# Patient Record
Sex: Female | Born: 1960 | Race: Black or African American | Hispanic: No | Smoking: Never smoker
Health system: Southern US, Community
[De-identification: ages and names within clinical notes are randomized; demographics above are authoritative.]

## PROBLEM LIST (undated history)

## (undated) DIAGNOSIS — F329 Major depressive disorder, single episode, unspecified: Secondary | ICD-10-CM

## (undated) DIAGNOSIS — M549 Dorsalgia, unspecified: Secondary | ICD-10-CM

## (undated) DIAGNOSIS — R531 Weakness: Secondary | ICD-10-CM

## (undated) DIAGNOSIS — K5900 Constipation, unspecified: Secondary | ICD-10-CM

## (undated) DIAGNOSIS — E785 Hyperlipidemia, unspecified: Secondary | ICD-10-CM

## (undated) DIAGNOSIS — R112 Nausea with vomiting, unspecified: Secondary | ICD-10-CM

## (undated) DIAGNOSIS — K219 Gastro-esophageal reflux disease without esophagitis: Secondary | ICD-10-CM

## (undated) DIAGNOSIS — N390 Urinary tract infection, site not specified: Secondary | ICD-10-CM

## (undated) DIAGNOSIS — Z9889 Other specified postprocedural states: Secondary | ICD-10-CM

## (undated) DIAGNOSIS — M62838 Other muscle spasm: Secondary | ICD-10-CM

## (undated) DIAGNOSIS — G43909 Migraine, unspecified, not intractable, without status migrainosus: Secondary | ICD-10-CM

## (undated) DIAGNOSIS — F32A Depression, unspecified: Secondary | ICD-10-CM

## (undated) DIAGNOSIS — Z8669 Personal history of other diseases of the nervous system and sense organs: Secondary | ICD-10-CM

## (undated) DIAGNOSIS — Z8659 Personal history of other mental and behavioral disorders: Secondary | ICD-10-CM

## (undated) DIAGNOSIS — J302 Other seasonal allergic rhinitis: Secondary | ICD-10-CM

## (undated) DIAGNOSIS — G8929 Other chronic pain: Secondary | ICD-10-CM

## (undated) DIAGNOSIS — G47419 Narcolepsy without cataplexy: Secondary | ICD-10-CM

## (undated) DIAGNOSIS — D649 Anemia, unspecified: Secondary | ICD-10-CM

## (undated) HISTORY — PX: EYE SURGERY: SHX253

## (undated) HISTORY — PX: COLONOSCOPY: SHX174

## (undated) HISTORY — PX: MYOMECTOMY: SHX85

## (undated) HISTORY — PX: OTHER SURGICAL HISTORY: SHX169

## (undated) HISTORY — PX: ABDOMINAL HYSTERECTOMY: SHX81

---

## 1998-05-20 ENCOUNTER — Encounter: Admission: RE | Admit: 1998-05-20 | Discharge: 1998-05-20 | Payer: Self-pay | Admitting: Family Medicine

## 1998-06-13 ENCOUNTER — Encounter: Admission: RE | Admit: 1998-06-13 | Discharge: 1998-06-13 | Payer: Self-pay | Admitting: Family Medicine

## 1998-11-21 ENCOUNTER — Encounter: Admission: RE | Admit: 1998-11-21 | Discharge: 1998-11-21 | Payer: Self-pay | Admitting: Family Medicine

## 1998-12-18 ENCOUNTER — Encounter: Admission: RE | Admit: 1998-12-18 | Discharge: 1998-12-18 | Payer: Self-pay | Admitting: Family Medicine

## 1999-11-10 ENCOUNTER — Encounter: Payer: Self-pay | Admitting: Family Medicine

## 1999-11-10 ENCOUNTER — Encounter: Admission: RE | Admit: 1999-11-10 | Discharge: 1999-11-10 | Payer: Self-pay | Admitting: Family Medicine

## 1999-12-25 ENCOUNTER — Encounter: Payer: Self-pay | Admitting: Family Medicine

## 1999-12-25 ENCOUNTER — Encounter: Admission: RE | Admit: 1999-12-25 | Discharge: 1999-12-25 | Payer: Self-pay | Admitting: Family Medicine

## 2000-05-27 ENCOUNTER — Other Ambulatory Visit: Admission: RE | Admit: 2000-05-27 | Discharge: 2000-05-27 | Payer: Self-pay | Admitting: Family Medicine

## 2000-06-11 ENCOUNTER — Encounter: Payer: Self-pay | Admitting: Family Medicine

## 2000-06-11 ENCOUNTER — Ambulatory Visit (HOSPITAL_COMMUNITY): Admission: RE | Admit: 2000-06-11 | Discharge: 2000-06-11 | Payer: Self-pay | Admitting: Family Medicine

## 2000-11-08 ENCOUNTER — Emergency Department (HOSPITAL_COMMUNITY): Admission: EM | Admit: 2000-11-08 | Discharge: 2000-11-09 | Payer: Self-pay | Admitting: Emergency Medicine

## 2000-11-08 ENCOUNTER — Encounter: Payer: Self-pay | Admitting: Emergency Medicine

## 2001-01-18 ENCOUNTER — Encounter: Payer: Self-pay | Admitting: Internal Medicine

## 2001-01-18 ENCOUNTER — Encounter: Admission: RE | Admit: 2001-01-18 | Discharge: 2001-01-18 | Payer: Self-pay | Admitting: Internal Medicine

## 2001-08-30 ENCOUNTER — Encounter: Payer: Self-pay | Admitting: Family Medicine

## 2001-08-30 ENCOUNTER — Ambulatory Visit (HOSPITAL_COMMUNITY): Admission: RE | Admit: 2001-08-30 | Discharge: 2001-08-30 | Payer: Self-pay | Admitting: Family Medicine

## 2001-12-22 ENCOUNTER — Emergency Department (HOSPITAL_COMMUNITY): Admission: EM | Admit: 2001-12-22 | Discharge: 2001-12-23 | Payer: Self-pay | Admitting: Emergency Medicine

## 2002-01-03 ENCOUNTER — Inpatient Hospital Stay (HOSPITAL_COMMUNITY): Admission: RE | Admit: 2002-01-03 | Discharge: 2002-01-05 | Payer: Self-pay | Admitting: *Deleted

## 2002-01-03 ENCOUNTER — Encounter (INDEPENDENT_AMBULATORY_CARE_PROVIDER_SITE_OTHER): Payer: Self-pay | Admitting: Specialist

## 2003-01-02 ENCOUNTER — Encounter: Admission: RE | Admit: 2003-01-02 | Discharge: 2003-02-08 | Payer: Self-pay | Admitting: Sports Medicine

## 2003-03-14 ENCOUNTER — Other Ambulatory Visit: Admission: RE | Admit: 2003-03-14 | Discharge: 2003-03-14 | Payer: Self-pay | Admitting: *Deleted

## 2003-08-09 ENCOUNTER — Emergency Department (HOSPITAL_COMMUNITY): Admission: AD | Admit: 2003-08-09 | Discharge: 2003-08-09 | Payer: Self-pay | Admitting: Family Medicine

## 2003-08-28 ENCOUNTER — Emergency Department (HOSPITAL_COMMUNITY): Admission: EM | Admit: 2003-08-28 | Discharge: 2003-08-28 | Payer: Self-pay | Admitting: Family Medicine

## 2004-02-14 ENCOUNTER — Emergency Department (HOSPITAL_COMMUNITY): Admission: EM | Admit: 2004-02-14 | Discharge: 2004-02-14 | Payer: Self-pay | Admitting: Emergency Medicine

## 2004-05-06 ENCOUNTER — Emergency Department (HOSPITAL_COMMUNITY): Admission: EM | Admit: 2004-05-06 | Discharge: 2004-05-06 | Payer: Self-pay | Admitting: Family Medicine

## 2004-08-29 ENCOUNTER — Emergency Department (HOSPITAL_COMMUNITY): Admission: EM | Admit: 2004-08-29 | Discharge: 2004-08-29 | Payer: Self-pay | Admitting: Family Medicine

## 2004-12-08 ENCOUNTER — Ambulatory Visit: Payer: Self-pay | Admitting: Internal Medicine

## 2004-12-11 ENCOUNTER — Encounter (INDEPENDENT_AMBULATORY_CARE_PROVIDER_SITE_OTHER): Payer: Self-pay | Admitting: *Deleted

## 2004-12-11 LAB — CONVERTED CEMR LAB: Pap Smear: NORMAL

## 2005-01-08 ENCOUNTER — Ambulatory Visit: Payer: Self-pay | Admitting: Internal Medicine

## 2005-01-09 ENCOUNTER — Ambulatory Visit: Payer: Self-pay | Admitting: Internal Medicine

## 2005-01-15 ENCOUNTER — Ambulatory Visit: Payer: Self-pay | Admitting: Internal Medicine

## 2005-02-09 ENCOUNTER — Emergency Department (HOSPITAL_COMMUNITY): Admission: EM | Admit: 2005-02-09 | Discharge: 2005-02-09 | Payer: Self-pay | Admitting: Family Medicine

## 2005-02-17 ENCOUNTER — Emergency Department (HOSPITAL_COMMUNITY): Admission: EM | Admit: 2005-02-17 | Discharge: 2005-02-17 | Payer: Self-pay | Admitting: Family Medicine

## 2005-03-31 ENCOUNTER — Ambulatory Visit: Payer: Self-pay | Admitting: Internal Medicine

## 2005-04-01 ENCOUNTER — Ambulatory Visit (HOSPITAL_COMMUNITY): Admission: RE | Admit: 2005-04-01 | Discharge: 2005-04-01 | Payer: Self-pay | Admitting: Internal Medicine

## 2005-04-14 ENCOUNTER — Ambulatory Visit: Payer: Self-pay | Admitting: *Deleted

## 2005-05-13 ENCOUNTER — Ambulatory Visit: Payer: Self-pay | Admitting: Internal Medicine

## 2005-05-14 ENCOUNTER — Ambulatory Visit (HOSPITAL_COMMUNITY): Admission: RE | Admit: 2005-05-14 | Discharge: 2005-05-14 | Payer: Self-pay | Admitting: Internal Medicine

## 2005-06-02 ENCOUNTER — Ambulatory Visit (HOSPITAL_COMMUNITY): Admission: RE | Admit: 2005-06-02 | Discharge: 2005-06-02 | Payer: Self-pay | Admitting: Family Medicine

## 2005-06-03 ENCOUNTER — Encounter (INDEPENDENT_AMBULATORY_CARE_PROVIDER_SITE_OTHER): Payer: Self-pay | Admitting: *Deleted

## 2005-10-01 ENCOUNTER — Ambulatory Visit: Payer: Self-pay | Admitting: Internal Medicine

## 2005-10-20 ENCOUNTER — Ambulatory Visit: Payer: Self-pay | Admitting: Internal Medicine

## 2005-10-28 ENCOUNTER — Ambulatory Visit: Payer: Self-pay | Admitting: Internal Medicine

## 2005-11-09 ENCOUNTER — Ambulatory Visit (HOSPITAL_BASED_OUTPATIENT_CLINIC_OR_DEPARTMENT_OTHER): Admission: RE | Admit: 2005-11-09 | Discharge: 2005-11-09 | Payer: Self-pay | Admitting: Internal Medicine

## 2005-11-22 ENCOUNTER — Ambulatory Visit: Payer: Self-pay | Admitting: Internal Medicine

## 2005-12-03 ENCOUNTER — Ambulatory Visit: Payer: Self-pay | Admitting: Internal Medicine

## 2005-12-04 ENCOUNTER — Ambulatory Visit: Payer: Self-pay | Admitting: Hospitalist

## 2005-12-04 ENCOUNTER — Ambulatory Visit (HOSPITAL_COMMUNITY): Admission: RE | Admit: 2005-12-04 | Discharge: 2005-12-04 | Payer: Self-pay | Admitting: Hospitalist

## 2005-12-18 ENCOUNTER — Ambulatory Visit: Payer: Self-pay | Admitting: Internal Medicine

## 2005-12-22 ENCOUNTER — Ambulatory Visit (HOSPITAL_COMMUNITY): Admission: RE | Admit: 2005-12-22 | Discharge: 2005-12-22 | Payer: Self-pay | Admitting: Hospitalist

## 2006-01-13 ENCOUNTER — Ambulatory Visit: Payer: Self-pay | Admitting: Internal Medicine

## 2006-02-08 ENCOUNTER — Encounter (INDEPENDENT_AMBULATORY_CARE_PROVIDER_SITE_OTHER): Payer: Self-pay | Admitting: *Deleted

## 2006-02-24 ENCOUNTER — Encounter: Payer: Self-pay | Admitting: Obstetrics and Gynecology

## 2006-02-24 ENCOUNTER — Ambulatory Visit: Payer: Self-pay | Admitting: Obstetrics and Gynecology

## 2006-02-24 ENCOUNTER — Encounter (INDEPENDENT_AMBULATORY_CARE_PROVIDER_SITE_OTHER): Payer: Self-pay | Admitting: *Deleted

## 2006-02-26 DIAGNOSIS — S0230XB Fracture of orbital floor, unspecified side, initial encounter for open fracture: Secondary | ICD-10-CM

## 2006-02-26 DIAGNOSIS — M26609 Unspecified temporomandibular joint disorder, unspecified side: Secondary | ICD-10-CM

## 2006-02-26 DIAGNOSIS — G43909 Migraine, unspecified, not intractable, without status migrainosus: Secondary | ICD-10-CM

## 2006-02-26 DIAGNOSIS — Z8659 Personal history of other mental and behavioral disorders: Secondary | ICD-10-CM | POA: Insufficient documentation

## 2006-02-26 DIAGNOSIS — D259 Leiomyoma of uterus, unspecified: Secondary | ICD-10-CM | POA: Insufficient documentation

## 2006-02-26 DIAGNOSIS — G47419 Narcolepsy without cataplexy: Secondary | ICD-10-CM | POA: Insufficient documentation

## 2006-02-26 DIAGNOSIS — K5900 Constipation, unspecified: Secondary | ICD-10-CM | POA: Insufficient documentation

## 2006-02-26 HISTORY — DX: Leiomyoma of uterus, unspecified: D25.9

## 2006-02-26 HISTORY — DX: Narcolepsy without cataplexy: G47.419

## 2006-02-26 HISTORY — DX: Migraine, unspecified, not intractable, without status migrainosus: G43.909

## 2006-02-26 HISTORY — DX: Fracture of orbital floor, unspecified side, initial encounter for open fracture: S02.30XB

## 2006-02-26 HISTORY — DX: Unspecified temporomandibular joint disorder, unspecified side: M26.609

## 2006-03-11 ENCOUNTER — Ambulatory Visit: Payer: Self-pay | Admitting: Internal Medicine

## 2006-04-12 ENCOUNTER — Ambulatory Visit (HOSPITAL_COMMUNITY): Admission: RE | Admit: 2006-04-12 | Discharge: 2006-04-12 | Payer: Self-pay | Admitting: Obstetrics and Gynecology

## 2006-04-12 ENCOUNTER — Ambulatory Visit: Payer: Self-pay | Admitting: Obstetrics and Gynecology

## 2006-06-14 ENCOUNTER — Ambulatory Visit: Payer: Self-pay | Admitting: Internal Medicine

## 2006-06-14 DIAGNOSIS — N946 Dysmenorrhea, unspecified: Secondary | ICD-10-CM | POA: Insufficient documentation

## 2006-06-14 DIAGNOSIS — J309 Allergic rhinitis, unspecified: Secondary | ICD-10-CM

## 2006-06-14 HISTORY — DX: Allergic rhinitis, unspecified: J30.9

## 2006-06-14 HISTORY — DX: Dysmenorrhea, unspecified: N94.6

## 2006-06-29 ENCOUNTER — Emergency Department (HOSPITAL_COMMUNITY): Admission: EM | Admit: 2006-06-29 | Discharge: 2006-06-29 | Payer: Self-pay | Admitting: Family Medicine

## 2007-01-18 ENCOUNTER — Emergency Department (HOSPITAL_COMMUNITY): Admission: EM | Admit: 2007-01-18 | Discharge: 2007-01-18 | Payer: Self-pay | Admitting: Emergency Medicine

## 2007-01-21 ENCOUNTER — Emergency Department (HOSPITAL_COMMUNITY): Admission: EM | Admit: 2007-01-21 | Discharge: 2007-01-21 | Payer: Self-pay | Admitting: Emergency Medicine

## 2007-03-02 ENCOUNTER — Ambulatory Visit: Payer: Self-pay | Admitting: Hospitalist

## 2007-03-02 DIAGNOSIS — R51 Headache: Secondary | ICD-10-CM | POA: Insufficient documentation

## 2007-03-02 DIAGNOSIS — R519 Headache, unspecified: Secondary | ICD-10-CM

## 2007-03-02 HISTORY — DX: Headache, unspecified: R51.9

## 2007-03-17 ENCOUNTER — Emergency Department (HOSPITAL_COMMUNITY): Admission: EM | Admit: 2007-03-17 | Discharge: 2007-03-17 | Payer: Self-pay | Admitting: Emergency Medicine

## 2007-04-14 ENCOUNTER — Ambulatory Visit: Payer: Self-pay | Admitting: Internal Medicine

## 2007-04-14 ENCOUNTER — Encounter (INDEPENDENT_AMBULATORY_CARE_PROVIDER_SITE_OTHER): Payer: Self-pay | Admitting: Internal Medicine

## 2007-04-14 DIAGNOSIS — IMO0001 Reserved for inherently not codable concepts without codable children: Secondary | ICD-10-CM | POA: Insufficient documentation

## 2007-04-14 DIAGNOSIS — E042 Nontoxic multinodular goiter: Secondary | ICD-10-CM | POA: Insufficient documentation

## 2007-04-14 HISTORY — DX: Nontoxic multinodular goiter: E04.2

## 2007-04-14 LAB — CONVERTED CEMR LAB
Anti Nuclear Antibody(ANA): NEGATIVE
Rheumatoid fact SerPl-aCnc: <20 intl units/mL (ref 0–20)
TSH: 1.177 microintl units/mL (ref 0.350–5.50)
Total CK: 149 units/L (ref 7–177)

## 2007-05-04 ENCOUNTER — Encounter (INDEPENDENT_AMBULATORY_CARE_PROVIDER_SITE_OTHER): Payer: Self-pay | Admitting: Internal Medicine

## 2007-05-04 ENCOUNTER — Ambulatory Visit: Payer: Self-pay | Admitting: Hospitalist

## 2007-05-04 DIAGNOSIS — N39498 Other specified urinary incontinence: Secondary | ICD-10-CM | POA: Insufficient documentation

## 2007-05-04 HISTORY — DX: Other specified urinary incontinence: N39.498

## 2007-05-04 LAB — CONVERTED CEMR LAB
Bilirubin Urine: NEGATIVE
Hemoglobin, Urine: NEGATIVE
Ketones, ur: NEGATIVE mg/dL
Leukocytes, UA: NEGATIVE
Nitrite: NEGATIVE
Protein, ur: NEGATIVE mg/dL
Specific Gravity, Urine: 1.021 (ref 1.005–1.03)
Urine Glucose: NEGATIVE mg/dL
Urobilinogen, UA: 0.2 (ref 0.0–1.0)
pH: 7 (ref 5.0–8.0)

## 2007-05-10 ENCOUNTER — Encounter (HOSPITAL_COMMUNITY): Admission: RE | Admit: 2007-05-10 | Discharge: 2007-08-08 | Payer: Self-pay | Admitting: Internal Medicine

## 2007-05-25 ENCOUNTER — Ambulatory Visit: Payer: Self-pay | Admitting: Obstetrics and Gynecology

## 2007-05-25 ENCOUNTER — Ambulatory Visit: Payer: Self-pay | Admitting: Internal Medicine

## 2007-05-25 DIAGNOSIS — R498 Other voice and resonance disorders: Secondary | ICD-10-CM

## 2007-05-25 HISTORY — DX: Other voice and resonance disorders: R49.8

## 2007-05-27 ENCOUNTER — Telehealth: Payer: Self-pay | Admitting: *Deleted

## 2007-05-30 ENCOUNTER — Ambulatory Visit (HOSPITAL_COMMUNITY): Admission: RE | Admit: 2007-05-30 | Discharge: 2007-05-30 | Payer: Self-pay | Admitting: Internal Medicine

## 2007-06-02 ENCOUNTER — Ambulatory Visit: Payer: Self-pay | Admitting: Internal Medicine

## 2007-06-07 ENCOUNTER — Ambulatory Visit (HOSPITAL_COMMUNITY): Admission: RE | Admit: 2007-06-07 | Discharge: 2007-06-07 | Payer: Self-pay | Admitting: Internal Medicine

## 2007-06-07 ENCOUNTER — Encounter (INDEPENDENT_AMBULATORY_CARE_PROVIDER_SITE_OTHER): Payer: Self-pay | Admitting: Interventional Radiology

## 2007-06-24 ENCOUNTER — Telehealth (INDEPENDENT_AMBULATORY_CARE_PROVIDER_SITE_OTHER): Payer: Self-pay | Admitting: *Deleted

## 2007-07-20 ENCOUNTER — Ambulatory Visit: Payer: Self-pay | Admitting: Hospitalist

## 2007-07-20 DIAGNOSIS — J069 Acute upper respiratory infection, unspecified: Secondary | ICD-10-CM

## 2007-07-20 HISTORY — DX: Acute upper respiratory infection, unspecified: J06.9

## 2007-10-12 ENCOUNTER — Telehealth (INDEPENDENT_AMBULATORY_CARE_PROVIDER_SITE_OTHER): Payer: Self-pay | Admitting: *Deleted

## 2007-10-21 ENCOUNTER — Ambulatory Visit (HOSPITAL_COMMUNITY): Admission: RE | Admit: 2007-10-21 | Discharge: 2007-10-21 | Payer: Self-pay | Admitting: Family Medicine

## 2007-11-07 ENCOUNTER — Other Ambulatory Visit: Admission: RE | Admit: 2007-11-07 | Discharge: 2007-11-07 | Payer: Self-pay | Admitting: Obstetrics and Gynecology

## 2008-10-11 ENCOUNTER — Encounter (INDEPENDENT_AMBULATORY_CARE_PROVIDER_SITE_OTHER): Payer: Self-pay | Admitting: Obstetrics and Gynecology

## 2008-10-11 ENCOUNTER — Inpatient Hospital Stay (HOSPITAL_COMMUNITY): Admission: RE | Admit: 2008-10-11 | Discharge: 2008-10-14 | Payer: Self-pay | Admitting: Obstetrics and Gynecology

## 2008-10-18 ENCOUNTER — Telehealth: Payer: Self-pay | Admitting: *Deleted

## 2008-10-24 ENCOUNTER — Inpatient Hospital Stay (HOSPITAL_COMMUNITY): Admission: AD | Admit: 2008-10-24 | Discharge: 2008-10-26 | Payer: Self-pay | Admitting: Obstetrics and Gynecology

## 2008-10-24 ENCOUNTER — Encounter: Admission: RE | Admit: 2008-10-24 | Discharge: 2008-10-24 | Payer: Self-pay | Admitting: Obstetrics and Gynecology

## 2008-10-25 ENCOUNTER — Ambulatory Visit (HOSPITAL_COMMUNITY): Admission: RE | Admit: 2008-10-25 | Discharge: 2008-10-25 | Payer: Self-pay | Admitting: Obstetrics and Gynecology

## 2008-10-25 ENCOUNTER — Other Ambulatory Visit: Payer: Self-pay | Admitting: Obstetrics and Gynecology

## 2008-10-30 ENCOUNTER — Telehealth (INDEPENDENT_AMBULATORY_CARE_PROVIDER_SITE_OTHER): Payer: Self-pay | Admitting: *Deleted

## 2008-11-19 ENCOUNTER — Ambulatory Visit (HOSPITAL_COMMUNITY): Admission: RE | Admit: 2008-11-19 | Discharge: 2008-11-19 | Payer: Self-pay | Admitting: Family Medicine

## 2009-11-28 ENCOUNTER — Ambulatory Visit (HOSPITAL_COMMUNITY): Admission: RE | Admit: 2009-11-28 | Discharge: 2009-11-28 | Payer: Self-pay | Admitting: Family Medicine

## 2009-12-20 ENCOUNTER — Emergency Department (HOSPITAL_COMMUNITY): Admission: EM | Admit: 2009-12-20 | Discharge: 2009-12-20 | Payer: Self-pay | Admitting: Emergency Medicine

## 2010-06-03 NOTE — Progress Notes (Signed)
Summary: refill/ hla  Phone Note Refill Request Message from:  Fax from Pharmacy on October 18, 2008 3:02 PM  Refills Requested: Medication #1:  AMBIEN 5 MG  TABS take one tablet each night before you go to sleep..   Last Refilled: 8/26 Initial call taken by: Marin Roberts RN,  October 18, 2008 3:03 PM  Follow-up for Phone Call        Rx denied because patient has not been seen here in over a year.  She will need an appointment. Follow-up by: Margarito Liner MD,  October 18, 2008 3:45 PM  Additional Follow-up for Phone Call Additional follow up Details #1::        Rx denial called/faxed to pharmacy Additional Follow-up by: Marin Roberts RN,  October 22, 2008 8:48 AM

## 2010-06-03 NOTE — Progress Notes (Signed)
Summary: RECORDS  Phone Note Call from Patient   Caller: Patient Summary of Call: CALLING IN RE: TO RECORDS BEING COPIED.  SENT [facility] OVER FAX LAST WEEK.  NEEDS TO KNOW IF RECORDS HAVE BEEN COPIED AS SHE NEEDS THEM FOR ANOTHER MD APPT.  ADVISED MESSAGE WOULD BE SENT TO [facility]  PT CAN BE CONTACTED AT [phone] Initial call taken by: Eather Colas PATE CMA,,  October 12, 2007 2:00 PM  Follow-up for Phone Call        I never received an ROI as I have not been the only one pulling faxes. Pt needs to have the MD office send ROI Follow-up by: Knox Royalty,  October 18, 2007 9:35 AM         Appended Document: RECORDS copied by [facility] on 6/23

## 2010-06-03 NOTE — Progress Notes (Signed)
Summary: med refill/gp  Phone Note Refill Request Message from:  Fax from Pharmacy on October 30, 2008 10:53 AM  Refills Requested: Medication #1:  AMBIEN 5 MG  TABS take one tablet each night before you go to sleep..   Last Refilled: 12/28/2007  Method Requested: Telephone to Pharmacy Initial call taken by: Chinita Pester RN,  October 30, 2008 10:53 AM  Follow-up for Phone Call        Refill approved-nurse to complete Follow-up by: Rufina Falco      Prescriptions: AMBIEN 5 MG  TABS (ZOLPIDEM TARTRATE) take one tablet each night before you go to sleep.  #30 x 1   Entered and Authorized by:   Rufina Falco MD   Signed by:   Rufina Falco MD on 10/30/2008   Method used:   Telephoned to ...       [facility]  [address] [facility]       [address]       [place], [place]  [address]       Ph: [phone]       Fax: [phone]   RxID:   [id number]   Appended Document: med refill/gp Above Rx faxed to [facility] pharmacy.
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
## 2010-06-03 NOTE — Assessment & Plan Note (Signed)
Summary: FU/ SB.   Vital Signs:  Patient Profile:   50 Years Old Female Height:     67 inches (170.18 cm) Weight:      155.1 pounds Temp:     98.4 degrees F oral Pulse rate:   72 / minute BP sitting:   105 / 66  (right arm)  Pt. in pain?   yes    Location:   back    Intensity:   4    Type:       aching  Vitals Entered ByFilomena Jungling (May 04, 2007 10:55 AM)              Is Patient Diabetic? No  Does patient need assistance? Functional Status Self care Ambulation Normal     PCP:  Rufina Falco MD  Chief Complaint:  FOLLOW-UP VISIT.  History of Present Illness: Here today for f/u on her goiter. Euthyroid by thyroid function tests. Here to discuss further management. Has had minor difficulties with swallowing. For the past 2 months has been experiencing urinary incontinence with laughing and singing. Also has dysuria.  Current Allergies: No known allergies     Risk Factors: Tobacco use:  quit    Year quit:  2007 Alcohol use:  yes    Type:  BEER, LIQUOR/ AT TIMES Exercise:  no Seatbelt use:  100 %  PAP Smear History:    Date of Last PAP Smear:  12/11/2004   Review of Systems  The patient denies fever, weight loss, chest pain, dyspnea on exhertion, abdominal pain, melena, hematochezia, and severe indigestion/heartburn.     Physical Exam  General:     Well-developed,well-nourished,in no acute distress; alert,appropriate and cooperative throughout examination Eyes:     No corneal or conjunctival inflammation noted. EOMI. Perrla.  Vision grossly normal. Neck:     No bruits. Large goiter. Lungs:     Normal respiratory effort, chest expands symmetrically. Lungs are clear to auscultation, no crackles or wheezes. Heart:     Normal rate and regular rhythm. S1 and S2 normal without gallop, murmur, click, rub or other extra sounds. Extremities:     No clubbing, cyanosis, edema.    Impression & Recommendations:  Problem # 1:  STRESS INCONTINENCE  (ICD-788.39) Will check a U/A first as this may be a sign of a UTI.  Orders: T-Urinalysis ([id number]) T-Culture, Urine ([id number])   Problem # 2:  NONTOXIC MULTINODULAR GOITER (ICD-241.1) FNA is not the next best step as she has at least 5 nodules noted on CT scan. Euthyroid. Will proceed to thyroid scan to r/o any cold nodules. Will also follow closely in regards to symptomes because if she develops any compressing symptoms will need to refer urgently to surgery. Airway patent on CT scan in 11/08.  Future Orders: Nuclear Medicine (Nuclear Medicine) ... 05/10/2007   Complete Medication List: 1)  Imitrex 50 Mg Tabs (Sumatriptan succinate) .... Take one tablet as needed with a maxmum dose of 2 pills every day 2)  Flexeril 5 Mg Tabs (Cyclobenzaprine hcl) .... Take 1 tab by mouth at bedtime   Patient Instructions: 1)  Please schedule a follow-up appointment in 1 month.    ]
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
## 2010-06-03 NOTE — Progress Notes (Signed)
Summary: refill/gg    Amber Riley  Phone Note Call from Patient   Caller: Patient Summary of Call: Pt called and would like Rx  for sleeping pill. please advise Initial call taken by: Merrie Roof RN,  June 24, 2007 3:00 PM  Follow-up for Phone Call        Please call in a prescription for Ambien 5 mg by mouth q hs.  #30 1 refill Follow-up by: Rufina Falco    New/Updated Medications: AMBIEN 5 MG  TABS (ZOLPIDEM TARTRATE) take one tablet each night before you go to sleep.   Prescriptions: AMBIEN 5 MG  TABS (ZOLPIDEM TARTRATE) take one tablet each night before you go to sleep.  #30 x 1   Entered and Authorized by:   Rufina Falco MD   Signed by:   Rufina Falco MD on 06/28/2007   Method used:   Telephoned to ...         RxID:   [id number]     Appended Document: refill/gg    [name] med called into [facility]
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
## 2010-06-03 NOTE — Progress Notes (Signed)
----   Converted from flag ---- ---- 05/27/2007 9:18 AM, Chinita Pester RN wrote: Talked to pt. this morning and explain Amber Riley in radiology stated there's a review that needs to be done prior to scheduling her  an appt.  ---- 05/26/2007 5:25 PM, Merrie Roof RN wrote: Please call Tennile at [phone]. She has questions about when her Bx will be done. Thanks, Inocencio Homes ------------------------------

## 2010-07-29 ENCOUNTER — Emergency Department (HOSPITAL_COMMUNITY)
Admission: EM | Admit: 2010-07-29 | Discharge: 2010-07-30 | Disposition: A | Discharge status: Home / Self Care | Payer: BC Managed Care – PPO | Attending: Emergency Medicine | Admitting: Emergency Medicine

## 2010-07-29 ENCOUNTER — Emergency Department (HOSPITAL_COMMUNITY): Payer: BC Managed Care – PPO

## 2010-07-29 DIAGNOSIS — R079 Chest pain, unspecified: Secondary | ICD-10-CM | POA: Insufficient documentation

## 2010-07-29 LAB — DIFFERENTIAL
Basophils Absolute: 0 10*3/uL (ref 0.0–0.1)
Basophils Relative: 1 % (ref 0–1)
Eosinophils Absolute: 0.1 10*3/uL (ref 0.0–0.7)
Eosinophils Relative: 1 % (ref 0–5)
Lymphocytes Relative: 68 % — ABNORMAL HIGH (ref 12–46)
Lymphs Abs: 4.7 10*3/uL — ABNORMAL HIGH (ref 0.7–4.0)
Monocytes Absolute: 0.4 10*3/uL (ref 0.1–1.0)
Monocytes Relative: 6 % (ref 3–12)
Neutro Abs: 1.7 10*3/uL (ref 1.7–7.7)
Neutrophils Relative %: 25 % — ABNORMAL LOW (ref 43–77)

## 2010-07-29 LAB — CBC
HCT: 43.5 % (ref 36.0–46.0)
Hemoglobin: 14.2 g/dL (ref 12.0–15.0)
MCH: 28.3 pg (ref 26.0–34.0)
MCHC: 32.6 g/dL (ref 30.0–36.0)
MCV: 86.7 fL (ref 78.0–100.0)
Platelets: 205 10*3/uL (ref 150–400)
RBC: 5.02 MIL/uL (ref 3.87–5.11)
RDW: 12.7 % (ref 11.5–15.5)
WBC: 6.9 10*3/uL (ref 4.0–10.5)

## 2010-07-29 LAB — POCT I-STAT, CHEM 8
BUN: 16 mg/dL (ref 6–23)
Calcium, Ion: 1.17 mmol/L (ref 1.12–1.32)
Chloride: 105 mEq/L (ref 96–112)
Creatinine, Ser: 1.1 mg/dL (ref 0.4–1.2)
Glucose, Bld: 88 mg/dL (ref 70–99)
HCT: 46 % (ref 36.0–46.0)
Hemoglobin: 15.6 g/dL — ABNORMAL HIGH (ref 12.0–15.0)
Potassium: 5 mEq/L (ref 3.5–5.1)
Sodium: 137 mEq/L (ref 135–145)
TCO2: 24 mmol/L (ref 0–100)

## 2010-07-29 LAB — POCT CARDIAC MARKERS
CKMB, poc: 2 ng/mL (ref 1.0–8.0)
Myoglobin, poc: 85.1 ng/mL (ref 12–200)
Troponin i, poc: <0.05 ng/mL (ref 0.00–0.09)

## 2010-08-11 LAB — CBC
HCT: 34.6 % — ABNORMAL LOW (ref 36.0–46.0)
HCT: 30.1 % — ABNORMAL LOW (ref 36.0–46.0)
HCT: 37.5 % (ref 36.0–46.0)
Hemoglobin: 11.4 g/dL — ABNORMAL LOW (ref 12.0–15.0)
Hemoglobin: 10.3 g/dL — ABNORMAL LOW (ref 12.0–15.0)
Hemoglobin: 12.7 g/dL (ref 12.0–15.0)
MCHC: 32.8 g/dL (ref 30.0–36.0)
MCHC: 33.7 g/dL (ref 30.0–36.0)
MCHC: 34.2 g/dL (ref 30.0–36.0)
MCV: 86.6 fL (ref 78.0–100.0)
MCV: 86.4 fL (ref 78.0–100.0)
MCV: 86.9 fL (ref 78.0–100.0)
Platelets: 390 10*3/uL (ref 150–400)
Platelets: 159 10*3/uL (ref 150–400)
Platelets: 204 10*3/uL (ref 150–400)
RBC: 3.99 MIL/uL (ref 3.87–5.11)
RBC: 3.49 MIL/uL — ABNORMAL LOW (ref 3.87–5.11)
RBC: 4.32 MIL/uL (ref 3.87–5.11)
RDW: 14.9 % (ref 11.5–15.5)
RDW: 15.2 % (ref 11.5–15.5)
RDW: 15.4 % (ref 11.5–15.5)
WBC: 10.1 10*3/uL (ref 4.0–10.5)
WBC: 11.5 10*3/uL — ABNORMAL HIGH (ref 4.0–10.5)
WBC: 5 10*3/uL (ref 4.0–10.5)

## 2010-08-11 LAB — DIFFERENTIAL
Basophils Absolute: 0.1 10*3/uL (ref 0.0–0.1)
Basophils Relative: 1 % (ref 0–1)
Eosinophils Absolute: 0.1 10*3/uL (ref 0.0–0.7)
Eosinophils Relative: 1 % (ref 0–5)
Lymphocytes Relative: 27 % (ref 12–46)
Lymphs Abs: 2.7 10*3/uL (ref 0.7–4.0)
Monocytes Absolute: 0.3 10*3/uL (ref 0.1–1.0)
Monocytes Relative: 3 % (ref 3–12)
Neutro Abs: 6.9 10*3/uL (ref 1.7–7.7)
Neutrophils Relative %: 69 % (ref 43–77)

## 2010-08-11 LAB — BASIC METABOLIC PANEL
BUN: 8 mg/dL (ref 6–23)
CO2: 26 mEq/L (ref 19–32)
Calcium: 9 mg/dL (ref 8.4–10.5)
Chloride: 108 mEq/L (ref 96–112)
Creatinine, Ser: 0.9 mg/dL (ref 0.4–1.2)
GFR calc Af Amer: >60 mL/min (ref >60)
GFR calc non Af Amer: >60 mL/min (ref >60)
Glucose, Bld: 71 mg/dL (ref 70–99)
Potassium: 3.4 mEq/L — ABNORMAL LOW (ref 3.5–5.1)
Sodium: 139 mEq/L (ref 135–145)

## 2010-08-11 LAB — CULTURE, BLOOD (ROUTINE X 2)
Culture: NO GROWTH
Culture: NO GROWTH

## 2010-08-11 LAB — ANAEROBIC CULTURE

## 2010-08-11 LAB — TYPE AND SCREEN
ABO/RH(D): O POS
Antibody Screen: NEGATIVE

## 2010-08-11 LAB — URINALYSIS, ROUTINE W REFLEX MICROSCOPIC
Bilirubin Urine: NEGATIVE
Glucose, UA: NEGATIVE mg/dL
Hgb urine dipstick: NEGATIVE
Ketones, ur: NEGATIVE mg/dL
Nitrite: NEGATIVE
Protein, ur: NEGATIVE mg/dL
Specific Gravity, Urine: 1.015 (ref 1.005–1.030)
Urobilinogen, UA: 0.2 mg/dL (ref 0.0–1.0)
pH: 6.5 (ref 5.0–8.0)

## 2010-08-11 LAB — APTT: aPTT: 38 seconds — ABNORMAL HIGH (ref 24–37)

## 2010-08-11 LAB — PROTIME-INR
INR: 1.1 (ref 0.00–1.49)
Prothrombin Time: 14.4 seconds (ref 11.6–15.2)

## 2010-08-11 LAB — PREGNANCY, URINE: Preg Test, Ur: NEGATIVE

## 2010-08-11 LAB — CULTURE, ROUTINE-ABSCESS: Culture: NO GROWTH

## 2010-08-11 LAB — ABO/RH: ABO/RH(D): O POS

## 2010-09-16 NOTE — Discharge Summary (Signed)
Amber Riley, HIRD NO.:  [id number]   MEDICAL RECORD NO.:  [id number]          PATIENT TYPE:  WOC   LOCATION:  WOC                          FACILITY:  [facility]   PHYSICIAN:  Maylon Cos, C.N.M. DATE OF BIRTH:  [birth year]   DATE OF ADMISSION:  05/25/2007  DATE OF DISCHARGE:                               DISCHARGE SUMMARY   The patient is an African American female, age 50, who presents with  complaints of discharge and leaking urine.  The patient has a long-  standing history of chronic bacterial vaginosis and feels that this is  what she has again today. As a new complaint, the patient has a  complaint of some stress incontinence.  She says it does not happen on a  weekly or monthly basis.  The last time it occurred was around the  holidays, when if she coughed too hard or laughed too much, she will  leak urine, even after recent urination.  The patient's exam today is  problem focused.  Vital signs are stable.  Her temperature is 97.9, her  pulse is 92, blood pressure is 107/67.  Her weight today is 156.8 and  height is 5 feet 6 inches.  She is not allergic to any medications.  Her  last menstrual period was May 17, 2007.  She reports regular  periods.  Her last Pap smear was in October of 2007 and it was normal.  The patient is a well-nourished, African American female who appears to  be her stated age.  SPECULUM EXAM:  She had a large amount of yellow, creamy discharge in  the vault, which was obtained for a wet prep.  The cervix is smooth  without lesions.  EXTERNAL GENITALIA:  Without lesions.  She is a Tanner 5.  There is no  evidence of cystocele or rectocele.  The patient has good tone, regular  rugae.   ASSESSMENT AND PLAN:  Wet prep revealed the patient does have another  case of bacterial vaginosis.  She was given a prescription for  metronidazole gel to use 1 applicator full nightly times 7 nights,  monthly at her periods times 3 months.  The  patient's urine analysis  indicated that there could be evidence of a urinary tract infection and  the urine was sent for culture.  The patient is asymptomatic for pain or  burning with urination, so we are not treating her with antibiotics at  this time.  However, culture was obtained and sent.  Health maintenance-  wise, the patient is to return in 3 months to re-evaluate her progress  with the new regimen of medication dosing and also to perform her annual  exam and Pap smear.  She is without insurance coverage at this time, so  she was referred to a family planning waiver at the [employer] and also made aware of the scholarship program for mammograms  and the free cervical cancer screening that will be happening on  February 16 at the [facility].  For stress incontinence, the  patient does seem to have a very mild case of stress  incontinence.  Urine culture was sent to rule out infection and the patient was given  instructions on Kegel exercises to help strengthen pelvic floor muscles.      Maylon Cos, C.N.M.     SS/MEDQ  D:  05/25/2007  T:  05/25/2007  Job:  [id number]

## 2010-09-16 NOTE — Discharge Summary (Signed)
Amber Riley, Amber Riley NO.:  [id number]   MEDICAL RECORD NO.:  [id number]          PATIENT TYPE:  INP   LOCATION:  9306                          [facility]ILITY:  [facility]   PHYSICIAN:  Gerald Leitz, MD          DATE OF BIRTH:  1960-08-02   DATE OF ADMISSION:  10/24/2008  DATE OF DISCHARGE:  10/26/2008                               DISCHARGE SUMMARY   ADMISSION DIAGNOSES:  1. Postoperative fever.  2. Pelvic hematoma versus abscess.   DISCHARGE DIAGNOSES:  1. Pelvic hematoma versus abscess.  2. Status post transgluteal drain placement.   BRIEF HOSPITAL COURSE:  This is a 50 year old who underwent total  abdominal hysterectomy, left salpingo-oophorectomy, lysis of adhesions,  repair of colonic postoperative defect on October 14, 2008.  She presented  to my office with a temperature of 100.4, had a CT scan performed that  showed suspicion for postoperative abscess, a transgluteal drain was  placed on October 25, 2008, by Interventional Radiology.  She was given  prophylactic dose of Flagyl upon admission to the hospital.  Aerobic and  anaerobic cultures were obtained during drain placement.  She did well,  status post drain placement, and was discharged home on hospital day #3,  which is October 26, 2008, with improvement of her pain with defecation.   She was discharged home on the following medications, Cipro, Flagyl,  Motrin, and Vicodin for pain.  She is to follow up on October 30, 2008, at  9:45 a.m. with Dr. Richardson Dopp.  Home health was arranged to give instructions  on management of her transgluteal drain.   CONDITION ON DISCHARGE:  Stable and improved.      Gerald Leitz, MD  Electronically Signed     TC/MEDQ  D:  11/07/2008  T:  11/08/2008  Job:  [id number]

## 2010-09-16 NOTE — Discharge Summary (Signed)
NAMEADDALINE, Amber Riley NO.:  [id number]   MEDICAL RECORD NO.:  [id number]          PATIENT TYPE:  INP   LOCATION:  9302                          [facility]   PHYSICIAN:  Gerald Leitz, MD          DATE OF BIRTH:  11-29-1960   DATE OF ADMISSION:  10/11/2008  DATE OF DISCHARGE:  10/14/2008                               DISCHARGE SUMMARY   ADMISSION DIAGNOSES:  1. Menorrhagia.  2. Uterine fibroids.   DISCHARGE DIAGNOSES:  1. Menorrhagia.  2. Uterine fibroids.  3. Pelvic adhesive disease, status post total abdominal hysterectomy,      left salpingo-oophorectomy, extensive lysis of adhesions, repair of      colonic serosal defect.   BRIEF HOSPITAL COURSE:  The patient was admitted on October 11, 2008, after  undergoing a total abdominal hysterectomy, left salpingo-oophorectomy,  extensive lysis of adhesions, repair of colonic serosal defect.  She did  well postoperatively.  Hemoglobin on postop day #1 was 10.3.  She is  discharged in stable condition with return of her bowel function,  tolerating regular diet, voiding without difficulty.  She is discharged  home the following medications Motrin and Percocet.   She will follow up in October 15, 2008, for staple removal and in 2 weeks  for postoperative visit.   CONDITION ON DISCHARGE:  Stable and improved.      Gerald Leitz, MD  Electronically Signed     TC/MEDQ  D:  10/14/2008  T:  10/14/2008  Job:  [id number]

## 2010-09-16 NOTE — Discharge Summary (Signed)
Amber Riley, Amber Riley NO.:  [id number]   MEDICAL RECORD NO.:  [id number]          PATIENT TYPE:  INP   LOCATION:  9302                          FACILITY:  [facility]   PHYSICIAN:  Gerald Leitz, MD          DATE OF BIRTH:  May 03, 1961   DATE OF ADMISSION:  10/11/2008  DATE OF DISCHARGE:  10/14/2008                               DISCHARGE SUMMARY   ADMISSION DIAGNOSES:  1. Menorrhagia.  2. Uterine fibroids.   DISCHARGE DIAGNOSES:  1. Menorrhagia.  2. Uterine fibroids.  3. Pelvic adhesive disease.  4. Status post total abdominal hysterectomy, left salpingo-      oophorectomy, extensive lysis of adhesions, and repair of colonic      serosal defect.   BRIEF HOSPITAL COURSE:  The patient was admitted on October 11, 2008, after  undergoing a total abdominal hysterectomy, left salpingo-oophorectomy  and lysis of adhesions, and repair of colonic serosal defect.  She did  well postoperatively.  Hemoglobin on postop day #1 was 10.3.  She had  slow return of her bowel function, but was discharged home on postop day  #3 with positive flatus.  She was discharged home on the following  medications, Motrin and Percocet.  She was advised no driving for 2  weeks, no lifting for 6 weeks, no sexual activity for 6 weeks.  She will  follow up for staple removal on October 15, 2008, at a 2-week postoperative  visit.   CONDITION AT DISCHARGE:  Stable and improved.      Gerald Leitz, MD  Electronically Signed     TC/MEDQ  D:  11/07/2008  T:  11/08/2008  Job:  [id number]

## 2010-09-16 NOTE — Op Note (Signed)
Amber Riley, Amber Riley NO.:  [id number]   MEDICAL RECORD NO.:  [id number]          PATIENT TYPE:  INP   LOCATION:  9302                          [facility]ACILITY[facility]   PHYSICIAN:  Gerald Leitz, MD          DATE OF BIRTH:  [birth year]   DATE OF PROCEDURE:  10/11/2008  DATE OF DISCHARGE:                               OPERATIVE REPORT   PREOPERATIVE DIAGNOSES:  1. Menorrhagia.  2. Uterine fibroids.   POSTOPERATIVE DIAGNOSES:  1. Menorrhagia.  2. Uterine fibroids.  3. Pelvic adhesive disease.   PROCEDURE:  Total abdominal hysterectomy, left salpingo-oophorectomy  with extensive lysis of adhesions and repair of colonic serosal defect.   SURGEON:  Gerald Leitz, MD   ASSISTANT:  Bing Neighbors. Delcambre, MD   ANESTHESIA:  General.   FINDINGS:  Pelvic adhesive disease with adhesion of the left ovary to  the sigmoid colon and uterus, areas of endometriosis were noted.  Right  ovary and fallopian tube appeared normal.   SPECIMEN:  Uterus, cervix, and left fallopian tube and ovary.   DISPOSITION:  Specimen to Pathology.   SPECIMEN:  300 mL.   URINE OUTPUT:  375 mL.   COMPLICATIONS:  None.   DESCRIPTION OF PROCEDURE:  Informed consent was obtained, the patient  was taken to the operating room where she was placed under general  anesthesia.  She was prepped and draped in the usual sterile fashion.  A  Pfannenstiel skin incision was made with a scalpel, carried down to the  underlying layer of fascia.  The fascia was incised in the midline and  the incision was extended laterally with Mayo scissors.  The superior  aspect of the fascial incision was grasped with Kocher clamps, elevated,  and the underlying rectus muscle dissected off with Mayo scissors.  This  was repeated on the inferior aspect of the fascial incision.  The rectus  muscles were separated in the midline.  The peritoneum was identified,  tented up, and entered sharply with Metzenbaum scissors.  The pelvis was  examined with the findings noted above.  Balfour retractor was placed  into the abdominal cavity.  One moist sponge was used to retract the  bowel at that point the patient was noted to have adhesions of the left  ovary to the sigmoid colon.  This was released via sharp dissection.  A  serosal defect in the sigmoid colon was then repaired with 2-0 silk ties  in interrupted fashion.  These serosal tear was approximately 2 cm in  length.  The rest of bowel was then packed with moist laparotomy  sponges.  Cornu of the uterus were grasped with Kelly clamps.  The round  ligaments were suture ligated with 0 Vicryl and transected bilaterally.  The vesicouterine peritoneum was incised in the midline bilaterally and  the bladder was dissected off the cervix with Metzenbaum scissors and a  sponge stick.  Attention was turned to the  right utero-ovarian ligament  which was clamped with a Zeppelin clamp, transected, ligated with a free  tie of 0 Vicryl followed by suture ligature of 0 Vicryl.  Attention was  then turned to the left utero-ovarian ligament.  The ovary on the left  seems to be compromised.  The blood flow was compromised from previous  dissection and decision was made to remove the left ovary and the ureter  was identified on the left and the infundibulopelvic ligament was then  clamped with a Zeppelin clamp, transected, suture ligated with free tie  of 0 Vicryl followed by suture ligature of 0 Vicryl.  The uterine  arteries were skeletonized bilaterally.  They were then clamped with  Heaney clamps, transected, and suture ligated with 0 Vicryl.  Uterosacral ligaments were then clamped, transected, suture ligated with  0 Vicryl.  The cervix was amputated with Mayo scissors.  The vaginal  cuff angles were repaired with 0 Vicryl.  The vaginal cuff was repaired  with running stitch of 0 Vicryl.  The abdomen was then copiously  irrigated.  The pedicles were examined and found to be  hemostatic.  Moist sponges and instruments were removed from the abdomen.  The fascia  was reapproximated with 0 PDS.  The skin was closed with staples.  Sponge, lap, and needle counts were correct x2.  The patient was taken  to the recovery room awake and stable condition.      Gerald Leitz, MD  Electronically Signed     TC/MEDQ  D:  10/11/2008  T:  10/11/2008  Job:  [id number]

## 2010-09-19 NOTE — Op Note (Signed)
[name], Amber Riley          ACCOUNT NO.:  [id number]   MEDICAL RECORD NO.:  [id number]          PATIENT TYPE:  AMB   LOCATION:  SDC                           [facility]ACILITY:  [facility]   PHYSICIAN:  Phil D. Okey Dupre, M.D.     DATE OF BIRTH:  June 06, 1960   DATE OF PROCEDURE:  04/12/2006  DATE OF DISCHARGE:                               OPERATIVE REPORT   The procedure was diagnostic laparoscopy.   PREOPERATIVE DIAGNOSIS:  Chronic pelvic pain with some small fibroid  tumors.   POSTOPERATIVE DIAGNOSIS:  Chronic pelvic pain with some small fibroid  tumors, plus significant pelvic adhesions   SURGEON:  Dr. Okey Dupre.   ANESTHESIA:  General.   ESTIMATED BLOOD LOSS:  Minimal.   SPECIMENS TO PATHOLOGY:  Zero.   POSTOPERATIVE CONDITION:  Satisfactory.   OPERATIVE FINDINGS:  On entering the peritoneal cavity, pelvic organs  easily visualized.  The uterus was markedly irregular, approximately 8-  weeks gestational size with multiple intramural and subserosal  leiomyomata, a left hydrosalpinx.  The ovaries were normal.  There were  marked adhesions, some filmy, some thick, from the posterior wall of the  uterus to the bowel in several areas which probably explains the  patient's defecation problem.   The procedure went as follows:  Under satisfactory general anesthesia,  the patient in dorsal semilithotomy position, perineum, vagina and  abdomen prepped and draped in usual sterile manner.  A ____________ clip  was placed on the cervix up into the uterus with mobilization of the  uterus after the Graves speculum was used to visualize the cervix.  Graves speculum was removed.  A 1-cm transverse incision made just below  the umbilicus.  The Veress needle inserted the peritoneal cavity,  approximately 3 liters of carbon dioxide slowly insufflated into the  peritoneal cavity with tympany heard over the entire abdominal wall.  The needle was removed.  The laparoscopic trocar inserted.  Trocar  removed  from the sleeve.  Laparoscope inserted, and the findings were as  above.  Scope was removed.  As much CO2 as possible was expressed from  the sleeve and the sleeve removed, and the incision was closed with a 3-  0 Vicryl suture making sure we went through the peritoneum and fascia.  There was a small lateral  bleeder that was caught a figure-of-eight suture and controlled, and a  subcuticular closure was carried out.  Dry sterile dressing was applied.  The tenaculum removed from the cervix.  The patient transferred to  recovery room in satisfactory condition, having tolerated the procedure  well.           ______________________________  Javier Glazier. Okey Dupre, M.D.     PDR/MEDQ  D:  04/12/2006  T:  04/12/2006  Job:  [id number]

## 2010-09-19 NOTE — Discharge Summary (Signed)
NAME:  [name], CURLING NO.:  [id number]   MEDICAL RECORD NO.:  [id number]                   PATIENT TYPE:  EMS   LOCATION:  MINO                                 FACILITY:  [facility]   PHYSICIAN:  Tracie Harrier, M.D.              DATE OF BIRTH:  29-Aug-1960   DATE OF ADMISSION:  01/03/2002  DATE OF DISCHARGE:  01/05/2002                                 DISCHARGE SUMMARY   HISTORY OF PRESENT ILLNESS:  The patient is a 50 year old female G2, P2  admitted for multiple myomectomy due to symptomatic uterine fibroids.  The  patient has had abnormal bleeding with her uterine fibroids and some pelvic  discomfort.  She was counseled regarding different treatment options for  uterine fibroids and has requested myomectomy.  The patient was thoroughly  counseled regarding this procedure and its limitations.  Also she declined  tubal ligation.   MEDICAL HISTORY:  1. History of TMJ.  2. History of migraine headaches.  3. History of uterine fibroids-symptomatic.   PAST SURGICAL HISTORY:  1. Eye surgery.  2. Bilateral foot surgery.   OB HISTORY:  Normal spontaneous vaginal delivery x two at term.   MEDICATIONS:  1. Effexor.  2. Zomig.  3. Oral contraception.   ALLERGIES:  PREDNISONE.   PHYSICAL EXAMINATION:  Please see admission History and Physical.   ADMISSION DIAGNOSES:  Symptomatic uterine fibroids.   HOSPITAL COURSE:  The patient was admitted on same day of surgery January 03, 2002 where she underwent a multiple myomectomy.  The surgery went well  and was uneventful.  Six uterine fibroids were removed.  Blood loss was 150  cc.   The patient's postoperative course was uneventful.  She was slowly advanced  to a regular diet, which she was tolerating well prior to discharge.  Her  hemoglobin stabilized at 8.9 and she was asymptomatic with this degree of  anemia.  She quickly resumed normal bowel and bladder function and was  ambulating without  difficulty.  Her incision was clean, dry and intact.  She  was routinely discharged postoperative day 2 in stable condition.   DISCHARGE DIAGNOSES:  1. Status post multiple myomectomy.  2. Anemia-stable.   PLAN:  1. Home.  2. Routine instructions given.  3. No heavy lifting or driving a car for two weeks.  4. Nothing per vagina for six weeks.  5. Follow up in the office on Monday to remove staples and for further     postsurgical evaluation.  6. Iron will be started on Monday for patient's anemia.  She is asymptomatic     with this degree of anemia.                                               Tracie Harrier, M.D.  REG/MEDQ  D:  02/03/2002  T:  02/05/2002  Job:  [id number]

## 2010-09-19 NOTE — Group Therapy Note (Signed)
NAME:  Amber Riley, Amber Riley NO.:  [id number]   MEDICAL RECORD NO.:  [id number]          PATIENT TYPE:  WOC   LOCATION:  [facility]                   FACILITY:  [facility]   PHYSICIAN:  Argentina Donovan, MD        DATE OF BIRTH:  [birth year]   DATE OF SERVICE:                                    CLINIC NOTE   GYN CLINIC VISIT:   HISTORY OF PRESENT ILLNESS:  The patient is a 50 year old African American  female, gravida 2, para 2-0-0-2, who comes in complaining of severe  disabling dysmenorrhea and pain with defecation.  She had a myomectomy about  2 years ago because of abnormal bleeding and pain.  The pain has gotten  worse over the past year.  She had a recent ultrasound which showed two  small 4-cm fibroid tumors.  No sign of adenomyosis or endometriosis could be  seen.  The pain is more severe during her period, but she sometimes has the  pain of defecation in between.  I told her we would go ahead and schedule  her for a laparoscopy to see if we could find any anatomical reason for the  pain.  In the meanwhile, I thought we would try her on some Loestrin 24 FE  to see whether or not that, in combination with Motrin 600 to control  prostaglandin excretion, might relieve some of her problem.  We discussed  with her in detail the mechanisms of endometriosis, adenomyosis and  prostaglandin.  Impression is severe disabling dysmenorrhea and pain of  defecation, especially with her period.   PHYSICAL EXAMINATION:  ABDOMEN:  Soft, flat, nontender.  No masses, no  organomegaly.  There is a surgical scar transverse above the suprapubic bone  from her previous myomectomy.  PELVIC:  External genitalia is normal.  BUS is within normal limits.  Vagina  is clean and well rugated.  Cervix is clean and parous.  Uterus is anterior,  slightly irregular, but not significant enlarged size, and adnexa could not  be well palpated, although reported as normal on her ultrasound.   IMPRESSION:   Severe disabling dysmenorrhea.   PLAN:  We are going to schedule her for a laparoscopy.  The patient will  call and cancel if the birth control pills and Motrin give significant aid.           ______________________________  Argentina Donovan, MD     PR/MEDQ  D:  02/24/2006  T:  02/25/2006  Job:  [id number]

## 2010-09-19 NOTE — Procedures (Signed)
[name], Amber Riley NO.:  [id number]   MEDICAL RECORD NO.:  [id number]          PATIENT TYPE:  OUT   LOCATION:  SLEEP CENTER                 FACILITY:  [facility]   PHYSICIAN:  Clinton D. Maple Hudson, M.D. DATE OF BIRTH:  [birth year]   DATE OF STUDY:  11/09/2005                            MULTIPLE SLEEP LATENCY TEST   INDICATION FOR STUDY:  Narcolepsy with hypersomnia. A nocturnal  polysomnogram was performed on November 09, 2005, recording 335 minutes of sleep  of which 21% was REM with an AHI of 3.9/hour and a PLMA of 2.1/hour.  No  medications were taken on either day.   EPWORTH SLEEPINESS SCORE:  20/24.   BMI:   MEDICATIONS:  NAP TIMES               SLEEP LATENCY           REM LATENCY  08:00 am                      1.5 minutes             NA  10:16 am                      1.5 minutes             10 minutes  12:33 pm                      20 minutes              NA  02:53 pm                      1 minutes               4 minutes  05:20 pm                      1.5 minutes             NA    MEAN SLEEP LATENCY:  5.1 minutes   NUMBER OF REM EPISODES:  Two sleep onset REM events.   COMMENTS:   IMPRESSIONS-RECOMMENDATIONS:  This study is indicative of severe daytime  hypersomnolence together with recognition of REM during two of the naps.  Criteria are met for a diagnosis of narcolepsy if that is consistent with  clinical findings. Is there clinical history of cataplexy or sleep  paralysis?      Clinton D. Maple Hudson, M.D.  Diplomate, Biomedical engineer of Sleep Medicine  Electronically Signed     CDY/MEDQ  D:  11/22/2005 11:44:01  T:  11/22/2005 22:35:07  Job:  [id number]

## 2010-09-19 NOTE — Group Therapy Note (Signed)
Amber Riley, Amber Riley NO.:  [id number]   MEDICAL RECORD NO.:  [id number]          PATIENT TYPE:  WOC   LOCATION:  [facility]                   FACILITY:  [facility]   PHYSICIAN:  Ellis Parents, MD    DATE OF BIRTH:  June 26, 1960   DATE OF SERVICE:                                    CLINIC NOTE   This a 50 year old gravida 2, para 2, last menstrual period March 23, 2005, comes in complaining of vaginal irritation, not necessarily pruritus,  but associated with some moderately foul odor.  She had an episode of this  approximately six months ago and was treated with metronidazole for seven  days, and it cleared the problem.  The patient has normal menstrual periods  and has no other gynecologic complaints.  Her last Pap smear was in October  and was normal.   PHYSICAL EXAMINATION:  The vagina contains a thin, yellowish secretion that  is __________ positive.  Wet prep:  Numerous clue cells.  No TRICH present.  Bimanual exam reveals the uterus to be irregularly enlarged, approximately 8  weeks' size, with some small leiomyoma.  Both adnexa are soft without  induration, masses, or nodularity.   IMPRESSION:  Bacterial vaginosis.  The patient is being treated with  metronidazole 500 mg b.i.d. for seven days.           ______________________________  Ellis Parents, MD     SA/MEDQ  D:  04/14/2005  T:  04/14/2005  Job:  [id number]

## 2010-09-19 NOTE — Procedures (Signed)
NAMELADASHIA, Amber Riley NO.:  [id number]   MEDICAL RECORD NO.:  [id number]          PATIENT TYPE:  OUT   LOCATION:  SLEEP CENTER                 FACILITY:  [facility]   PHYSICIAN:  Clinton D. Maple Hudson, M.D. DATE OF BIRTH:  10-Aug-1960   DATE OF STUDY:  11/09/2005                              NOCTURNAL POLYSOMNOGRAM   REFERRING PHYSICIAN:  Dr. Donald Pore.   DATE OF STUDY:  November 09, 2005.   INDICATION FOR STUDY:  Hypersomnia with sleep apnea.   EPWORTH SLEEPINESS SCORE:  20/24.   BMI:  26.5.   WEIGHT:  165 pounds.   HOME MEDICATIONS:  No medications listed.   A diagnostic NPSG protocol requested.   SLEEP ARCHITECTURE:  Total sleep time 335 minutes with sleep efficiency 96%.  Stage I was 16%, stage II 56%, stages III and IV 6%, REM 21% of total sleep  time.  Sleep latency 46 minutes, REM latency 86 minutes, awake after sleep  onset 63 minutes, arousal index 16.6.  No bedtime medication was taken.  She  was described as very restless and nervous at the beginning of the study.   RESPIRATORY DATA:  Apnea/hypopnea index (AHI, RDI) 3.9 obstructive events  per hour which is within normal limits (normal range 0/5 per hour).  There  were 3 obstructive apneas and 19 hypopneas.  Virtually all sleep was while  supine.  REM AHI 7.5.   OXYGEN DATA:  Mild to moderate snoring with oxygen desaturation to a nadir  of 87%.  Mean oxygen saturation through the study was 97% on room air.   CARDIAC DATA:  Normal sinus rhythm.   MOVEMENT/PARASOMNIA:  A total of 68 limb jerks were recorded of which 12  were associated with arousal or awakening for a periodic limb movement with  arousal index of 2.1 per hour which is mildly increased but of uncertain  significance.   IMPRESSION/RECOMMENDATIONS:  1.  Unremarkable sleep architecture with normal REM percentage.  2.  Occasional sleep disordered breathing events, AHI 3.9 per hour which is      within normal limits.  Most sleep and  therefore most events were      recorded while supine.  Mild to moderate snoring with oxygen      desaturation transiently to a nadir of 87% with normal mean oxygen      saturation through the study.  3.  Mild periodic limb movement with arousal, 2.1 per hour.  4.  See multiple sleep latency test report.      Clinton D. Maple Hudson, M.D.  Diplomate, Biomedical engineer of Sleep Medicine  Electronically Signed     CDY/MEDQ  D:  11/22/2005 11:38:31  T:  11/23/2005 00:56:46  Job:  [id number]

## 2010-09-19 NOTE — Op Note (Signed)
[name], Amber Riley                       ACCOUNT NO.:  [id number]   MEDICAL RECORD NO.:  [id number]                   PATIENT TYPE:  INP   LOCATION:  9399                                 FACILITY:  [facility]   PHYSICIAN:  Tracie Harrier, M.D.              DATE OF BIRTH:  1960-10-11   DATE OF PROCEDURE:  01/03/2002  DATE OF DISCHARGE:                                 OPERATIVE REPORT   PREOPERATIVE DIAGNOSIS:  Uterine fibroids, symptomatic.   POSTOPERATIVE DIAGNOSIS:  Uterine fibroids, symptomatic.   PROCEDURE:  Myomectomy.   SURGEON:  Tracie Harrier, M.D.   ASSISTANT:  Duke Salvia. Marcelle Overlie, M.D.   ANESTHESIA:  General.   ESTIMATED BLOOD LOSS:  150 cc.   COMPLICATIONS:  None.   FINDINGS AT TIME OF MYOMECTOMY:  Six significant uterine fibroids were  encountered, the largest being about 7 cm in size.  A multiple myomectomy  was carried out without difficulty.   There was a benign, clear ovarian cyst on the left ovary which was drained  with the Bovie cautery.  The right ovary and adnexa were normal.  The left  tube also normal.   PROCEDURE:  The patient was taken to the operating room where general  endotracheal anesthetic was administered.  The patient was placed on the  operating table in the supine position, the abdomen was prepped and draped  in the usual sterile fashion with Betadine and sterile drapes.  A Foley  catheter was sterilely inserted.  The abdomen was entered through a  Pfannenstiel incision and carried down sharply in the usual fashion.  The  peritoneum was atraumatically entered.  A self-retaining retractor was in  place and the bowel packed away with laparotomy packs.  The uterus was  elevated into the incision.  The Bovie cautery was used to dissect and cut  the serosal surface of the uterus.  There were two incisions made.  A fundal  incision about 7 cm in size was made through which four significant uterine  fibroids were removed.  The incision was  then closed with deep sutures of 0  Vicryl figure-of-eight sutures.  The serosal surface was then closed also  with multiple interrupted sutures of 0 Vicryl.  Also anteriorly and  inferiorly an approximate 2-cm incision was made through which two  significant uterine fibroids were removed.  This was closed in a likewise  fashion with 0 Vicryl.  The left ovarian cyst, which was about 4 cm in size  and clear, was Bovie cauterized and drained.  The pelvis was irrigated and  all operative areas were noted to be hemostatic.  Interceed was then placed  over the suture lines to prevent adhesions.   Attention was then turned to closure.  All abdominal instruments were  removed and abdominal packs as well.  The rectus muscle and anterior  peritoneum was then closed with a running suture of 1 Vicryl.  The  subfascial layers were hemostatic.  The fascia was then closed with two  sutures of 1 Vicryl in a running fashion.  The subcutaneous tissue was  irrigated and made hemostatic using the Bovie cautery.  The skin  reapproximated with staples and a sterile dressing applied.   Final sponge, instrument, and needle counts correct x3.  There were no  perioperative complications.  The patient did receive a preoperative  antibiotic.                                                 Tracie Harrier, M.D.    REG/MEDQ  D:  01/03/2002  T:  01/03/2002  Job:  [id number]

## 2010-09-19 NOTE — H&P (Signed)
   NAME:  Amber Riley, Amber Riley NO.:  [id number]   MEDICAL RECORD NO.:  [id number]                   PATIENT TYPE:  INP   LOCATION:  9399                                 FACILITY:  [facility]   PHYSICIAN:  Tracie Harrier, M.D.              DATE OF BIRTH:  [birth year]   DATE OF ADMISSION:  01/03/2002  DATE OF DISCHARGE:                                HISTORY & PHYSICAL   HISTORY OF PRESENT ILLNESS:  The patient is a 50 year old female gravida 2  para 2 status admitted for multiple myomectomy due to multiple symptomatic  uterine fibroids.  She has had some pelvic pain with her uterine fibroids  and also some mild abnormal bleeding.  She is on oral contraception.   The patient was thoroughly counseled regarding the risks, benefits, and  various treatment options for symptomatic uterine fibroids and has requested  myomectomy.  We discussed this at length.   PAST MEDICAL HISTORY:  1. History of TMJ.  2. History of migraine headaches.  3. History of symptomatic uterine fibroids.   SURGICAL HISTORY:  1. Normal spontaneous vaginal delivery x2 at term.  2. Eye surgery.  3. Bilateral foot surgery.   OBSTETRICAL HISTORY:  Normal spontaneous vaginal delivery x2 at term.   CURRENT MEDICATIONS:  Effexor, Zomig, oral contraception.   ALLERGIES:  PREDNISONE.   PHYSICAL EXAMINATION:  VITAL SIGNS:  Stable, temperature 97, pulse 80,  respirations 20, blood pressure 120/86.  GENERAL:  She is a well-developed, well-nourished female in no acute  distress.  HEENT:  Within normal limits.  NECK:  Supple without adenopathy or thyromegaly.  HEART:  Regular rate and rhythm without murmur, gallop, or rub.  LUNGS:  Clear to auscultation.  BREAST:  Exam done recently in the office was normal but is deferred upon  admission.  ABDOMEN:  Soft and benign without masses, tenderness, organomegaly, or  hernia.  PELVIC:  Normal external female genitalia, vagina and cervix clear.  The  uterus is enlarged to about 10 weeks in size.  The adnexa is clear of mass.  RECTAL:  Deferred.   ADMISSION DIAGNOSIS:  Symptomatic uterine fibroids.   PLAN:  Myomectomy.   DISCHARGE ACTIVITIES:  The risks and benefits of this procedure discussed  with the patient.  Different treatment options also reviewed.  The risk of  bleeding, infection, risk of injury to surrounding to organs was reviewed.  The patient was allowed to ask questions and wished to proceed with  myomectomy.                                               Tracie Harrier, M.D.    REG/MEDQ  D:  01/03/2002  T:  01/03/2002  Job:  [id number]

## 2010-11-26 ENCOUNTER — Other Ambulatory Visit (HOSPITAL_COMMUNITY): Payer: Self-pay | Admitting: Family Medicine

## 2010-11-26 DIAGNOSIS — Z1231 Encounter for screening mammogram for malignant neoplasm of breast: Secondary | ICD-10-CM

## 2010-12-04 ENCOUNTER — Ambulatory Visit (HOSPITAL_COMMUNITY)
Admission: RE | Admit: 2010-12-04 | Discharge: 2010-12-04 | Disposition: A | Discharge status: Home / Self Care | Payer: BC Managed Care – PPO | Source: Ambulatory Visit | Attending: Family Medicine | Admitting: Family Medicine

## 2010-12-04 DIAGNOSIS — Z1231 Encounter for screening mammogram for malignant neoplasm of breast: Secondary | ICD-10-CM

## 2010-12-14 ENCOUNTER — Inpatient Hospital Stay (INDEPENDENT_AMBULATORY_CARE_PROVIDER_SITE_OTHER)
Admission: RE | Admit: 2010-12-14 | Discharge: 2010-12-14 | Disposition: A | Discharge status: Home / Self Care | Payer: Self-pay | Source: Ambulatory Visit | Attending: Family Medicine | Admitting: Family Medicine

## 2010-12-14 DIAGNOSIS — M545 Low back pain, unspecified: Secondary | ICD-10-CM

## 2011-01-22 LAB — POCT URINALYSIS DIP (DEVICE)
Bilirubin Urine: NEGATIVE
Glucose, UA: NEGATIVE
Nitrite: NEGATIVE
Operator id: 148111
Protein, ur: NEGATIVE
Specific Gravity, Urine: >=1.03
Urobilinogen, UA: 0.2
pH: 6

## 2011-11-13 ENCOUNTER — Other Ambulatory Visit (HOSPITAL_COMMUNITY): Payer: Self-pay | Admitting: Family Medicine

## 2011-11-13 DIAGNOSIS — Z1231 Encounter for screening mammogram for malignant neoplasm of breast: Secondary | ICD-10-CM

## 2011-12-08 ENCOUNTER — Ambulatory Visit (HOSPITAL_COMMUNITY)
Admission: RE | Admit: 2011-12-08 | Discharge: 2011-12-08 | Disposition: A | Discharge status: Home / Self Care | Payer: BC Managed Care – PPO | Source: Ambulatory Visit | Attending: Family Medicine | Admitting: Family Medicine

## 2011-12-08 DIAGNOSIS — Z1231 Encounter for screening mammogram for malignant neoplasm of breast: Secondary | ICD-10-CM

## 2011-12-14 ENCOUNTER — Other Ambulatory Visit: Payer: Self-pay | Admitting: Family Medicine

## 2011-12-14 DIAGNOSIS — R928 Other abnormal and inconclusive findings on diagnostic imaging of breast: Secondary | ICD-10-CM

## 2011-12-18 ENCOUNTER — Other Ambulatory Visit: Payer: BC Managed Care – PPO

## 2011-12-21 ENCOUNTER — Ambulatory Visit
Admission: RE | Admit: 2011-12-21 | Discharge: 2011-12-21 | Disposition: A | Discharge status: Home / Self Care | Payer: BC Managed Care – PPO | Source: Ambulatory Visit | Attending: Family Medicine | Admitting: Family Medicine

## 2011-12-21 DIAGNOSIS — R928 Other abnormal and inconclusive findings on diagnostic imaging of breast: Secondary | ICD-10-CM

## 2012-08-29 ENCOUNTER — Emergency Department (HOSPITAL_COMMUNITY)
Admission: EM | Admit: 2012-08-29 | Discharge: 2012-08-29 | Disposition: A | Discharge status: Home / Self Care | Payer: BC Managed Care – PPO | Source: Home / Self Care | Attending: Emergency Medicine | Admitting: Emergency Medicine

## 2012-08-29 ENCOUNTER — Encounter (HOSPITAL_COMMUNITY): Payer: Self-pay | Admitting: Emergency Medicine

## 2012-08-29 DIAGNOSIS — R51 Headache: Secondary | ICD-10-CM

## 2012-08-29 HISTORY — DX: Migraine, unspecified, not intractable, without status migrainosus: G43.909

## 2012-08-29 MED ORDER — KETOROLAC TROMETHAMINE 30 MG/ML IJ SOLN
30.0000 mg | Freq: Once | INTRAMUSCULAR | Status: AC
  Administered 2012-08-29: 30 mg via INTRAMUSCULAR

## 2012-08-29 MED ORDER — KETOROLAC TROMETHAMINE 30 MG/ML IJ SOLN: 30.0000 mg | Freq: Once | INTRAMUSCULAR | Status: DC

## 2012-08-29 MED ORDER — ONDANSETRON 4 MG PO TBDP
4.0000 mg | ORAL_TABLET | Freq: Once | ORAL | Status: AC
  Administered 2012-08-29: 4 mg via ORAL

## 2012-08-29 MED ORDER — ONDANSETRON HCL 4 MG PO TABS: 4.0000 mg | ORAL_TABLET | Freq: Three times a day (TID) | ORAL | Status: DC | PRN

## 2012-08-29 MED ORDER — ONDANSETRON 4 MG PO TBDP
ORAL_TABLET | ORAL | Status: AC
  Filled 2012-08-29: qty 1

## 2012-08-29 MED ORDER — KETOROLAC TROMETHAMINE 10 MG PO TABS: 10.0000 mg | ORAL_TABLET | Freq: Three times a day (TID) | ORAL | Status: DC

## 2012-08-29 MED ORDER — KETOROLAC TROMETHAMINE 30 MG/ML IJ SOLN
INTRAMUSCULAR | Status: AC
  Filled 2012-08-29: qty 1

## 2012-08-29 MED ORDER — ONDANSETRON HCL 4 MG/2ML IJ SOLN
INTRAMUSCULAR | Status: AC
  Filled 2012-08-29: qty 2

## 2012-08-29 NOTE — ED Provider Notes (Signed)
History     CSN: [id number]  Arrival date & time 08/29/12  1002   First MD Initiated Contact with Patient 08/29/12 1017      Chief Complaint  Patient presents with  . Headache    (Consider location/radiation/quality/duration/timing/severity/associated sxs/prior treatment) HPI Comments: Patient presents this morning, complaining of an ongoing right-sided headache that started Saturday. She felt that during the week her right neck and shoulder for which he had a " muscle spasm", but since then has improved leaving her with a lingering headache. Describes as dull that extends from her right forehead, right facial, including ear. " It feels like my face is congested ( points to right maxillary area), patient denies any fevers, rhinorrhea, cough. Patient denies any further neurological symptoms such as diplopia, paresthesias or weakness of upper and lower extremities. Patient also denies any dizziness.  Patient describes that she has been evaluated by [facility] in the past she was diagnosed with some type of migraine headaches. She has had studies in a one point was prescribed prophylactic preventative migraine treatment. It's been several months since she had her last headache. No vomiting, no abdominal pain. Patient denies any constitutional symptoms such as fevers, generalized malaise, changes in appetite or weight loss.    Patient is a 52 y.o. female presenting with headaches. The history is provided by the patient.  Headache Pain location:  R parietal, R temporal and frontal Quality:  Dull Radiates to:  R neck and R shoulder Severity currently:  6/10 Severity at highest:  8/10 Chronicity:  Recurrent Similar to prior headaches: yes   Context: bright light, loud noise and straining   Context: not eating, not stress and not exposure to cold air   Relieved by:  Nothing Worsened by:  Activity, light and sound Associated symptoms: congestion, facial pain, nausea, neck  pain, paresthesias and sinus pressure   Associated symptoms: no back pain, no blurred vision, no cough, no dizziness, no drainage, no ear pain, no fatigue, no fever, no focal weakness, no hearing loss, no loss of balance, no near-syncope, no neck stiffness, no numbness, no photophobia, no sore throat, no swollen glands, no syncope, no tingling, no vomiting and no weakness   Risk factors: no anger, no family hx of SAH and lifestyle not sedentary     Past Medical History  Diagnosis Date  . Migraine   . Back pain   . Goiter     Past Surgical History  Procedure Laterality Date  . Abdominal hysterectomy      History reviewed. No pertinent family history.  History  Substance Use Topics  . Smoking status: Never Smoker   . Smokeless tobacco: Not on file  . Alcohol Use: Yes    OB History   Grav Para Term Preterm Abortions TAB SAB Ect Mult Living                  Review of Systems  Constitutional: Positive for activity change. Negative for fever, chills, appetite change and fatigue.  HENT: Positive for congestion, neck pain and sinus pressure. Negative for hearing loss, ear pain, sore throat, rhinorrhea, sneezing, neck stiffness, postnasal drip and ear discharge.   Eyes: Negative for blurred vision, photophobia and visual disturbance.  Respiratory: Negative for cough and chest tightness.   Cardiovascular: Negative for chest pain, syncope and near-syncope.  Gastrointestinal: Positive for nausea. Negative for vomiting.  Musculoskeletal: Negative for back pain.  Neurological: Positive for headaches and paresthesias. Negative for dizziness, tremors, focal  weakness, facial asymmetry, weakness, numbness and loss of balance.  Hematological: Negative for adenopathy.  Psychiatric/Behavioral: Negative for sleep disturbance and self-injury. The patient is not nervous/anxious.     Allergies  Review of patient's allergies indicates no known allergies.  Home Medications   Current Outpatient  Rx  Name  Route  Sig  Dispense  Refill  . estradiol (ESTRACE) 1 MG tablet   Oral   Take 1 mg by mouth daily.         Marland Kitchen METHOCARBAMOL PO   Oral   Take by mouth.         Marland Kitchen OVER THE COUNTER MEDICATION      Equate headache medicine         . ketorolac (TORADOL) 10 MG tablet   Oral   Take 1 tablet (10 mg total) by mouth every 8 (eight) hours.   12 tablet   0   . ketorolac (TORADOL) 30 MG/ML injection   Intravenous   Inject 1 mL (30 mg total) into the vein once.   1 mL   0   . ondansetron (ZOFRAN) 4 MG tablet   Oral   Take 1 tablet (4 mg total) by mouth every 8 (eight) hours as needed for nausea.   15 tablet   0     BP 114/75  Pulse 73  Temp(Src) 97.8 F (36.6 C) (Oral)  Resp 16  SpO2 98%  Physical Exam  Nursing note and vitals reviewed. Constitutional: She is oriented to person, place, and time. Vital signs are normal. She appears distressed.  HENT:  Head: Normocephalic.  Eyes: Conjunctivae and EOM are normal. Pupils are equal, round, and reactive to light.  Neck: Trachea normal and normal range of motion. Neck supple. Muscular tenderness present. No tracheal tenderness and no spinous process tenderness present. No rigidity. No erythema present. No mass and no thyromegaly present.    Cardiovascular: Normal rate.  Exam reveals no gallop and no friction rub.   No murmur heard. Lymphadenopathy:    She has no cervical adenopathy.  Neurological: She is alert and oriented to person, place, and time. She has normal strength. No cranial nerve deficit or sensory deficit. She exhibits normal muscle tone. She displays a negative Romberg sign. Coordination normal. GCS eye subscore is 4. GCS verbal subscore is 5. GCS motor subscore is 6.  Patient ambulating without difficulty   Skin: No rash noted. No erythema.  Psychiatric: She has a normal mood and affect. Her speech is normal and behavior is normal. Thought content normal.    ED Course  Procedures (including  critical care time)  Labs Reviewed - No data to display No results found.   1. Headache       MDM  Patient presents this morning with a complaint of a unilateral headache. History of complex migraines- been evaluated by a local neurologist at the [facility]. Patient exhibited no changes in his historical patterns of previous headaches. No further neurological symptoms and a unremarkable neurological exam. At [facility] Toradol injection provided with Zofran, with prescriptions for both portal and sulfur. We discussed with patient specifically what symptoms should warrant further evaluation and a emergency department. Have also encouraged patient to call her family doctor if pain persists beyond 48 hours.         Jimmie Molly, MD 08/29/12 1321

## 2012-08-29 NOTE — ED Notes (Signed)
Reports headache since 4/27.  , pain particularly right side of head, shoulder, arm and jaw-all right side

## 2012-08-29 NOTE — ED Notes (Signed)
Patient asking when next dose of medicine can be taken--asked dr Ladon Applebaum.  Next dose in 8-10 hours per dr Ladon Applebaum, informed patient.

## 2012-08-29 NOTE — ED Notes (Signed)
Patient needing work note

## 2012-10-25 ENCOUNTER — Telehealth: Payer: Self-pay | Admitting: *Deleted

## 2012-10-25 NOTE — Telephone Encounter (Signed)
I called the patient and let her know we need for her bring the summons up here and that there is a fee of 20$ that has to be paid before we write letters. The patient states when she have the money she will bring it in.

## 2012-10-25 NOTE — Telephone Encounter (Signed)
Message copied by Salome Spotted on Tue Oct 25, 2012  3:01 PM ------      Message from: Anice Paganini      Created: Tue Oct 25, 2012  2:52 PM      Contact: Pt Avrie       Pt called is needing Dr. Vickey Huger or her nurse to return her call concerning witting her a letter excusing her from jury duty. Date of Jury is 11/14/12 Thanks  ------

## 2012-12-12 ENCOUNTER — Other Ambulatory Visit (HOSPITAL_COMMUNITY): Payer: Self-pay | Admitting: Family Medicine

## 2012-12-12 DIAGNOSIS — Z1231 Encounter for screening mammogram for malignant neoplasm of breast: Secondary | ICD-10-CM

## 2012-12-13 ENCOUNTER — Ambulatory Visit (HOSPITAL_COMMUNITY)
Admission: RE | Admit: 2012-12-13 | Discharge: 2012-12-13 | Disposition: A | Discharge status: Home / Self Care | Payer: BC Managed Care – PPO | Source: Ambulatory Visit | Attending: Family Medicine | Admitting: Family Medicine

## 2012-12-13 DIAGNOSIS — Z1231 Encounter for screening mammogram for malignant neoplasm of breast: Secondary | ICD-10-CM | POA: Insufficient documentation

## 2013-07-05 ENCOUNTER — Other Ambulatory Visit (HOSPITAL_COMMUNITY): Payer: Self-pay | Admitting: Chiropractic Medicine

## 2013-07-05 DIAGNOSIS — M75100 Unspecified rotator cuff tear or rupture of unspecified shoulder, not specified as traumatic: Secondary | ICD-10-CM

## 2013-07-05 DIAGNOSIS — S43439A Superior glenoid labrum lesion of unspecified shoulder, initial encounter: Secondary | ICD-10-CM

## 2013-07-13 ENCOUNTER — Ambulatory Visit (HOSPITAL_COMMUNITY)
Admission: RE | Admit: 2013-07-13 | Discharge: 2013-07-13 | Disposition: A | Discharge status: Home / Self Care | Payer: No Typology Code available for payment source | Source: Ambulatory Visit | Attending: Chiropractic Medicine | Admitting: Chiropractic Medicine

## 2013-07-13 DIAGNOSIS — M67919 Unspecified disorder of synovium and tendon, unspecified shoulder: Secondary | ICD-10-CM | POA: Diagnosis not present

## 2013-07-13 DIAGNOSIS — M19019 Primary osteoarthritis, unspecified shoulder: Secondary | ICD-10-CM | POA: Insufficient documentation

## 2013-07-13 DIAGNOSIS — M75100 Unspecified rotator cuff tear or rupture of unspecified shoulder, not specified as traumatic: Secondary | ICD-10-CM

## 2013-07-13 DIAGNOSIS — M79609 Pain in unspecified limb: Secondary | ICD-10-CM | POA: Diagnosis not present

## 2013-07-13 DIAGNOSIS — M25519 Pain in unspecified shoulder: Secondary | ICD-10-CM | POA: Diagnosis present

## 2013-07-13 DIAGNOSIS — S43439A Superior glenoid labrum lesion of unspecified shoulder, initial encounter: Secondary | ICD-10-CM

## 2013-07-13 DIAGNOSIS — M719 Bursopathy, unspecified: Secondary | ICD-10-CM | POA: Insufficient documentation

## 2013-08-01 ENCOUNTER — Ambulatory Visit: Payer: Self-pay | Admitting: Podiatry

## 2013-08-09 ENCOUNTER — Ambulatory Visit (INDEPENDENT_AMBULATORY_CARE_PROVIDER_SITE_OTHER): Payer: BC Managed Care – PPO | Admitting: Sports Medicine

## 2013-08-09 ENCOUNTER — Ambulatory Visit (INDEPENDENT_AMBULATORY_CARE_PROVIDER_SITE_OTHER): Payer: Self-pay

## 2013-08-09 ENCOUNTER — Encounter: Payer: Self-pay | Admitting: Sports Medicine

## 2013-08-09 VITALS — BP 91/57 | HR 72 | Ht 65.0 in | Wt 149.0 lb

## 2013-08-09 DIAGNOSIS — M4120 Other idiopathic scoliosis, site unspecified: Secondary | ICD-10-CM

## 2013-08-09 DIAGNOSIS — M25519 Pain in unspecified shoulder: Secondary | ICD-10-CM

## 2013-08-09 DIAGNOSIS — M5416 Radiculopathy, lumbar region: Secondary | ICD-10-CM

## 2013-08-09 DIAGNOSIS — M5412 Radiculopathy, cervical region: Secondary | ICD-10-CM | POA: Insufficient documentation

## 2013-08-09 DIAGNOSIS — M5030 Other cervical disc degeneration, unspecified cervical region: Secondary | ICD-10-CM

## 2013-08-09 DIAGNOSIS — G8929 Other chronic pain: Secondary | ICD-10-CM | POA: Insufficient documentation

## 2013-08-09 DIAGNOSIS — IMO0002 Reserved for concepts with insufficient information to code with codable children: Secondary | ICD-10-CM

## 2013-08-09 DIAGNOSIS — M5137 Other intervertebral disc degeneration, lumbosacral region: Secondary | ICD-10-CM

## 2013-08-09 DIAGNOSIS — M25511 Pain in right shoulder: Secondary | ICD-10-CM

## 2013-08-09 HISTORY — DX: Radiculopathy, cervical region: M54.12

## 2013-08-09 HISTORY — DX: Radiculopathy, lumbar region: M54.16

## 2013-08-09 HISTORY — DX: Other chronic pain: G89.29

## 2013-08-09 MED ORDER — MELOXICAM 15 MG PO TABS: ORAL_TABLET | ORAL | Status: DC

## 2013-08-09 MED ORDER — PREDNISONE 50 MG PO TABS: ORAL_TABLET | ORAL | Status: DC

## 2013-08-09 NOTE — Assessment & Plan Note (Signed)
Formal PT. MRI of the shoulder by an outside physician did show subacromial bursitis, acromioclavicular traumatic arthritis. If no better, we will inject her subacromial bursa and a.c. joint.

## 2013-08-09 NOTE — Assessment & Plan Note (Signed)
Prednisone, Mobic, formal physical therapy, x-rays. Return in one month, MRI if no better. 
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
## 2013-08-09 NOTE — Assessment & Plan Note (Signed)
Prednisone, Mobic, formal physical therapy, x-rays. Return in one month, MRI if no better.

## 2013-08-15 ENCOUNTER — Ambulatory Visit (INDEPENDENT_AMBULATORY_CARE_PROVIDER_SITE_OTHER): Payer: BC Managed Care – PPO | Admitting: Podiatry

## 2013-08-15 ENCOUNTER — Ambulatory Visit (INDEPENDENT_AMBULATORY_CARE_PROVIDER_SITE_OTHER): Payer: BC Managed Care – PPO

## 2013-08-15 ENCOUNTER — Telehealth: Payer: Self-pay

## 2013-08-15 VITALS — BP 134/80 | HR 70 | Resp 16

## 2013-08-15 DIAGNOSIS — L84 Corns and callosities: Secondary | ICD-10-CM

## 2013-08-15 DIAGNOSIS — M2180 Other specified acquired deformities of unspecified limb: Secondary | ICD-10-CM

## 2013-08-15 DIAGNOSIS — M21619 Bunion of unspecified foot: Secondary | ICD-10-CM

## 2013-08-15 DIAGNOSIS — M2010 Hallux valgus (acquired), unspecified foot: Secondary | ICD-10-CM

## 2013-08-15 NOTE — Telephone Encounter (Signed)
Patient called requested an order for MRI. I advised patient that Xray, PT and other measures had to be done in order to get a MRI and get approval patient stated that she has appt for PT starting 08/21/13 also advised patient that follow up appt is needed in 4 weeks to see if she is better. Amber Riley,CMA

## 2013-08-15 NOTE — Telephone Encounter (Addendum)
Have her give pt a solid 4 weeks, she does have degenerative changes on xray but finding a herniated disc on mri wont change the current treatment yet.  If no better after 4 weeks pt then we inject her shoulder and mri her neck

## 2013-08-16 NOTE — Progress Notes (Signed)
She presents today with a chief complaint of painful hallux bilateral. She's complaining of how her hallux is painful on range of motion and she's also complaining of painful bunions she's also complaining of painful soft tissue lesions to the dorsal aspect of her toes. She states it is hard for her to perform her daily activities and wear her shoes.  Objective: Vital signs are stable she's alert and oriented x3. I have reviewed her past medical history medications allergies surgeries social history. Pulses are palpable bilateral. She has hammertoe deformities they were previously surgically corrected with arthroplasties which is resulted in an abnormal balance to the toes. However they are being crowded by the hallux interphalangeal is present bilaterally. She also has mild hallux abductovalgus deformities with a history of previous bunion surgery. Radiographic evaluation does demonstrate after mentioned hammertoe deformities but she does have a moderate hallux interphalangeal she also has a moderate hallux abductovalgus deformity but I think this can be corrected with a McBride bilaterally. Cutaneous evaluation demonstrates areas of hyperpigmentation in reactive hyperkeratosis to the dorsal aspect of the toes she's requested that we surgically removed the corns I expressed to her that they would just simply return due to the fact that her toes are contracted. She's requesting that we remove them anyway and I further explained to her that it may increase the hyperpigmentation even more since she is of color.  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 Mild reactive hyperkeratosis overlying flexible hammertoe deformities bilateral.  Plan: Discussed etiology pathology conservative versus surgical therapies. At this point we have considered surgical intervention and went over consent form with her today line bylined number by number giving her ample time to ask  questions she saw fit regarding a McBride bunion repair bilateral an Akin osteotomy hallux bilateral and repair of the soft tissue to the dorsal aspect of her toes with excision of soft tissue lesions. We will the consent form thoroughly and she understands a possible postop complications which may consist of but are not limited to postop pain bleeding swelling infection need for further surgery reactive hyperpigmentation and chronic pain. She signed all 3 pages of the consent form and I will followup with her in the near future.

## 2013-08-17 NOTE — Telephone Encounter (Signed)
Happy to evaluate and treat it when i get back.  If she cant wait she can see one if my partners in the meantime

## 2013-08-17 NOTE — Telephone Encounter (Signed)
Spoke to patient she stated that she has an appt to start PT on 08/21/13 she is leaving to go out of town on 08/24/13 so will not get that much PT in before her appt on 09/06/13.  I adviased the patient that 4 solid weeks of PT was necessary. Please advise she is now having hip pain. Catricia Scheerer,CMA

## 2013-08-18 NOTE — Telephone Encounter (Signed)
Spoke to patient gave her instructions  as noted below. She is still waiting to get in with PT. Taija Mathias,CMA

## 2013-08-21 ENCOUNTER — Ambulatory Visit: Payer: BC Managed Care – PPO | Attending: Sports Medicine

## 2013-08-21 DIAGNOSIS — M545 Low back pain, unspecified: Secondary | ICD-10-CM | POA: Insufficient documentation

## 2013-08-21 DIAGNOSIS — M542 Cervicalgia: Secondary | ICD-10-CM | POA: Insufficient documentation

## 2013-08-21 DIAGNOSIS — M6281 Muscle weakness (generalized): Secondary | ICD-10-CM | POA: Insufficient documentation

## 2013-08-21 DIAGNOSIS — M25519 Pain in unspecified shoulder: Secondary | ICD-10-CM | POA: Insufficient documentation

## 2013-08-21 DIAGNOSIS — IMO0001 Reserved for inherently not codable concepts without codable children: Secondary | ICD-10-CM | POA: Insufficient documentation

## 2013-09-05 ENCOUNTER — Ambulatory Visit: Payer: BC Managed Care – PPO | Attending: Sports Medicine | Admitting: Rehabilitation

## 2013-09-05 DIAGNOSIS — M545 Low back pain, unspecified: Secondary | ICD-10-CM | POA: Insufficient documentation

## 2013-09-05 DIAGNOSIS — IMO0001 Reserved for inherently not codable concepts without codable children: Secondary | ICD-10-CM | POA: Insufficient documentation

## 2013-09-05 DIAGNOSIS — M25519 Pain in unspecified shoulder: Secondary | ICD-10-CM | POA: Insufficient documentation

## 2013-09-05 DIAGNOSIS — M542 Cervicalgia: Secondary | ICD-10-CM | POA: Insufficient documentation

## 2013-09-05 DIAGNOSIS — M6281 Muscle weakness (generalized): Secondary | ICD-10-CM | POA: Insufficient documentation

## 2013-09-06 ENCOUNTER — Ambulatory Visit: Payer: BC Managed Care – PPO | Admitting: Sports Medicine

## 2013-09-07 ENCOUNTER — Telehealth: Payer: Self-pay | Admitting: *Deleted

## 2013-09-07 NOTE — Telephone Encounter (Signed)
Patient returned my call.  She asked to reschedule surgery for the first of June.  I offered her the date of 10/13/2013.  She stated that was good.  I told her I would get it changed with the surgical center.  I informed Dr. Milinda Pointer.

## 2013-09-07 NOTE — Telephone Encounter (Signed)
Tentatively scheduled for surgery on 09/15/2013.  I'm going to need to move that date back.  Please give me a call.  I returned her call.  I left her a message to call me back.  I will go ahead and cancel surgery for 09/15/2013.  Please call to reschedule.

## 2013-09-12 ENCOUNTER — Ambulatory Visit: Payer: BC Managed Care – PPO

## 2013-09-18 ENCOUNTER — Encounter: Payer: BC Managed Care – PPO | Admitting: Rehabilitation

## 2013-09-20 ENCOUNTER — Ambulatory Visit: Payer: BC Managed Care – PPO | Admitting: Physical Therapy

## 2013-09-21 ENCOUNTER — Encounter: Payer: BC Managed Care – PPO | Admitting: Podiatry

## 2013-09-26 ENCOUNTER — Ambulatory Visit: Payer: BC Managed Care – PPO

## 2013-09-26 DIAGNOSIS — M21619 Bunion of unspecified foot: Secondary | ICD-10-CM

## 2013-09-26 DIAGNOSIS — M2180 Other specified acquired deformities of unspecified limb: Secondary | ICD-10-CM

## 2013-09-26 DIAGNOSIS — L84 Corns and callosities: Secondary | ICD-10-CM

## 2013-09-28 ENCOUNTER — Ambulatory Visit: Payer: BC Managed Care – PPO | Admitting: Rehabilitation

## 2013-09-29 ENCOUNTER — Ambulatory Visit (INDEPENDENT_AMBULATORY_CARE_PROVIDER_SITE_OTHER): Payer: BC Managed Care – PPO | Admitting: Sports Medicine

## 2013-09-29 ENCOUNTER — Encounter: Payer: Self-pay | Admitting: Sports Medicine

## 2013-09-29 VITALS — BP 106/67 | HR 69 | Ht 61.0 in | Wt 151.0 lb

## 2013-09-29 DIAGNOSIS — M25511 Pain in right shoulder: Secondary | ICD-10-CM

## 2013-09-29 DIAGNOSIS — M5412 Radiculopathy, cervical region: Secondary | ICD-10-CM

## 2013-09-29 DIAGNOSIS — M5416 Radiculopathy, lumbar region: Secondary | ICD-10-CM

## 2013-09-29 DIAGNOSIS — IMO0002 Reserved for concepts with insufficient information to code with codable children: Secondary | ICD-10-CM

## 2013-09-29 DIAGNOSIS — M25519 Pain in unspecified shoulder: Secondary | ICD-10-CM

## 2013-09-29 NOTE — Assessment & Plan Note (Signed)
Improved but continues to have symptoms despite PT, steroids, NSAIDs. MRI for interventional injection planning. Return to go over her results.

## 2013-09-29 NOTE — Assessment & Plan Note (Signed)
Resolved with PT, steroids, NSAIDs. 

## 2013-09-29 NOTE — Progress Notes (Signed)
  Subjective:    CC: Followup  HPI: Cervical radiculitis: Resolved with physical therapy, steroids, NSAIDs.  Right shoulder pain: Resolved with physical therapy, steroids, NSAIDs.  Lumbar radiculitis: Still persistent.  Past medical history, Surgical history, Family history not pertinant except as noted below, Social history, Allergies, and medications have been entered into the medical record, reviewed, and no changes needed.   Review of Systems: No fevers, chills, night sweats, weight loss, chest pain, or shortness of breath.   Objective:    General: Well Developed, well nourished, and in no acute distress.  Neuro: Alert and oriented x3, extra-ocular muscles intact, sensation grossly intact.  HEENT: Normocephalic, atraumatic, pupils equal round reactive to light, neck supple, no masses, no lymphadenopathy, thyroid nonpalpable.  Skin: Warm and dry, no rashes. Cardiac: Regular rate and rhythm, no murmurs rubs or gallops, no lower extremity edema.  Respiratory: Clear to auscultation bilaterally. Not using accessory muscles, speaking in full sentences. Neck: Inspection unremarkable. No palpable stepoffs. Negative Spurling's maneuver. Full neck range of motion Grip strength and sensation normal in bilateral hands Strength good C4 to T1 distribution No sensory change to C4 to T1 Negative Hoffman sign bilaterally Reflexes normal Right Shoulder: Inspection reveals no abnormalities, atrophy or asymmetry. Palpation is normal with no tenderness over AC joint or bicipital groove. ROM is full in all planes. Rotator cuff strength normal throughout. No signs of impingement with negative Neer and Hawkin's tests, empty can sign. Speeds and Yergason's tests normal. No labral pathology noted with negative Obrien's, negative clunk and good stability. Normal scapular function observed. No painful arc and no drop arm sign. No apprehension sign  Impression and Recommendations:

## 2013-09-29 NOTE — Assessment & Plan Note (Signed)
Resolved with PT, steroids, NSAIDs.

## 2013-10-02 ENCOUNTER — Telehealth: Payer: Self-pay | Admitting: *Deleted

## 2013-10-02 ENCOUNTER — Encounter: Payer: BC Managed Care – PPO | Admitting: Rehabilitation

## 2013-10-02 NOTE — Telephone Encounter (Signed)
PA obtained for MRI Lumbar w/o contrast. Auth # [id number]. Exp. 10/31/13.  Oscar La, LPN

## 2013-10-03 ENCOUNTER — Ambulatory Visit (HOSPITAL_BASED_OUTPATIENT_CLINIC_OR_DEPARTMENT_OTHER)
Admission: RE | Admit: 2013-10-03 | Discharge: 2013-10-03 | Disposition: A | Discharge status: Home / Self Care | Payer: BC Managed Care – PPO | Source: Ambulatory Visit | Attending: Sports Medicine | Admitting: Sports Medicine

## 2013-10-03 DIAGNOSIS — IMO0002 Reserved for concepts with insufficient information to code with codable children: Secondary | ICD-10-CM | POA: Insufficient documentation

## 2013-10-03 DIAGNOSIS — M5416 Radiculopathy, lumbar region: Secondary | ICD-10-CM

## 2013-10-03 DIAGNOSIS — M4804 Spinal stenosis, thoracic region: Secondary | ICD-10-CM | POA: Insufficient documentation

## 2013-10-04 ENCOUNTER — Telehealth: Payer: Self-pay | Admitting: *Deleted

## 2013-10-04 ENCOUNTER — Encounter: Payer: BC Managed Care – PPO | Admitting: Rehabilitation

## 2013-10-04 NOTE — Telephone Encounter (Signed)
Due to have scheduled surgery the 12th of this month, June.  Please give me a call.  I returned her call.  She stated she has not heard anything about what time she needs to be at the surgical center.  I informed her that the surgical center normally calls a day or two prior to surgery date.  She asked if she will be wearing boots.  I told her she will be wearing surgical shoes.  She asked if she will be using crutches.  I informed her no.  She asked if she will be using a scooter.  I told her no, that for people that are non-weight bearing.  She asked how she will get around.  I told her she will be able to walk.  She asked if she will have to walk on her heels.  I told her no, she will be walking flat like normal.  She asked what can she do about walking to her job.  I told her we can give her a form to get a handicap sticker.  She stated that won't help, still a little far from her job.  She asked how long will she not be able to drive.  I told her that's something that Dr. Milinda Pointer will have to determine.  She stated okay I think that's all my questions.

## 2013-10-11 ENCOUNTER — Ambulatory Visit (INDEPENDENT_AMBULATORY_CARE_PROVIDER_SITE_OTHER): Payer: BC Managed Care – PPO | Admitting: Sports Medicine

## 2013-10-11 ENCOUNTER — Ambulatory Visit
Admission: RE | Admit: 2013-10-11 | Discharge: 2013-10-11 | Disposition: A | Discharge status: Home / Self Care | Payer: BC Managed Care – PPO | Source: Ambulatory Visit | Attending: Sports Medicine | Admitting: Sports Medicine

## 2013-10-11 VITALS — BP 124/71 | HR 92 | Ht 65.0 in | Wt 148.0 lb

## 2013-10-11 DIAGNOSIS — IMO0002 Reserved for concepts with insufficient information to code with codable children: Secondary | ICD-10-CM

## 2013-10-11 DIAGNOSIS — M5416 Radiculopathy, lumbar region: Secondary | ICD-10-CM

## 2013-10-11 MED ORDER — IOHEXOL 180 MG/ML  SOLN
1.0000 mL | Freq: Once | INTRAMUSCULAR | Status: AC | PRN
  Administered 2013-10-11: 1 mL via EPIDURAL

## 2013-10-11 MED ORDER — METHYLPREDNISOLONE ACETATE 40 MG/ML INJ SUSP (RADIOLOG
120.0000 mg | Freq: Once | INTRAMUSCULAR | Status: AC
  Administered 2013-10-11: 120 mg via EPIDURAL

## 2013-10-11 NOTE — Assessment & Plan Note (Signed)
MRI does confirm L5-S1 degenerative disc disease with a broad-based protrusion causing bilateral, right worse than left foraminal stenosis. Symptoms are predominantly L5, right-sided. We are going to proceed with an L5-S1 intralaminar epidural. Return to see me 2 weeks after the injection.

## 2013-10-11 NOTE — Progress Notes (Signed)
    Subjective:    CC: MRI results  HPI: This pleasant 53 year-old female returns, she has right-sided L5 distribution radiculopathy not approved by physical therapy, steroids, NSAIDs, muscle relaxers. Pain is moderate, persistent. She does have foot surgery coming up at the end of this week.  Past medical history, Surgical history, Family history not pertinant except as noted below, Social history, Allergies, and medications have been entered into the medical record, reviewed, and no changes needed.   Review of Systems: No fevers, chills, night sweats, weight loss, chest pain, or shortness of breath.   Objective:    General: Well Developed, well nourished, and in no acute distress.  Neuro: Alert and oriented x3, extra-ocular muscles intact, sensation grossly intact.  HEENT: Normocephalic, atraumatic, pupils equal round reactive to light, neck supple, no masses, no lymphadenopathy, thyroid nonpalpable.  Skin: Warm and dry, no rashes. Cardiac: Regular rate and rhythm, no murmurs rubs or gallops, no lower extremity edema. Respiratory: Clear to auscultation bilaterally. Not using accessory muscles, speaking in full sentences.  MRI shows multilevel disc protrusion is worse at the L5-S1 level with right greater than left foraminal stenosis.  Impression and Recommendations:

## 2013-10-13 DIAGNOSIS — D492 Neoplasm of unspecified behavior of bone, soft tissue, and skin: Secondary | ICD-10-CM

## 2013-10-17 ENCOUNTER — Telehealth: Payer: Self-pay

## 2013-10-17 NOTE — Telephone Encounter (Signed)
Spoke with pt regarding post op status. She states that she is doing better than this morning. Pt was advised to loosen Ace wrap around foot when painful throbbing occurs. Advised to use ibuprofen in between Percocet doses to help with break through pain, advised to keep elevated and ice. Call with any questions or concerns

## 2013-10-19 ENCOUNTER — Ambulatory Visit (INDEPENDENT_AMBULATORY_CARE_PROVIDER_SITE_OTHER): Payer: BC Managed Care – PPO | Admitting: Podiatry

## 2013-10-19 ENCOUNTER — Encounter: Payer: Self-pay | Admitting: Podiatry

## 2013-10-19 ENCOUNTER — Ambulatory Visit (INDEPENDENT_AMBULATORY_CARE_PROVIDER_SITE_OTHER): Payer: BC Managed Care – PPO

## 2013-10-19 VITALS — BP 159/92 | HR 68 | Temp 97.9°F | Resp 18

## 2013-10-19 DIAGNOSIS — M21619 Bunion of unspecified foot: Secondary | ICD-10-CM

## 2013-10-19 MED ORDER — OXYCODONE-ACETAMINOPHEN 10-325 MG PO TABS
1.0000 | ORAL_TABLET | Freq: Four times a day (QID) | ORAL | Status: DC | PRN
Start: 2013-10-19 — End: 2014-01-11

## 2013-10-19 NOTE — Progress Notes (Signed)
She presents today one week status post Akin osteotomy to the hallux bilateral. Removal soft tissue lesion toes #2 and 3 of the left foot and #2 of the right foot. She denies fever chills nausea vomiting muscle aches and pains other than pain to her left foot which is been more painful throughout the entire first metatarsophalangeal joint.  Objective: She presents today with use of the cane and bilateral Cam Walker. Once seated dry sterile dressing was removed minimal edema no erythema saline is drainage or odor. Radiographic evaluation demonstrates all osteotomies appear to be closed nicely and appear to be healing nicely.  Assessment well-healing surgical foot bilateral.  Plan: Redressed today dressed a compressive dressing and another prescription for pain medication Percocet #60 will followup with her in one week.

## 2013-10-26 ENCOUNTER — Ambulatory Visit (INDEPENDENT_AMBULATORY_CARE_PROVIDER_SITE_OTHER): Payer: BC Managed Care – PPO | Admitting: Podiatry

## 2013-10-26 ENCOUNTER — Encounter: Payer: Self-pay | Admitting: Podiatry

## 2013-10-26 VITALS — BP 113/78 | HR 101 | Temp 98.8°F | Resp 16

## 2013-10-26 DIAGNOSIS — M21619 Bunion of unspecified foot: Secondary | ICD-10-CM

## 2013-10-26 DIAGNOSIS — Z9889 Other specified postprocedural states: Secondary | ICD-10-CM

## 2013-10-26 NOTE — Progress Notes (Signed)
She presents today 2 weeks now status post Akin osteotomy and excision lesions toes ##3 left and to right foot. She denies fever chills nausea vomiting muscle aches and pains other than pain associated with the first metatarsophalangeal joint of the left foot. She states this and is very tender. She relates while talking to her mother that she's been walking around the house without Cam Gilford Rile is on barefooted particularly because they were too heavy she says.  Objective: Vital signs are stable she is alert and oriented x3 sutures were removed today margins remain well coapted. I placed her in a Darco shoes today.  Assessment: Well-healing surgical toes.  Plan: Remove sutures today put her in Darco shoes and compression anklet I will followup with her in 2 weeks for another set of x-rays.

## 2013-11-07 ENCOUNTER — Telehealth: Payer: Self-pay | Admitting: Sports Medicine

## 2013-11-07 NOTE — Telephone Encounter (Signed)
Actually I wanted her to come see me, please have her make an appointment.

## 2013-11-07 NOTE — Telephone Encounter (Signed)
Pt called. Patient states Dr T asked her to give him a call after she has had Epidural.

## 2013-11-08 ENCOUNTER — Other Ambulatory Visit (HOSPITAL_COMMUNITY): Payer: Self-pay | Admitting: Family Medicine

## 2013-11-08 DIAGNOSIS — Z1231 Encounter for screening mammogram for malignant neoplasm of breast: Secondary | ICD-10-CM

## 2013-11-08 NOTE — Telephone Encounter (Signed)
Called patient advised her to schedule appt to follow up after having epidural injection. Rhonda Cunningham,CMA

## 2013-11-09 ENCOUNTER — Ambulatory Visit (INDEPENDENT_AMBULATORY_CARE_PROVIDER_SITE_OTHER): Payer: BC Managed Care – PPO | Admitting: Podiatry

## 2013-11-09 ENCOUNTER — Ambulatory Visit (INDEPENDENT_AMBULATORY_CARE_PROVIDER_SITE_OTHER): Payer: BC Managed Care – PPO

## 2013-11-09 ENCOUNTER — Ambulatory Visit (INDEPENDENT_AMBULATORY_CARE_PROVIDER_SITE_OTHER): Payer: BC Managed Care – PPO | Admitting: Sports Medicine

## 2013-11-09 ENCOUNTER — Encounter: Payer: Self-pay | Admitting: Podiatry

## 2013-11-09 ENCOUNTER — Encounter: Payer: Self-pay | Admitting: Sports Medicine

## 2013-11-09 VITALS — BP 115/74 | HR 99 | Wt 148.0 lb

## 2013-11-09 VITALS — BP 108/72 | HR 80 | Resp 16

## 2013-11-09 DIAGNOSIS — IMO0002 Reserved for concepts with insufficient information to code with codable children: Secondary | ICD-10-CM

## 2013-11-09 DIAGNOSIS — M5416 Radiculopathy, lumbar region: Secondary | ICD-10-CM

## 2013-11-09 DIAGNOSIS — Z9889 Other specified postprocedural states: Secondary | ICD-10-CM

## 2013-11-09 NOTE — Progress Notes (Signed)
Subjective:    Patient ID: Amber Riley, female    DOB: 1961/04/23, 53 y.o.   MRN: [id number]  HPI  Pt is here for f/u surgical consult, Akin #3 left and right, states that her feet are doing fine, but still has limited movement on #3 left foot. Both toes are still numb feeling. DOS 10/13/13   Review of Systems     Objective:   Physical Exam: She presents today one month status post McBride bunion repair bilateral and Akin osteotomies bilateral and is in the company of her mother. She also had soft tissue lesions removed from the dorsal aspect of her second and third toes left foot and second toe right foot. She denies fever chills nausea vomiting muscle aches and pains. She states that the first metatarsophalangeal joint is still tender. She states that she's been trying to move her big toe to her left foot but it is extremely painful. She relates only passive range of motion activities. She has not been massaging the foot or soaking the foot. She states that she's been wearing her Darco shoes a regular basis no on examination the shoes are very clean in the plantar surface is not warm. She states that the shoes are uncomfortable and would like to have another pair to see if they were more comfortable. She feels that the insole of the shoe is becoming compressed. She is currently disappointed in her progress particularly of the first metatarsophalangeal joint of the left foot. This toe is currently stiff and limited on his range of motion secondary to osteoarthritis that was found during surgery. She's also disappointed in the fact that her toes do not lay flat. I expressed to her prior to surgery the excising only soft tissue will #1 not affect the anatomy of the toe and #2 will ultimately result in another soft tissue lesion. The bilateral foot demonstrates dry xerotic skin present along the incision site. Incision site is completely closed and appears to be more scar on the left than on the  right. She has a more full range of motion of the first metatarsophalangeal joint of the right foot with no tenderness to the toe the left foot is limited on his range of motion at the metatarsophalangeal joint and is painful at the Akin osteotomy. Radiographic evaluation demonstrates a fractured lateral cortex with a distraction of the capital fragment of the proximal phalanx. This is possibly secondary to ambulation without the Cam Walker or the Darco shoe. The contralateral foot, the left metatarsophalangeal joint demonstrates soft tissue increase in density around the joint associated with scar tissue however the joint space appears to be open and the Akin osteotomy appears to be healing nicely. There is no edema no cellulitis no drainage no odor and no erythema.        Assessment & Plan:  Assessment: 4 weeks status post be brought bunion repair and Akin osteotomies bilateral. Excision soft tissue lesions toes bilateral.  Plan: Discussed etiology pathology conservative versus surgical therapies. She was dispensed a new pair of shoes. I encouraged range of motion exercises to the first metatarsophalangeal joint left greater than right demonstrating this to her today. Encouraging her also to soak his feet in soapy water to help loosen up the dry skin from surgery. She is to wash this with soap and water and a washcloth. Once the dry skin sloughs off and she's to apply lotion on regular basis with massage type motions over the first metatarsophalangeal joint left.  She is also to increase active and passive range of motion exercises to the first metatarsophalangeal joint of the left foot. She is under no circumstances to walk without her Darco shoes. She states that she will be going back to work next week. I suggested that she stay seated with her feet elevated as much as possible and followup with me if there are any questions or concerns or trauma to her toes. Otherwise I will followup with her in 2  weeks at which time another set of x-rays will be taken and physical therapy will be considered should she still experienced pain about the first metatarsophalangeal joint and limited range of motion. She did not request further pain medication.

## 2013-11-09 NOTE — Assessment & Plan Note (Addendum)
Good response to right L5-S1 lumbar epidural however she did subsequently have bilateral bunionectomy. I do think that prolonged flexion at the hips and extension at the knees, with keeping her legs elevated have probably irritated her L5 nerve roots. For now she will continue Mobic, let the feet heal, and then in 2 months we can discuss a repeat epidural. I would also like to decrease the amount of steroid in her system as her bunionectomy heals.

## 2013-11-09 NOTE — Progress Notes (Addendum)
  Subjective:    CC: Followup  HPI: Lumbar degenerative disc disease: Did well initially after her right L5-S1 epidural with good pain relief, unfortunately she was forced to sit in a straight leg raise type position to elevate her feet after bilateral bunionectomy. This is irritated her back and her radicular symptoms but she understands that we may need to wait for her feet to heal before pursuing further epidurals. Her pain is better now than before her injection.  Past medical history, Surgical history, Family history not pertinant except as noted below, Social history, Allergies, and medications have been entered into the medical record, reviewed, and no changes needed.   Review of Systems: No fevers, chills, night sweats, weight loss, chest pain, or shortness of breath.   Objective:    General: Well Developed, well nourished, and in no acute distress.  Neuro: Alert and oriented x3, extra-ocular muscles intact, sensation grossly intact.  HEENT: Normocephalic, atraumatic, pupils equal round reactive to light, neck supple, no masses, no lymphadenopathy, thyroid nonpalpable.  Skin: Warm and dry, no rashes. Cardiac: Regular rate and rhythm, no murmurs rubs or gallops, no lower extremity edema.  Respiratory: Clear to auscultation bilaterally. Not using accessory muscles, speaking in full sentences.  Impression and Recommendations:

## 2013-11-10 ENCOUNTER — Encounter: Payer: Self-pay | Admitting: Podiatry

## 2013-11-10 NOTE — Progress Notes (Signed)
Dr Milinda Pointer performed a bilateral AIken Osteotomy and Bilateral Keller/Mcbride Bunionectomy on 10/13/13. Prescribed Percocet 10/325mg  #60 1-2 tabs Q6-8 hrs prn pain, phenergan 25mg  #30 1 tab Q6-8 hrs prn nausea, Keflex 500mg  # 30 TID

## 2013-11-28 ENCOUNTER — Encounter: Payer: Self-pay | Admitting: Podiatry

## 2013-11-28 ENCOUNTER — Ambulatory Visit (INDEPENDENT_AMBULATORY_CARE_PROVIDER_SITE_OTHER): Payer: BC Managed Care – PPO | Admitting: Podiatry

## 2013-11-28 ENCOUNTER — Ambulatory Visit (INDEPENDENT_AMBULATORY_CARE_PROVIDER_SITE_OTHER): Payer: BC Managed Care – PPO

## 2013-11-28 VITALS — BP 100/69 | HR 88 | Resp 16

## 2013-11-28 DIAGNOSIS — M2010 Hallux valgus (acquired), unspecified foot: Secondary | ICD-10-CM

## 2013-11-28 NOTE — Progress Notes (Signed)
She presents today for followup of her McBride bunion repair bilaterally in her Akin bunion repair bilaterally as well as soft tissue removal to toes #2 on the right foot #2 and #3 of the left foot. She states that her left foot is doing some better but is still tight and she's concerned that he will be able to move it. She states that she has been trying to wiggle her toe a couple of times a day. States that she is unable to perform his actions at work. But she does demonstrate to me that she's been trying to stretch the toe at home and demonstrates exactly how she was doing that.  Objective: Vital signs are stable she is alert and oriented x3. She has great range of motion with no pain on palpation to the first metatarsophalangeal joint of the right foot. However her left foot does demonstrate edema and erythema with hyperpigmentation consistent with swelling and post inflammatory response. She does have a limitation on range of motion radiographically it appears that she has osteoarthritis of the first metatarsophalangeal joint which was also demonstrated in surgery. Radiographic evaluation also demonstrates well-healing Akin osteotomies.  Assessment: Well-healing surgical foot bilaterally. The left foot appears to be healing little more slowly and is a little more painful with osteoarthritis.  Plan: At this point we are going to consider physical therapy. I encouraged her to go to physical therapy 3 times a week for the next 4 weeks however she's concerned about the co-pays and that she will not be able to afford it. So we also demonstrated further examples of range of motion exercises. We discussed this thoroughly and she will try to release complete to times a week for physical therapy treatment and I will followup with her in 3 weeks.

## 2013-12-05 ENCOUNTER — Telehealth: Payer: Self-pay

## 2013-12-05 DIAGNOSIS — M51369 Other intervertebral disc degeneration, lumbar region without mention of lumbar back pain or lower extremity pain: Secondary | ICD-10-CM

## 2013-12-05 DIAGNOSIS — M5136 Other intervertebral disc degeneration, lumbar region: Secondary | ICD-10-CM

## 2013-12-05 NOTE — Telephone Encounter (Signed)
Patient called stated that she as seen 11/09/13 for back pain she stated that epidural was up for discussion and at the time she could not get it done because she was having surgery on her feet, she has had the surgery and doing better so now she request a order to have epidural done. Rhonda Cunningham,CMA

## 2013-12-05 NOTE — Telephone Encounter (Signed)
Orders placed for repeat right-sided L5-S1 interlaminar epidural, please call [facility].

## 2013-12-06 NOTE — Telephone Encounter (Signed)
Patient has been informed that Amber Riley from GI would be calling to set up appt for epidural injection. Amber Riley,CMA

## 2013-12-13 ENCOUNTER — Ambulatory Visit: Payer: BC Managed Care – PPO

## 2013-12-14 ENCOUNTER — Ambulatory Visit (HOSPITAL_COMMUNITY)
Admission: RE | Admit: 2013-12-14 | Discharge: 2013-12-14 | Disposition: A | Discharge status: Home / Self Care | Payer: BC Managed Care – PPO | Source: Ambulatory Visit | Attending: Family Medicine | Admitting: Family Medicine

## 2013-12-14 DIAGNOSIS — Z1231 Encounter for screening mammogram for malignant neoplasm of breast: Secondary | ICD-10-CM | POA: Diagnosis not present

## 2013-12-18 ENCOUNTER — Ambulatory Visit: Payer: BC Managed Care – PPO | Attending: Family Medicine

## 2013-12-18 DIAGNOSIS — M25673 Stiffness of unspecified ankle, not elsewhere classified: Secondary | ICD-10-CM | POA: Insufficient documentation

## 2013-12-18 DIAGNOSIS — IMO0001 Reserved for inherently not codable concepts without codable children: Secondary | ICD-10-CM | POA: Insufficient documentation

## 2013-12-18 DIAGNOSIS — M25676 Stiffness of unspecified foot, not elsewhere classified: Secondary | ICD-10-CM | POA: Insufficient documentation

## 2013-12-18 DIAGNOSIS — R262 Difficulty in walking, not elsewhere classified: Secondary | ICD-10-CM | POA: Insufficient documentation

## 2013-12-19 ENCOUNTER — Encounter: Payer: Self-pay | Admitting: Podiatry

## 2013-12-19 ENCOUNTER — Ambulatory Visit (INDEPENDENT_AMBULATORY_CARE_PROVIDER_SITE_OTHER): Payer: BC Managed Care – PPO | Admitting: Podiatry

## 2013-12-19 ENCOUNTER — Ambulatory Visit (INDEPENDENT_AMBULATORY_CARE_PROVIDER_SITE_OTHER): Payer: BC Managed Care – PPO

## 2013-12-19 VITALS — BP 104/60 | HR 75 | Resp 16

## 2013-12-19 DIAGNOSIS — M21619 Bunion of unspecified foot: Secondary | ICD-10-CM

## 2013-12-19 DIAGNOSIS — M2010 Hallux valgus (acquired), unspecified foot: Secondary | ICD-10-CM

## 2013-12-19 NOTE — Progress Notes (Signed)
She presents today for followup of her McBride bunion repair an Akin osteotomies hallux bilateral. She states that everything seems to be doing better on the right side left-sided still stiff. She's only been to physical therapy one time over the past few weeks.  Objective: Vital signs are stable she is alert and oriented x3. She has good range of motion of the first metatarsophalangeal joint right greater than left. Much decrease in edema bilaterally much decrease in scar adhesion bilaterally the IP joint of the hallux left appears to be more mobile than that of the right foot. Radiographic irrigation demonstrates well-healing Akin osteotomies. Well-healing fractured Akin osteotomy right foot.  Assessment: Status post McBride and Aikens bilateral. Stiff first metatarsophalangeal joint left.  Plan: Encouraged physical therapy demonstrating this to her for several minutes. We also discussed the importance of formal physical therapy which I suggested that she continue. I will followup with her in approximately one month at which time I expect that she will be doing much better.

## 2013-12-22 ENCOUNTER — Ambulatory Visit
Admission: RE | Admit: 2013-12-22 | Discharge: 2013-12-22 | Disposition: A | Discharge status: Home / Self Care | Payer: BC Managed Care – PPO | Source: Ambulatory Visit | Attending: Sports Medicine | Admitting: Sports Medicine

## 2013-12-22 MED ORDER — IOHEXOL 180 MG/ML  SOLN
1.0000 mL | Freq: Once | INTRAMUSCULAR | Status: AC | PRN
  Administered 2013-12-22: 1 mL via EPIDURAL

## 2013-12-22 MED ORDER — METHYLPREDNISOLONE ACETATE 40 MG/ML INJ SUSP (RADIOLOG
120.0000 mg | Freq: Once | INTRAMUSCULAR | Status: AC
  Administered 2013-12-22: 120 mg via EPIDURAL

## 2013-12-22 NOTE — Discharge Instructions (Signed)

## 2013-12-27 ENCOUNTER — Ambulatory Visit: Payer: BC Managed Care – PPO

## 2013-12-27 DIAGNOSIS — IMO0001 Reserved for inherently not codable concepts without codable children: Secondary | ICD-10-CM | POA: Diagnosis not present

## 2013-12-29 ENCOUNTER — Ambulatory Visit: Payer: BC Managed Care – PPO

## 2013-12-29 DIAGNOSIS — IMO0001 Reserved for inherently not codable concepts without codable children: Secondary | ICD-10-CM | POA: Diagnosis not present

## 2014-01-02 ENCOUNTER — Encounter: Payer: BC Managed Care – PPO | Admitting: Physical Therapy

## 2014-01-04 ENCOUNTER — Ambulatory Visit: Payer: BC Managed Care – PPO | Attending: Family Medicine | Admitting: Physical Therapy

## 2014-01-04 DIAGNOSIS — R262 Difficulty in walking, not elsewhere classified: Secondary | ICD-10-CM | POA: Diagnosis not present

## 2014-01-04 DIAGNOSIS — IMO0001 Reserved for inherently not codable concepts without codable children: Secondary | ICD-10-CM | POA: Diagnosis not present

## 2014-01-04 DIAGNOSIS — M25676 Stiffness of unspecified foot, not elsewhere classified: Secondary | ICD-10-CM | POA: Insufficient documentation

## 2014-01-04 DIAGNOSIS — M25673 Stiffness of unspecified ankle, not elsewhere classified: Secondary | ICD-10-CM | POA: Diagnosis not present

## 2014-01-10 ENCOUNTER — Ambulatory Visit: Payer: BC Managed Care – PPO | Admitting: Sports Medicine

## 2014-01-11 ENCOUNTER — Encounter: Payer: Self-pay | Admitting: Sports Medicine

## 2014-01-11 ENCOUNTER — Ambulatory Visit (INDEPENDENT_AMBULATORY_CARE_PROVIDER_SITE_OTHER): Payer: BC Managed Care – PPO | Admitting: Sports Medicine

## 2014-01-11 VITALS — BP 106/70 | HR 79 | Wt 151.0 lb

## 2014-01-11 DIAGNOSIS — IMO0002 Reserved for concepts with insufficient information to code with codable children: Secondary | ICD-10-CM

## 2014-01-11 DIAGNOSIS — M5416 Radiculopathy, lumbar region: Secondary | ICD-10-CM

## 2014-01-11 MED ORDER — DICLOFENAC SODIUM 2 % TD SOLN: 2.0000 | Freq: Two times a day (BID) | TRANSDERMAL | Status: DC

## 2014-01-11 MED ORDER — DICLOFENAC SODIUM 75 MG PO TBEC: 75.0000 mg | DELAYED_RELEASE_TABLET | Freq: Two times a day (BID) | ORAL | Status: DC

## 2014-01-11 MED ORDER — CYCLOBENZAPRINE HCL 5 MG PO TABS: ORAL_TABLET | ORAL | Status: DC

## 2014-01-11 NOTE — Progress Notes (Signed)
  Subjective:    CC: Followup  HPI: Lumbar degenerative disc disease: This is a pleasant 54 year old female who is now post to right-sided L5-S1 epidurals with good relief after each one. Overall she tells me she is approximately 30-40% improved since day one. She's not taking any oral anti-inflammatories, she only takes an occasional muscle relaxer.  Postop bunion: Overall doing well, managed by podiatry.  Past medical history, Surgical history, Family history not pertinant except as noted below, Social history, Allergies, and medications have been entered into the medical record, reviewed, and no changes needed.   Review of Systems: No fevers, chills, night sweats, weight loss, chest pain, or shortness of breath.   Objective:    General: Well Developed, well nourished, and in no acute distress.  Neuro: Alert and oriented x3, extra-ocular muscles intact, sensation grossly intact.  HEENT: Normocephalic, atraumatic, pupils equal round reactive to light, neck supple, no masses, no lymphadenopathy, thyroid nonpalpable.  Skin: Warm and dry, no rashes. Cardiac: Regular rate and rhythm, no murmurs rubs or gallops, no lower extremity edema.  Respiratory: Clear to auscultation bilaterally. Not using accessory muscles, speaking in full sentences.  Impression and Recommendations:    I spent 25 minutes with this patient, greater than 50% was face-to-face time counseling regarding the above several diagnoses.

## 2014-01-11 NOTE — Assessment & Plan Note (Signed)
30-40% improvement after 2 epidurals. They have been effective on the right at the L5-S1 level. She will call me when she is ready for a third epidural. Adding diclofenac oral, low-dose cyclobenzaprine, and topical diclofenac for her postoperative foot pain. We discussed the natural history of degenerative disc disease, and success rates of various operative interventions, we will discuss referral at a future visit.

## 2014-01-15 ENCOUNTER — Ambulatory Visit: Payer: BC Managed Care – PPO | Admitting: Rehabilitation

## 2014-01-15 DIAGNOSIS — IMO0001 Reserved for inherently not codable concepts without codable children: Secondary | ICD-10-CM | POA: Diagnosis not present

## 2014-01-16 ENCOUNTER — Encounter: Payer: Self-pay | Admitting: Podiatry

## 2014-01-16 ENCOUNTER — Ambulatory Visit (INDEPENDENT_AMBULATORY_CARE_PROVIDER_SITE_OTHER): Payer: BC Managed Care – PPO

## 2014-01-16 ENCOUNTER — Ambulatory Visit (INDEPENDENT_AMBULATORY_CARE_PROVIDER_SITE_OTHER): Payer: BC Managed Care – PPO | Admitting: Podiatry

## 2014-01-16 VITALS — BP 102/74 | HR 80 | Resp 12

## 2014-01-16 DIAGNOSIS — Z9889 Other specified postprocedural states: Secondary | ICD-10-CM

## 2014-01-16 DIAGNOSIS — M2010 Hallux valgus (acquired), unspecified foot: Secondary | ICD-10-CM

## 2014-01-16 NOTE — Progress Notes (Signed)
She presents today for followup of her surgical feet. She states the right foot appears to be doing much better than the left but the left is coming along. She continues physical therapy on a regular basis. There has been some improvement in range of motion of her left foot.  Objective: Vital signs are stable she is alert oriented x3. She has a great range of motion of the first metatarsophalangeal joint of the right foot however she still has some edema and scar tissue overlying the first metatarsophalangeal joint of the left foot. This is limiting the range of motion of the first metatarsophalangeal joint left as is the arthritis of the joint.  Assessment: Well-healing surgical foot right greater than left osteoarthritis and slow healing first metatarsophalangeal joint left foot. Akin osteotomies have gone to heal uneventfully.  Plan: I encouraged her to continue physical therapy and range of motion exercises I would allow her to start exercising again and I will followup with her in the near future for reevaluation.

## 2014-01-19 ENCOUNTER — Ambulatory Visit: Payer: BC Managed Care – PPO | Admitting: Physical Therapy

## 2014-01-23 ENCOUNTER — Ambulatory Visit: Payer: BC Managed Care – PPO | Admitting: Rehabilitation

## 2014-01-23 DIAGNOSIS — IMO0001 Reserved for inherently not codable concepts without codable children: Secondary | ICD-10-CM | POA: Diagnosis not present

## 2014-01-25 ENCOUNTER — Ambulatory Visit: Payer: BC Managed Care – PPO | Admitting: Rehabilitation

## 2014-01-25 DIAGNOSIS — IMO0001 Reserved for inherently not codable concepts without codable children: Secondary | ICD-10-CM | POA: Diagnosis not present

## 2014-01-30 ENCOUNTER — Ambulatory Visit: Payer: BC Managed Care – PPO | Admitting: Physical Therapy

## 2014-01-30 DIAGNOSIS — IMO0001 Reserved for inherently not codable concepts without codable children: Secondary | ICD-10-CM | POA: Diagnosis not present

## 2014-01-31 ENCOUNTER — Ambulatory Visit: Payer: BC Managed Care – PPO | Admitting: Physical Therapy

## 2014-01-31 DIAGNOSIS — IMO0001 Reserved for inherently not codable concepts without codable children: Secondary | ICD-10-CM | POA: Diagnosis not present

## 2014-02-05 ENCOUNTER — Ambulatory Visit: Payer: BC Managed Care – PPO | Attending: Family Medicine | Admitting: Physical Therapy

## 2014-02-05 ENCOUNTER — Encounter: Payer: BC Managed Care – PPO | Admitting: Physical Therapy

## 2014-02-05 DIAGNOSIS — Z5189 Encounter for other specified aftercare: Secondary | ICD-10-CM | POA: Diagnosis not present

## 2014-02-05 DIAGNOSIS — R262 Difficulty in walking, not elsewhere classified: Secondary | ICD-10-CM | POA: Diagnosis not present

## 2014-02-05 DIAGNOSIS — M25673 Stiffness of unspecified ankle, not elsewhere classified: Secondary | ICD-10-CM | POA: Diagnosis not present

## 2014-02-06 ENCOUNTER — Telehealth: Payer: Self-pay

## 2014-02-06 DIAGNOSIS — M5136 Other intervertebral disc degeneration, lumbar region: Secondary | ICD-10-CM

## 2014-02-06 DIAGNOSIS — M51369 Other intervertebral disc degeneration, lumbar region without mention of lumbar back pain or lower extremity pain: Secondary | ICD-10-CM

## 2014-02-06 NOTE — Telephone Encounter (Signed)
Orders placed.

## 2014-02-06 NOTE — Telephone Encounter (Signed)
Patient is requesting another order for Epidural . Amber Riley

## 2014-02-07 NOTE — Telephone Encounter (Signed)
Patient has been informed and a message was left on Amber Riley  from GI vm to call and schedule the patient. [name] [name],CMA

## 2014-02-08 ENCOUNTER — Ambulatory Visit: Payer: BC Managed Care – PPO | Admitting: Physical Therapy

## 2014-02-08 DIAGNOSIS — Z5189 Encounter for other specified aftercare: Secondary | ICD-10-CM | POA: Diagnosis not present

## 2014-02-12 ENCOUNTER — Telehealth: Payer: Self-pay

## 2014-02-12 ENCOUNTER — Ambulatory Visit: Payer: BC Managed Care – PPO | Admitting: Physical Therapy

## 2014-02-12 DIAGNOSIS — Z5189 Encounter for other specified aftercare: Secondary | ICD-10-CM | POA: Diagnosis not present

## 2014-02-12 DIAGNOSIS — M5030 Other cervical disc degeneration, unspecified cervical region: Secondary | ICD-10-CM

## 2014-02-12 NOTE — Telephone Encounter (Signed)
At this point, yes. I will place the referral.

## 2014-02-12 NOTE — Telephone Encounter (Signed)
Patient stated that she has had epidural done she is still having pain and she wants to know if a surgeon is recommended. Amber Riley,CMA

## 2014-02-13 NOTE — Telephone Encounter (Signed)
PATIENT HAS BEEN INFORMED. Rhonda Cunningham,CMA  

## 2014-02-14 ENCOUNTER — Ambulatory Visit: Payer: BC Managed Care – PPO | Admitting: Physical Therapy

## 2014-02-14 DIAGNOSIS — Z5189 Encounter for other specified aftercare: Secondary | ICD-10-CM | POA: Diagnosis not present

## 2014-02-16 ENCOUNTER — Ambulatory Visit
Admission: RE | Admit: 2014-02-16 | Discharge: 2014-02-16 | Disposition: A | Discharge status: Home / Self Care | Payer: BC Managed Care – PPO | Source: Ambulatory Visit | Attending: Sports Medicine | Admitting: Sports Medicine

## 2014-02-16 MED ORDER — IOHEXOL 180 MG/ML  SOLN
1.0000 mL | Freq: Once | INTRAMUSCULAR | Status: AC | PRN
Start: 2014-02-16 — End: 2014-02-16
  Administered 2014-02-16: 1 mL via EPIDURAL

## 2014-02-16 MED ORDER — METHYLPREDNISOLONE ACETATE 40 MG/ML INJ SUSP (RADIOLOG
120.0000 mg | Freq: Once | INTRAMUSCULAR | Status: AC
  Administered 2014-02-16: 120 mg via EPIDURAL

## 2014-02-16 NOTE — Discharge Instructions (Signed)

## 2014-02-20 ENCOUNTER — Ambulatory Visit: Payer: BC Managed Care – PPO | Admitting: Physical Therapy

## 2014-02-20 DIAGNOSIS — Z5189 Encounter for other specified aftercare: Secondary | ICD-10-CM | POA: Diagnosis not present

## 2014-02-23 ENCOUNTER — Encounter: Payer: BC Managed Care – PPO | Admitting: Physical Therapy

## 2014-02-27 ENCOUNTER — Ambulatory Visit (INDEPENDENT_AMBULATORY_CARE_PROVIDER_SITE_OTHER): Payer: BC Managed Care – PPO

## 2014-02-27 ENCOUNTER — Ambulatory Visit (INDEPENDENT_AMBULATORY_CARE_PROVIDER_SITE_OTHER): Payer: BC Managed Care – PPO | Admitting: Podiatry

## 2014-02-27 ENCOUNTER — Encounter: Payer: Self-pay | Admitting: Podiatry

## 2014-02-27 VITALS — BP 99/62 | HR 86 | Resp 16

## 2014-02-27 DIAGNOSIS — M2011 Hallux valgus (acquired), right foot: Secondary | ICD-10-CM

## 2014-02-27 DIAGNOSIS — Z9889 Other specified postprocedural states: Secondary | ICD-10-CM

## 2014-02-27 NOTE — Progress Notes (Signed)
She presents today for her final postop visit regarding bunion repairs and Akin osteotomies of the bilateral hallux. She seems to be doing quite well with this in the due to minimal edema no erythema cellulitis drainage or odor and she has good range of motion. First metatarsophalangeal joint is limited she states.  Objective: Vital signs are stable she's alert and oriented 3 strong palpable pulses bilateral no erythema and no edema good range of motion of the right hallux and slightly limited dorsiflexion of the left first metatarsophalangeal joint however this is doing much better. Radiographically she is completely healed from her osteotomies.  Assessment: Well-healing surgical forefoot bilateral.  Plan: I encouraged her to increase her activity level and her heel elevation on her shoes and continue her current massage therapy to the scar line. We will release her from our care regarding these surgeries at this time.

## 2014-03-14 ENCOUNTER — Ambulatory Visit: Payer: BC Managed Care – PPO | Admitting: Sports Medicine

## 2014-03-22 ENCOUNTER — Ambulatory Visit: Payer: BC Managed Care – PPO | Admitting: Sports Medicine

## 2014-04-06 ENCOUNTER — Encounter: Payer: Self-pay | Admitting: Sports Medicine

## 2014-04-06 ENCOUNTER — Ambulatory Visit (INDEPENDENT_AMBULATORY_CARE_PROVIDER_SITE_OTHER): Payer: BC Managed Care – PPO | Admitting: Sports Medicine

## 2014-04-06 DIAGNOSIS — M5416 Radiculopathy, lumbar region: Secondary | ICD-10-CM

## 2014-04-06 DIAGNOSIS — M5417 Radiculopathy, lumbosacral region: Secondary | ICD-10-CM

## 2014-04-06 NOTE — Assessment & Plan Note (Signed)
This pleasant 53 year old femalehas now had 3 lumbar epidurals for S1 distribution radiculopathy. Each epidural has helped somewhat, but unfortunately she continues to have pain, axial with radicular symptoms. Continue Flexeril, meloxicam, TENS unit. I would like her to see Dr. Rolena Infante with [facility] orthopedics for consideration of surgical intervention. It sounds as though she saw Dr. Alfonso Ramus with [facility], she has not yet seen a spine surgeon.

## 2014-04-06 NOTE — Progress Notes (Signed)
  Subjective:    CC: follow-up  HPI: Right lumbar radiculopathy: S1 distribution, MRI does show L5-S1 degenerative disc disease, she has responded to L5-S1 epidurals but more recently is starting to have a recurrence of pain sooner. She was seen at [facility] by Dr. Alfonso Ramus, but unfortunately has not yet seen a spine surgeon as requested by me. Symptoms are moderate, persistent, only slightly better with oral analgesics. She has already failed greater than 6 weeks of formal physical therapy, medications, NSAIDs, muscle relaxers, steroids, and epidurals as above. Pain is both axial and radicular.  Past medical history, Surgical history, Family history not pertinant except as noted below, Social history, Allergies, and medications have been entered into the medical record, reviewed, and no changes needed.   Review of Systems: No fevers, chills, night sweats, weight loss, chest pain, or shortness of breath.   Objective:    General: Well Developed, well nourished, and in no acute distress.  Neuro: Alert and oriented x3, extra-ocular muscles intact, sensation grossly intact.  HEENT: Normocephalic, atraumatic, pupils equal round reactive to light, neck supple, no masses, no lymphadenopathy, thyroid nonpalpable.  Skin: Warm and dry, no rashes. Cardiac: Regular rate and rhythm, no murmurs rubs or gallops, no lower extremity edema.  Respiratory: Clear to auscultation bilaterally. Not using accessory muscles, speaking in full sentences.  Impression and Recommendations:

## 2014-04-16 ENCOUNTER — Telehealth: Payer: Self-pay

## 2014-04-16 NOTE — Telephone Encounter (Signed)
Patient had called and left several message which was unclear. I attempted to call patient back at least 3 times before I left and could not get an answer or was I able to leave a vm. Rhonda Cunningham,CMA

## 2014-05-08 ENCOUNTER — Ambulatory Visit: Payer: BC Managed Care – PPO | Admitting: Sports Medicine

## 2014-05-17 ENCOUNTER — Ambulatory Visit (INDEPENDENT_AMBULATORY_CARE_PROVIDER_SITE_OTHER): Payer: BC Managed Care – PPO | Admitting: Sports Medicine

## 2014-05-17 ENCOUNTER — Encounter: Payer: Self-pay | Admitting: Sports Medicine

## 2014-05-17 DIAGNOSIS — M5417 Radiculopathy, lumbosacral region: Secondary | ICD-10-CM

## 2014-05-17 DIAGNOSIS — M5416 Radiculopathy, lumbar region: Secondary | ICD-10-CM
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
## 2014-05-17 NOTE — Assessment & Plan Note (Signed)
This pleasant 55 year old female has now had 3 lumbar epidurals for S1 distribution radiculopathy. Each epidural has helped somewhat, but unfortunately she continues to have pain, axial with radicular symptoms. Continue Flexeril, meloxicam, TENS unit. She has seen Dr. Rolena Infante with [facility] who recommended anterolateral interbody fusion. I am also going to set her up with a second opinion with [facility] downstairs.

## 2014-06-15 ENCOUNTER — Ambulatory Visit: Payer: BC Managed Care – PPO | Admitting: Sports Medicine

## 2014-06-25 ENCOUNTER — Other Ambulatory Visit (HOSPITAL_COMMUNITY): Payer: Self-pay | Admitting: Neurosurgery

## 2014-06-25 DIAGNOSIS — M5440 Lumbago with sciatica, unspecified side: Secondary | ICD-10-CM

## 2014-06-26 ENCOUNTER — Ambulatory Visit: Payer: BC Managed Care – PPO | Admitting: Sports Medicine

## 2014-07-11 ENCOUNTER — Encounter (HOSPITAL_COMMUNITY): Payer: Self-pay

## 2014-07-11 ENCOUNTER — Ambulatory Visit (HOSPITAL_COMMUNITY)
Admission: RE | Admit: 2014-07-11 | Discharge: 2014-07-11 | Disposition: A | Discharge status: Home / Self Care | Payer: BC Managed Care – PPO | Source: Ambulatory Visit | Attending: Neurosurgery | Admitting: Neurosurgery

## 2014-07-11 DIAGNOSIS — M5440 Lumbago with sciatica, unspecified side: Secondary | ICD-10-CM | POA: Diagnosis present

## 2014-07-11 DIAGNOSIS — M5117 Intervertebral disc disorders with radiculopathy, lumbosacral region: Secondary | ICD-10-CM | POA: Diagnosis not present

## 2014-07-11 HISTORY — DX: Lumbago with sciatica, unspecified side: M54.40

## 2014-07-11 MED ORDER — DIAZEPAM 5 MG PO TABS
ORAL_TABLET | ORAL | Status: AC
  Filled 2014-07-11: qty 2

## 2014-07-11 MED ORDER — IOHEXOL 180 MG/ML  SOLN
20.0000 mL | Freq: Once | INTRAMUSCULAR | Status: AC | PRN
Start: 2014-07-11 — End: 2014-07-11
  Administered 2014-07-11: 16 mL via INTRATHECAL

## 2014-07-11 MED ORDER — DIAZEPAM 5 MG PO TABS
10.0000 mg | ORAL_TABLET | Freq: Once | ORAL | Status: AC
  Administered 2014-07-11: 10 mg via ORAL

## 2014-07-11 MED ORDER — OXYCODONE HCL 5 MG PO TABS
ORAL_TABLET | ORAL | Status: AC
  Filled 2014-07-11: qty 1

## 2014-07-11 MED ORDER — CYCLOBENZAPRINE HCL 10 MG PO TABS
10.0000 mg | ORAL_TABLET | Freq: Three times a day (TID) | ORAL | Status: DC | PRN
  Filled 2014-07-11: qty 1

## 2014-07-11 MED ORDER — ONDANSETRON HCL 4 MG/2ML IJ SOLN: 4.0000 mg | Freq: Four times a day (QID) | INTRAMUSCULAR | Status: DC | PRN

## 2014-07-11 MED ORDER — OXYCODONE HCL 5 MG PO TABS
5.0000 mg | ORAL_TABLET | ORAL | Status: DC | PRN
  Administered 2014-07-11: 5 mg via ORAL

## 2014-07-11 NOTE — Op Note (Signed)
07/11/2014 Lumbar Myelogram  PATIENT:  Amber Riley is a 54 y.o. female  PRE-OPERATIVE DIAGNOSIS:  Lumbago with sciatica  POST-OPERATIVE DIAGNOSIS:  same  PROCEDURE:  Lumbar Myelogram  SURGEON:  Inaara Tye  ANESTHESIA:   local LOCAL MEDICATIONS USED:  LIDOCAINE  Procedure Note: Amber Riley is a 54 y.o. female Was taken to the fluoroscopy suite and  positioned prone on the fluoroscopy table. Her back was prepared and draped in a sterile manner. I infiltrated 5 cc into the lumbar region. I then introduced a spinal needle into the thecal sac at the L3/4 interlaminar space. I infiltrated 20cc of Omnipaque 180 into the thecal sac. Fluoroscopy showed the needle and contrast in the thecal sac. Amber Riley tolerated the procedure well. she Will be taken to CT for evaluation.     PATIENT DISPOSITION:  Short Stay

## 2014-07-11 NOTE — Discharge Instructions (Signed)
Myelogram and Lumbar Puncture Discharge Instructions ° °1. Go home and rest quietly for the next 24 hours.  It is important to lie flat for the next 24 hours.  Get up only to go to the restroom.  You may lie in the bed or on a couch on your back, your stomach, your left side or your right side.  You may have one pillow under your head.  You may have pillows between your knees while you are on your side or under your knees while you are on your back. ° °2. DO NOT drive today.  Recline the seat as far back as it will go, while still wearing your seat belt, on the way home. ° °3. You may get up to go to the bathroom as needed.  You may sit up for 10 minutes to eat.  You may resume your normal diet and medications unless otherwise indicated. ° °4. The incidence of headache, nausea, or vomiting is about 5% (one in 20 patients).  If you develop a headache, lie flat and drink plenty of fluids until the headache goes away.  Caffeinated beverages may be helpful.  If you develop severe nausea and vomiting or a headache that does not go away with flat bed rest, call Dr Cabbell. ° °5. You may resume normal activities after your 24 hours of bed rest is over; however, do not exert yourself strongly or do any heavy lifting tomorrow. ° °6. Call your physician for a follow-up appointment.  The results of your myelogram will be sent directly to your physician by the following day. ° °7. If you have any questions or if complications develop after you arrive home, please call Dr Cabbell. ° °Discharge instructions have been explained to the patient.  The patient, or the person responsible for the patient, fully understands these instructions. ° ° °

## 2014-07-11 NOTE — Progress Notes (Signed)
Ok to d/c at 1300 per Dr Christella Noa

## 2014-08-02 ENCOUNTER — Ambulatory Visit (INDEPENDENT_AMBULATORY_CARE_PROVIDER_SITE_OTHER): Payer: BC Managed Care – PPO | Admitting: Sports Medicine

## 2014-08-02 ENCOUNTER — Encounter: Payer: Self-pay | Admitting: Sports Medicine

## 2014-08-02 DIAGNOSIS — M5416 Radiculopathy, lumbar region: Secondary | ICD-10-CM

## 2014-08-02 DIAGNOSIS — M5417 Radiculopathy, lumbosacral region: Secondary | ICD-10-CM | POA: Diagnosis not present

## 2014-08-02 MED ORDER — PREGABALIN 75 MG PO CAPS: 75.0000 mg | ORAL_CAPSULE | Freq: Two times a day (BID) | ORAL | Status: DC

## 2014-08-02 NOTE — Progress Notes (Signed)
  Subjective:    CC: Follow-up  HPI: Lumbar spondylosis: We have now been through 2 surgeons, they both recommended operative intervention however we have not done much in terms of medical intervention. She has only had temporary responses to S1 epidurals. Symptoms are mild, persistent. No bowel or bladder dysfunction or saddle numbness.  Past medical history, Surgical history, Family history not pertinant except as noted below, Social history, Allergies, and medications have been entered into the medical record, reviewed, and no changes needed.   Review of Systems: No fevers, chills, night sweats, weight loss, chest pain, or shortness of breath.   Objective:    General: Well Developed, well nourished, and in no acute distress.  Neuro: Alert and oriented x3, extra-ocular muscles intact, sensation grossly intact.  HEENT: Normocephalic, atraumatic, pupils equal round reactive to light, neck supple, no masses, no lymphadenopathy, thyroid nonpalpable.  Skin: Warm and dry, no rashes. Cardiac: Regular rate and rhythm, no murmurs rubs or gallops, no lower extremity edema.  Respiratory: Clear to auscultation bilaterally. Not using accessory muscles, speaking in full sentences.  Impression and Recommendations:

## 2014-08-02 NOTE — Assessment & Plan Note (Signed)
Has now seen both neurosurgery and orthospine surgery, operative intervention is recommended. She would like to hold off for now which is acceptable, and we will start low-dose Lyrica. Return in one month.

## 2014-08-20 ENCOUNTER — Other Ambulatory Visit: Payer: Self-pay | Admitting: Sports Medicine

## 2014-08-23 ENCOUNTER — Other Ambulatory Visit: Payer: Self-pay | Admitting: Sports Medicine

## 2014-08-28 ENCOUNTER — Other Ambulatory Visit: Payer: Self-pay | Admitting: Sports Medicine

## 2014-08-30 ENCOUNTER — Encounter: Payer: Self-pay | Admitting: Sports Medicine

## 2014-08-30 ENCOUNTER — Ambulatory Visit (INDEPENDENT_AMBULATORY_CARE_PROVIDER_SITE_OTHER): Payer: BC Managed Care – PPO | Admitting: Sports Medicine

## 2014-08-30 DIAGNOSIS — M5416 Radiculopathy, lumbar region: Secondary | ICD-10-CM

## 2014-08-30 DIAGNOSIS — M5417 Radiculopathy, lumbosacral region: Secondary | ICD-10-CM | POA: Diagnosis not present

## 2014-08-30 MED ORDER — MELOXICAM 15 MG PO TABS: 15.0000 mg | ORAL_TABLET | Freq: Every day | ORAL | Status: DC

## 2014-08-30 MED ORDER — TRAMADOL HCL 50 MG PO TABS
ORAL_TABLET | ORAL | Status: DC
Start: 2014-08-30 — End: 2015-01-03

## 2014-08-30 MED ORDER — MELOXICAM 15 MG PO TABS
ORAL_TABLET | ORAL | Status: DC
Start: 2014-08-30 — End: 2014-08-30
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
## 2014-08-30 NOTE — Assessment & Plan Note (Signed)
Excessive drowsiness with Lyrica. Continue mobic daily, adding tramadol for breakthrough pain.

## 2014-09-09 ENCOUNTER — Emergency Department (INDEPENDENT_AMBULATORY_CARE_PROVIDER_SITE_OTHER)
Admission: EM | Admit: 2014-09-09 | Discharge: 2014-09-09 | Disposition: A | Discharge status: Home / Self Care | Payer: BC Managed Care – PPO | Source: Home / Self Care

## 2014-09-09 ENCOUNTER — Encounter (HOSPITAL_COMMUNITY): Payer: Self-pay | Admitting: *Deleted

## 2014-09-09 DIAGNOSIS — J0101 Acute recurrent maxillary sinusitis: Secondary | ICD-10-CM

## 2014-09-09 HISTORY — DX: Narcolepsy without cataplexy: G47.419

## 2014-09-09 MED ORDER — IPRATROPIUM BROMIDE 0.06 % NA SOLN: 2.0000 | Freq: Four times a day (QID) | NASAL | Status: DC

## 2014-09-09 MED ORDER — AMOXICILLIN 500 MG PO CAPS: 500.0000 mg | ORAL_CAPSULE | Freq: Three times a day (TID) | ORAL | Status: DC

## 2014-09-09 NOTE — ED Notes (Signed)
Started with sore throat and right earache 5/1.  Yesterday pain increased, started with bloody nasal discharge and spots of blood in sputum.  Has tried OTC allergy med, sinus congestion med, Tussin, and chloraseptic.

## 2014-09-09 NOTE — ED Provider Notes (Addendum)
CSN: [id number]     Arrival date & time 09/09/14  1243 History   None    Chief Complaint  Patient presents with  . Cough  . Otalgia  . Sore Throat   (Consider location/radiation/quality/duration/timing/severity/associated sxs/prior Treatment) Patient is a 54 y.o. female presenting with cough. The history is provided by the patient.  Cough Cough characteristics:  Non-productive and dry Severity:  Mild Onset quality:  Gradual Progression:  Worsening Chronicity:  New Smoker: no   Context: exposure to allergens and upper respiratory infection   Associated symptoms: ear fullness, ear pain, rhinorrhea and sore throat   Associated symptoms: no fever, no rash and no shortness of breath     Past Medical History  Diagnosis Date  . Migraine   . Back pain   . Goiter   . Narcolepsy    Past Surgical History  Procedure Laterality Date  . Abdominal hysterectomy    . Eye surgery    . Bilateral foot surgery     No family history on file. History  Substance Use Topics  . Smoking status: Never Smoker   . Smokeless tobacco: Not on file  . Alcohol Use: Yes     Comment: occasional   OB History    No data available     Review of Systems  Constitutional: Negative.  Negative for fever.  HENT: Positive for congestion, ear pain, postnasal drip, rhinorrhea and sore throat.   Respiratory: Positive for cough. Negative for shortness of breath.   Cardiovascular: Negative.   Gastrointestinal: Negative.   Skin: Negative for rash.    Allergies  Review of patient's allergies indicates no known allergies.  Home Medications   Prior to Admission medications   Medication Sig Start Date End Date Taking? Authorizing Provider  CYCLOBENZAPRINE HCL PO Take by mouth.   Yes Historical Provider, MD  DICLOFENAC PO Take by mouth.   Yes Historical Provider, MD  estradiol (ESTRACE) 1 MG tablet Take 1 mg by mouth daily.   Yes Historical Provider, MD  loratadine (CLARITIN) 10 MG tablet Take 10 mg by mouth  daily.   Yes Historical Provider, MD  meloxicam (MOBIC) 15 MG tablet Take 1 tablet (15 mg total) by mouth daily. 08/30/14  Yes Silverio Decamp, MD  amoxicillin (AMOXIL) 500 MG capsule Take 1 capsule (500 mg total) by mouth 3 (three) times daily. 09/09/14   Billy Fischer, MD  ipratropium (ATROVENT) 0.06 % nasal spray Place 2 sprays into both nostrils 4 (four) times daily. 09/09/14   Billy Fischer, MD  Multiple Vitamin (MULTIVITAMIN) tablet Take 1 tablet by mouth daily.    Historical Provider, MD  traMADol (ULTRAM) 50 MG tablet 1-2 tabs by mouth Q8 hours, maximum 6 tabs per day. 08/30/14   Silverio Decamp, MD   BP 113/70 mmHg  Pulse 81  Temp(Src) 97.8 F (36.6 C) (Oral)  Resp 16  SpO2 100% Physical Exam  Constitutional: She is oriented to person, place, and time. She appears well-developed and well-nourished. No distress.  HENT:  Right Ear: External ear normal.  Left Ear: External ear normal.  Mouth/Throat: Oropharynx is clear and moist.  Eyes: EOM are normal. Pupils are equal, round, and reactive to light.  Neck: Normal range of motion. Neck supple.  Pulmonary/Chest: Effort normal and breath sounds normal.  Lymphadenopathy:    She has no cervical adenopathy.  Neurological: She is alert and oriented to person, place, and time.  Skin: Skin is warm and dry.  Nursing note and  vitals reviewed.   ED Course  Procedures (including critical care time) Labs Review Labs Reviewed - No data to display  Imaging Review No results found.   MDM   1. Acute recurrent maxillary sinusitis       Billy Fischer, MD 09/09/14 May, MD 09/09/14 1320

## 2014-10-04 ENCOUNTER — Other Ambulatory Visit: Payer: Self-pay | Admitting: Neurosurgery

## 2014-10-10 ENCOUNTER — Other Ambulatory Visit: Payer: Self-pay

## 2014-10-11 ENCOUNTER — Encounter: Payer: Self-pay | Admitting: Vascular Surgery

## 2014-10-15 ENCOUNTER — Encounter: Payer: Self-pay | Admitting: Vascular Surgery

## 2014-10-18 ENCOUNTER — Encounter: Payer: Self-pay | Admitting: Vascular Surgery

## 2014-10-18 ENCOUNTER — Ambulatory Visit (INDEPENDENT_AMBULATORY_CARE_PROVIDER_SITE_OTHER): Payer: BC Managed Care – PPO | Admitting: Vascular Surgery

## 2014-10-18 VITALS — BP 108/73 | HR 84 | Ht 65.5 in | Wt 161.6 lb

## 2014-10-18 DIAGNOSIS — M5137 Other intervertebral disc degeneration, lumbosacral region: Secondary | ICD-10-CM
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
## 2014-10-31 ENCOUNTER — Encounter: Payer: BC Managed Care – PPO | Admitting: Vascular Surgery

## 2014-11-07 NOTE — Pre-Procedure Instructions (Signed)
Amber Riley  11/07/2014      [facility] - [place] ([place]), [place] [address] [place] ([place]) [place] [address] Phone: [phone] Fax: [phone]  [place] - [address] [place] [address] Phone: [phone] Fax: [phone]    Your procedure is scheduled on Fri, July 15 @ 9:00 AM  Report to [facility] Admitting at 6:00 AM  Call this number if you have problems the morning of surgery:  [phone]   Remember:  Do not eat food or drink liquids after midnight.  Take these medicines the morning of surgery with A SIP OF WATER Atrovent(Ipratriopium),Claritin(Loratadine),and Tramadol(Ultram-if needed)               Stop taking your Mobic and Diclofenac. NO Goody's,BC's,ALeve,Aspirin,Ibuprofen,Fish Oil,or any Herbal Medications.   Do not wear jewelry, make-up or nail polish.  Do not wear lotions, powders, or perfumes.  You may wear deodorant.  Do not shave 48 hours prior to surgery.  Men may shave face and neck.  Do not bring valuables to the hospital.  [facility] is not responsible for any belongings or valuables.  Contacts, dentures or bridgework may not be worn into surgery.  Leave your suitcase in the car.  After surgery it may be brought to your room.  For patients admitted to the hospital, discharge time will be determined by your treatment team.  Patients discharged the day of surgery will not be allowed to drive home.    Special instructions:  [place] - Preparing for Surgery  Before surgery, you can play an important role.  Because skin is not sterile, your skin needs to be as free of germs as possible.  You can reduce the number of germs on you skin by washing with CHG (chlorahexidine gluconate) soap before surgery.  CHG is an antiseptic cleaner which kills germs and bonds with the skin to continue killing germs even  after washing.  Please DO NOT use if you have an allergy to CHG or antibacterial soaps.  If your skin becomes reddened/irritated stop using the CHG and inform your nurse when you arrive at Short Stay.  Do not shave (including legs and underarms) for at least 48 hours prior to the first CHG shower.  You may shave your face.  Please follow these instructions carefully:   1.  Shower with CHG Soap the night before surgery and the                                morning of Surgery.  2.  If you choose to wash your hair, wash your hair first as usual with your       normal shampoo.  3.  After you shampoo, rinse your hair and body thoroughly to remove the                      Shampoo.  4.  Use CHG as you would any other liquid soap.  You can apply chg directly       to the skin and wash gently with scrungie or a clean washcloth.  5.  Apply the CHG Soap to your body ONLY FROM THE NECK DOWN.        Do not use on open wounds or open sores.  Avoid contact with your eyes,       ears, mouth and genitals (private parts).  Wash genitals (private parts)       with your normal soap.  6.  Wash thoroughly, paying special attention to the area where your surgery        will be performed.  7.  Thoroughly rinse your body with warm water from the neck down.  8.  DO NOT shower/wash with your normal soap after using and rinsing off       the CHG Soap.  9.  Pat yourself dry with a clean towel.            10.  Wear clean pajamas.            11.  Place clean sheets on your bed the night of your first shower and do not        sleep with pets.  Day of Surgery  Do not apply any lotions/deoderants the morning of surgery.  Please wear clean clothes to the hospital/surgery center.    Please read over the following fact sheets that you were given. Pain Booklet, Coughing and Deep Breathing, Blood Transfusion Information, MRSA Information and Surgical Site Infection Prevention

## 2014-11-08 ENCOUNTER — Encounter (HOSPITAL_COMMUNITY): Payer: Self-pay

## 2014-11-08 ENCOUNTER — Encounter (HOSPITAL_COMMUNITY)
Admission: RE | Admit: 2014-11-08 | Discharge: 2014-11-08 | Disposition: A | Discharge status: Home / Self Care | Payer: BC Managed Care – PPO | Source: Ambulatory Visit | Attending: Neurosurgery | Admitting: Neurosurgery

## 2014-11-08 DIAGNOSIS — Z01812 Encounter for preprocedural laboratory examination: Secondary | ICD-10-CM | POA: Diagnosis not present

## 2014-11-08 DIAGNOSIS — Z0183 Encounter for blood typing: Secondary | ICD-10-CM | POA: Insufficient documentation

## 2014-11-08 HISTORY — DX: Gastro-esophageal reflux disease without esophagitis: K21.9

## 2014-11-08 HISTORY — DX: Major depressive disorder, single episode, unspecified: F32.9

## 2014-11-08 HISTORY — DX: Nausea with vomiting, unspecified: R11.2

## 2014-11-08 HISTORY — DX: Hyperlipidemia, unspecified: E78.5

## 2014-11-08 HISTORY — DX: Anemia, unspecified: D64.9

## 2014-11-08 HISTORY — DX: Dorsalgia, unspecified: M54.9

## 2014-11-08 HISTORY — DX: Other chronic pain: G89.29

## 2014-11-08 HISTORY — DX: Urinary tract infection, site not specified: N39.0

## 2014-11-08 HISTORY — DX: Other seasonal allergic rhinitis: J30.2

## 2014-11-08 HISTORY — DX: Other muscle spasm: M62.838

## 2014-11-08 HISTORY — DX: Personal history of other mental and behavioral disorders: Z86.59

## 2014-11-08 HISTORY — DX: Weakness: R53.1

## 2014-11-08 HISTORY — DX: Depression, unspecified: F32.A

## 2014-11-08 HISTORY — DX: Personal history of other diseases of the nervous system and sense organs: Z86.69

## 2014-11-08 HISTORY — DX: Other specified postprocedural states: Z98.890

## 2014-11-08 HISTORY — DX: Constipation, unspecified: K59.00

## 2014-11-08 LAB — CBC
HCT: 41.3 % (ref 36.0–46.0)
Hemoglobin: 13.4 g/dL (ref 12.0–15.0)
MCH: 28.4 pg (ref 26.0–34.0)
MCHC: 32.4 g/dL (ref 30.0–36.0)
MCV: 87.5 fL (ref 78.0–100.0)
Platelets: 244 10*3/uL (ref 150–400)
RBC: 4.72 MIL/uL (ref 3.87–5.11)
RDW: 13.4 % (ref 11.5–15.5)
WBC: 5.5 10*3/uL (ref 4.0–10.5)

## 2014-11-08 LAB — SURGICAL PCR SCREEN
MRSA, PCR: NEGATIVE
Staphylococcus aureus: NEGATIVE

## 2014-11-08 LAB — BASIC METABOLIC PANEL
Anion gap: 7 (ref 5–15)
BUN: 14 mg/dL (ref 6–20)
CO2: 24 mmol/L (ref 22–32)
Calcium: 9.3 mg/dL (ref 8.9–10.3)
Chloride: 107 mmol/L (ref 101–111)
Creatinine, Ser: 0.83 mg/dL (ref 0.44–1.00)
GFR calc Af Amer: >60 mL/min (ref >60)
GFR calc non Af Amer: >60 mL/min (ref >60)
Glucose, Bld: 80 mg/dL (ref 65–99)
Potassium: 3.9 mmol/L (ref 3.5–5.1)
Sodium: 138 mmol/L (ref 135–145)

## 2014-11-08 LAB — TYPE AND SCREEN
ABO/RH(D): O POS
Antibody Screen: NEGATIVE

## 2014-11-08 LAB — ABO/RH: ABO/RH(D): O POS

## 2014-11-08 MED ORDER — CHLORHEXIDINE GLUCONATE 4 % EX LIQD: 60.0000 mL | Freq: Once | CUTANEOUS | Status: DC

## 2014-11-08 NOTE — Progress Notes (Addendum)
Cardiologist denies having one  Medical Md is [place] test denies ever having one  Echo denies ever having one  Heart cath denies ever having one  EKG denies in past yr  CXR denies in past yr

## 2014-11-16 ENCOUNTER — Encounter (HOSPITAL_COMMUNITY): Admission: RE | Payer: Self-pay | Source: Ambulatory Visit

## 2014-11-16 ENCOUNTER — Inpatient Hospital Stay (HOSPITAL_COMMUNITY): Admission: RE | Admit: 2014-11-16 | Payer: BC Managed Care – PPO | Source: Ambulatory Visit | Admitting: Neurosurgery

## 2014-11-16 SURGERY — ANTERIOR LUMBAR FUSION 1 LEVEL
Anesthesia: General

## 2014-11-21 ENCOUNTER — Other Ambulatory Visit (HOSPITAL_COMMUNITY): Payer: Self-pay | Admitting: Family Medicine

## 2014-11-21 DIAGNOSIS — Z1231 Encounter for screening mammogram for malignant neoplasm of breast: Secondary | ICD-10-CM

## 2014-12-17 ENCOUNTER — Ambulatory Visit (HOSPITAL_COMMUNITY)
Admission: RE | Admit: 2014-12-17 | Discharge: 2014-12-17 | Disposition: A | Discharge status: Home / Self Care | Payer: BC Managed Care – PPO | Source: Ambulatory Visit | Attending: Family Medicine | Admitting: Family Medicine

## 2014-12-17 ENCOUNTER — Other Ambulatory Visit (HOSPITAL_COMMUNITY): Payer: Self-pay | Admitting: Family Medicine

## 2014-12-17 DIAGNOSIS — Z1231 Encounter for screening mammogram for malignant neoplasm of breast: Secondary | ICD-10-CM

## 2015-01-03 ENCOUNTER — Emergency Department (HOSPITAL_COMMUNITY)
Admission: EM | Admit: 2015-01-03 | Discharge: 2015-01-03 | Disposition: A | Discharge status: Home / Self Care | Payer: BC Managed Care – PPO | Attending: Physician Assistant | Admitting: Physician Assistant

## 2015-01-03 ENCOUNTER — Emergency Department (HOSPITAL_COMMUNITY)
Admission: EM | Admit: 2015-01-03 | Discharge: 2015-01-03 | Disposition: A | Discharge status: Nursing Home (Includes ICF) | Payer: BC Managed Care – PPO | Source: Home / Self Care | Attending: Family Medicine | Admitting: Family Medicine

## 2015-01-03 ENCOUNTER — Encounter (HOSPITAL_COMMUNITY): Payer: Self-pay | Admitting: Emergency Medicine

## 2015-01-03 DIAGNOSIS — K5900 Constipation, unspecified: Secondary | ICD-10-CM | POA: Diagnosis not present

## 2015-01-03 DIAGNOSIS — G44031 Episodic paroxysmal hemicrania, intractable: Secondary | ICD-10-CM

## 2015-01-03 DIAGNOSIS — Z862 Personal history of diseases of the blood and blood-forming organs and certain disorders involving the immune mechanism: Secondary | ICD-10-CM | POA: Insufficient documentation

## 2015-01-03 DIAGNOSIS — G43009 Migraine without aura, not intractable, without status migrainosus: Secondary | ICD-10-CM

## 2015-01-03 DIAGNOSIS — Z79899 Other long term (current) drug therapy: Secondary | ICD-10-CM | POA: Insufficient documentation

## 2015-01-03 DIAGNOSIS — F329 Major depressive disorder, single episode, unspecified: Secondary | ICD-10-CM | POA: Insufficient documentation

## 2015-01-03 DIAGNOSIS — Z791 Long term (current) use of non-steroidal anti-inflammatories (NSAID): Secondary | ICD-10-CM | POA: Insufficient documentation

## 2015-01-03 DIAGNOSIS — G43909 Migraine, unspecified, not intractable, without status migrainosus: Secondary | ICD-10-CM | POA: Diagnosis not present

## 2015-01-03 DIAGNOSIS — Z87440 Personal history of urinary (tract) infections: Secondary | ICD-10-CM | POA: Diagnosis not present

## 2015-01-03 DIAGNOSIS — M62838 Other muscle spasm: Secondary | ICD-10-CM | POA: Insufficient documentation

## 2015-01-03 DIAGNOSIS — Z8639 Personal history of other endocrine, nutritional and metabolic disease: Secondary | ICD-10-CM | POA: Insufficient documentation

## 2015-01-03 DIAGNOSIS — R51 Headache: Secondary | ICD-10-CM | POA: Diagnosis present

## 2015-01-03 DIAGNOSIS — G8929 Other chronic pain: Secondary | ICD-10-CM | POA: Insufficient documentation

## 2015-01-03 LAB — CBC WITH DIFFERENTIAL/PLATELET
Basophils Absolute: 0 10*3/uL (ref 0.0–0.1)
Basophils Relative: 0 % (ref 0–1)
Eosinophils Absolute: 0.1 10*3/uL (ref 0.0–0.7)
Eosinophils Relative: 1 % (ref 0–5)
HCT: 43.5 % (ref 36.0–46.0)
Hemoglobin: 14.5 g/dL (ref 12.0–15.0)
Lymphocytes Relative: 61 % — ABNORMAL HIGH (ref 12–46)
Lymphs Abs: 3.4 10*3/uL (ref 0.7–4.0)
MCH: 29.3 pg (ref 26.0–34.0)
MCHC: 33.3 g/dL (ref 30.0–36.0)
MCV: 87.9 fL (ref 78.0–100.0)
Monocytes Absolute: 0.4 10*3/uL (ref 0.1–1.0)
Monocytes Relative: 6 % (ref 3–12)
Neutro Abs: 1.8 10*3/uL (ref 1.7–7.7)
Neutrophils Relative %: 32 % — ABNORMAL LOW (ref 43–77)
Platelets: 186 10*3/uL (ref 150–400)
RBC: 4.95 MIL/uL (ref 3.87–5.11)
RDW: 12.7 % (ref 11.5–15.5)
WBC: 5.7 10*3/uL (ref 4.0–10.5)

## 2015-01-03 LAB — BASIC METABOLIC PANEL
Anion gap: 8 (ref 5–15)
BUN: 13 mg/dL (ref 6–20)
CO2: 25 mmol/L (ref 22–32)
Calcium: 9.5 mg/dL (ref 8.9–10.3)
Chloride: 102 mmol/L (ref 101–111)
Creatinine, Ser: 0.94 mg/dL (ref 0.44–1.00)
GFR calc Af Amer: >60 mL/min (ref >60)
GFR calc non Af Amer: >60 mL/min (ref >60)
Glucose, Bld: 82 mg/dL (ref 65–99)
Potassium: 4.7 mmol/L (ref 3.5–5.1)
Sodium: 135 mmol/L (ref 135–145)

## 2015-01-03 MED ORDER — CYCLOBENZAPRINE HCL 10 MG PO TABS: 10.0000 mg | ORAL_TABLET | Freq: Two times a day (BID) | ORAL | Status: DC | PRN

## 2015-01-03 MED ORDER — PROCHLORPERAZINE EDISYLATE 5 MG/ML IJ SOLN
10.0000 mg | Freq: Once | INTRAMUSCULAR | Status: AC
  Administered 2015-01-03: 10 mg via INTRAVENOUS
  Filled 2015-01-03: qty 2

## 2015-01-03 MED ORDER — SODIUM CHLORIDE 0.9 % IV BOLUS (SEPSIS)
1000.0000 mL | Freq: Once | INTRAVENOUS | Status: AC
  Administered 2015-01-03: 1000 mL via INTRAVENOUS

## 2015-01-03 MED ORDER — DIPHENHYDRAMINE HCL 50 MG/ML IJ SOLN
25.0000 mg | Freq: Once | INTRAMUSCULAR | Status: AC
  Administered 2015-01-03: 25 mg via INTRAVENOUS
  Filled 2015-01-03: qty 1

## 2015-01-03 NOTE — ED Provider Notes (Signed)
CSN: [id number]     Arrival date & time 01/03/15  1852 History   First MD Initiated Contact with Patient 01/03/15 1936     Chief Complaint  Patient presents with  . Migraine  . Generalized Body Aches   (Consider location/radiation/quality/duration/timing/severity/associated sxs/prior Treatment) Patient is a 54 y.o. female presenting with migraines. The history is provided by the patient.  Migraine This is a recurrent problem. Episode onset: onset 12/19/2014 no aura. The problem has not changed since onset.Associated symptoms include headaches. Pertinent negatives include no abdominal pain. Associated symptoms comments: Sx waxing and waning , sl nausea, mod photophobia, no fhx of migraines, pt s/p hysterectomy. No menses..    Past Medical History  Diagnosis Date  . Migraine   . Narcolepsy     was on meds yrs ago;will take Melatonin if needed  . Seasonal allergies     takes Claritin daily  . Muscle spasm     takes Flexeril daily as needed  . Chronic back pain     DDD/scoliosis  . PONV (postoperative nausea and vomiting)   . Hyperlipidemia     was on meds but has been off for yrs-told much improved  . History of migraine     last one a week ago  . Weakness     numbness and tingling down right left  . GERD (gastroesophageal reflux disease)     occasionally but no meds  . Constipation     takes OTC stool softener,laxatives,or enema  . Anemia     hx of-was on iron pills yrs ago  . UTI (lower urinary tract infection)     treated in May with antibiotic  . Depression     not on any meds  . History of panic attacks     doesn't take meds   Past Surgical History  Procedure Laterality Date  . Abdominal hysterectomy    . Bilateral foot surgery    . Myomectomy      to remove fibroids  . Knot removed from left side of neck    . Eye surgery Left   . Colonoscopy     History reviewed. No pertinent family history. Social History  Substance Use Topics  . Smoking status: Never  Smoker   . Smokeless tobacco: None  . Alcohol Use: 0.0 oz/week    0 Standard drinks or equivalent per week     Comment: occasionally   OB History    No data available     Review of Systems  Constitutional: Negative for fever.  Eyes: Positive for photophobia.  Gastrointestinal: Positive for nausea. Negative for abdominal pain.  Skin: Negative.   Neurological: Positive for weakness and headaches.       Vague right arm sx.    Allergies  Review of patient's allergies indicates no known allergies.  Home Medications   Prior to Admission medications   Medication Sig Start Date End Date Taking? Authorizing Provider  cyclobenzaprine (FLEXERIL) 5 MG tablet Take 5 mg by mouth daily as needed for muscle spasms.     Historical Provider, MD  diclofenac (VOLTAREN) 75 MG EC tablet Take 75 mg by mouth 2 (two) times daily.    Historical Provider, MD  estradiol (ESTRACE) 1 MG tablet Take 1 mg by mouth daily.    Historical Provider, MD  Garcinia Cambogia-Chromium 500-200 MG-MCG TABS Take 2 tablets by mouth daily.    Historical Provider, MD  ipratropium (ATROVENT) 0.06 % nasal spray Place 2 sprays into both nostrils 4 (  four) times daily. 09/09/14   Billy Fischer, MD  loratadine (CLARITIN) 10 MG tablet Take 10 mg by mouth daily as needed for allergies.     Historical Provider, MD  meloxicam (MOBIC) 15 MG tablet Take 1 tablet (15 mg total) by mouth daily. 08/30/14   Silverio Decamp, MD  Methen-Hyosc-Meth Blue-Na Phos (UROLET MB) 81.6 MG TABS Take 1 tablet by mouth 4 (four) times daily as needed.    Historical Provider, MD  traMADol (ULTRAM) 50 MG tablet 1-2 tabs by mouth Q8 hours, maximum 6 tabs per day. Patient not taking: Reported on 11/08/2014 08/30/14   Silverio Decamp, MD   Meds Ordered and Administered this Visit  Medications - No data to display  BP 135/60 mmHg  Pulse 67  Temp(Src) 97.9 F (36.6 C) (Oral)  Resp 16  SpO2 100% No data found.   Physical Exam  Constitutional: She  is oriented to person, place, and time. She appears well-developed and well-nourished. She appears distressed.  HENT:  Head: Normocephalic.  Right Ear: External ear normal.  Left Ear: External ear normal.  Eyes: Conjunctivae and EOM are normal. Pupils are equal, round, and reactive to light.  Neck: Normal range of motion. Neck supple.  Cardiovascular: Normal heart sounds and intact distal pulses.   Pulmonary/Chest: Effort normal and breath sounds normal.  Musculoskeletal: She exhibits no edema.  Lymphadenopathy:    She has no cervical adenopathy.  Neurological: She is alert and oriented to person, place, and time. No cranial nerve deficit. She exhibits normal muscle tone. Coordination normal.  Skin: Skin is warm and dry.  Nursing note and vitals reviewed.   ED Course  Procedures (including critical care time)  Labs Review Labs Reviewed - No data to display  Imaging Review No results found.   Visual Acuity Review  Right Eye Distance:   Left Eye Distance:   Bilateral Distance:    Right Eye Near:   Left Eye Near:    Bilateral Near:         MDM   1. Intractable episodic paroxysmal hemicrania        Billy Fischer, MD 01/03/15 1945

## 2015-01-03 NOTE — ED Notes (Signed)
C/o migraine  States she has neck and shoulder pain when she has headaches Used aleve, bc powder and icy hot as tx

## 2015-01-03 NOTE — ED Provider Notes (Signed)
CSN: [id number]     Arrival date & time 01/03/15  1951 History   First MD Initiated Contact with Patient 01/03/15 2037     Chief Complaint  Patient presents with  . Headache     (Consider location/radiation/quality/duration/timing/severity/associated sxs/prior Treatment) HPI   Patient is a 54 old female with history of migraines presenting today with migraine. She's had this headache for the last 2 weeks. Patient states taking ibuprofen at home which has not helped. She also feels that she has a muscle spasm in her right trapezius. Patient went to her physician today who sent her here. Patient had no red flags in dizziness no vision changes no pain over her temporal region and no morning headaches.  Past Medical History  Diagnosis Date  . Migraine   . Narcolepsy     was on meds yrs ago;will take Melatonin if needed  . Seasonal allergies     takes Claritin daily  . Muscle spasm     takes Flexeril daily as needed  . Chronic back pain     DDD/scoliosis  . PONV (postoperative nausea and vomiting)   . Hyperlipidemia     was on meds but has been off for yrs-told much improved  . History of migraine     last one a week ago  . Weakness     numbness and tingling down right left  . GERD (gastroesophageal reflux disease)     occasionally but no meds  . Constipation     takes OTC stool softener,laxatives,or enema  . Anemia     hx of-was on iron pills yrs ago  . UTI (lower urinary tract infection)     treated in May with antibiotic  . Depression     not on any meds  . History of panic attacks     doesn't take meds   Past Surgical History  Procedure Laterality Date  . Abdominal hysterectomy    . Bilateral foot surgery    . Myomectomy      to remove fibroids  . Knot removed from left side of neck    . Eye surgery Left   . Colonoscopy     No family history on file. Social History  Substance Use Topics  . Smoking status: Never Smoker   . Smokeless tobacco: None  . Alcohol  Use: 0.0 oz/week    0 Standard drinks or equivalent per week     Comment: occasionally   OB History    No data available     Review of Systems  Constitutional: Negative for activity change and fatigue.  HENT: Negative for congestion and drooling.   Eyes: Negative for discharge.  Respiratory: Negative for cough and chest tightness.   Cardiovascular: Negative for chest pain.  Gastrointestinal: Negative for abdominal distention.  Genitourinary: Negative for dysuria and difficulty urinating.  Musculoskeletal: Negative for joint swelling.  Skin: Negative for rash.  Allergic/Immunologic: Negative for immunocompromised state.  Neurological: Positive for headaches. Negative for dizziness, seizures, facial asymmetry, speech difficulty, weakness, light-headedness and numbness.  Psychiatric/Behavioral: Negative for behavioral problems and agitation.      Allergies  Review of patient's allergies indicates no known allergies.  Home Medications   Prior to Admission medications   Medication Sig Start Date End Date Taking? Authorizing Provider  cyclobenzaprine (FLEXERIL) 5 MG tablet Take 5 mg by mouth daily as needed for muscle spasms.    Yes Historical Provider, MD  diclofenac (VOLTAREN) 75 MG EC tablet Take 75 mg by  mouth 2 (two) times daily.    Historical Provider, MD  escitalopram (LEXAPRO) 5 MG tablet Take 5 mg by mouth daily. 12/25/14   Historical Provider, MD  estradiol (ESTRACE) 1 MG tablet Take 1 mg by mouth daily.    Historical Provider, MD  Garcinia Cambogia-Chromium 500-200 MG-MCG TABS Take 2 tablets by mouth daily.    Historical Provider, MD  loratadine (CLARITIN) 10 MG tablet Take 10 mg by mouth daily as needed for allergies.     Historical Provider, MD   BP 119/48 mmHg  Pulse 63  Temp(Src) 97.6 F (36.4 C)  Resp 18  Ht 5\' 5"  (1.651 m)  Wt 160 lb (72.576 kg)  BMI 26.63 kg/m2  SpO2 98% Physical Exam  Constitutional: She is oriented to person, place, and time. She appears  well-developed and well-nourished.  HENT:  Head: Normocephalic and atraumatic.  Eyes: Conjunctivae are normal. Right eye exhibits no discharge.  Neck: Neck supple.  Cardiovascular: Normal rate, regular rhythm and normal heart sounds.   No murmur heard. Pulmonary/Chest: Effort normal and breath sounds normal. She has no wheezes. She has no rales.  Abdominal: Soft. She exhibits no distension. There is no tenderness.  Musculoskeletal: Normal range of motion. She exhibits no edema.  Neurological: She is oriented to person, place, and time. No cranial nerve deficit.  Normal neurologic exam.  Skin: Skin is warm and dry. No rash noted. She is not diaphoretic.  Psychiatric: She has a normal mood and affect. Her behavior is normal.  Nursing note and vitals reviewed.   ED Course  Procedures (including critical care time) Labs Review Labs Reviewed  BASIC METABOLIC PANEL  CBC WITH DIFFERENTIAL/PLATELET    Imaging Review No results found. I have personally reviewed and evaluated these images and lab results as part of my medical decision-making.   EKG Interpretation None      MDM   Final diagnoses:  None    Patient is a 54 year old female presenting today with migraine. Patient has history of migraines. She states states it feels like her normal migraines. We will put an IV give her fluids as well as Compazine and Benadryl. Anticipate ability to discharge home. No red flags, no eed for CAT scan at this time.    Avneet Ashmore Julio Alm, MD 01/03/15 2101

## 2015-01-03 NOTE — ED Notes (Signed)
Pt escorted to and from bathroom with this RN

## 2015-01-03 NOTE — Discharge Instructions (Signed)

## 2015-01-03 NOTE — ED Notes (Signed)
Pt walked to bathroom with mod assistance. Pt tolerated well

## 2015-01-03 NOTE — ED Notes (Addendum)
Pt st's she has had headaches off and on since 8/17.  Also c/o pain in right shoulder, jaw and ear. Has been taking OTC meds without relief.  Pt sent here from Urgent Care

## 2015-04-30 ENCOUNTER — Ambulatory Visit (INDEPENDENT_AMBULATORY_CARE_PROVIDER_SITE_OTHER): Payer: BC Managed Care – PPO | Admitting: Podiatry

## 2015-04-30 ENCOUNTER — Encounter: Payer: Self-pay | Admitting: Podiatry

## 2015-04-30 ENCOUNTER — Ambulatory Visit (INDEPENDENT_AMBULATORY_CARE_PROVIDER_SITE_OTHER): Payer: BC Managed Care – PPO

## 2015-04-30 DIAGNOSIS — M779 Enthesopathy, unspecified: Secondary | ICD-10-CM | POA: Diagnosis not present

## 2015-05-01 NOTE — Progress Notes (Signed)
She presents today with chief complaint of pain to the first metatarsophalangeal joints bilaterally she states the pain is similar to that she had prior to her surgical correction of her bunions. She states that seems to be worse with particular types of shoe gears and different times of the day. She states that extends from the hallux interphalangeal joint as she points to the area proximally to the fifth metatarsophalangeal joint dorsal and lateral. She states this a dull achy pain no sharp shooting pains. She denies any trauma. She does result relate having purchased several new shoes recently. She denies that shoes have anything to do with the soreness of the joints.  Objective: Vital signs are stable she is alert and oriented 3. Pulses are palpable neurologic sensorium is intact deep tendon reflexes are intact muscle strength is normal bilateral. Orthopedic evaluation of his result was distal ankle range of motion without crepitus that she does have tenderness on palpation to the dorsal lateral aspect of the first metatarsophalangeal joint bilaterally right greater than left. Radiographic evaluation demonstrates retention with single screw status post Akin osteotomy proximal phalanx hallux bilateral. No complications with this. She also demonstrates some early joint space narrowing first metatarsophalangeal joint of the left foot. Right foot appears to be perfectly normal anatomically. No spurs are noted around the metatarsophalangeal joints bilaterally. I think she does have a functional hallux limitus particularly on the left side. Cutaneous evaluation does not demonstrate any type of abnormality. No open lesions or wounds.  Assessment: Capsulitis first metatarsophalangeal joint bilateral.  Plan: Injected today dexamethasone and local anesthetic after sterile Betadine skin prep to the dorsal lateral aspect of the metatarsophalangeal joints bilateral. Follow up with her as needed.

## 2015-05-02 ENCOUNTER — Ambulatory Visit: Payer: BC Managed Care – PPO | Admitting: Podiatry

## 2015-06-13 ENCOUNTER — Encounter (HOSPITAL_COMMUNITY): Payer: Self-pay | Admitting: Emergency Medicine

## 2015-06-13 ENCOUNTER — Emergency Department (HOSPITAL_COMMUNITY)
Admission: EM | Admit: 2015-06-13 | Discharge: 2015-06-13 | Disposition: A | Discharge status: Home / Self Care | Payer: BC Managed Care – PPO | Source: Home / Self Care | Attending: Family Medicine | Admitting: Family Medicine

## 2015-06-13 DIAGNOSIS — J111 Influenza due to unidentified influenza virus with other respiratory manifestations: Secondary | ICD-10-CM

## 2015-06-13 LAB — POCT URINALYSIS DIP (DEVICE)
Bilirubin Urine: NEGATIVE
Glucose, UA: NEGATIVE mg/dL
Ketones, ur: NEGATIVE mg/dL
Leukocytes, UA: NEGATIVE
Nitrite: NEGATIVE
Protein, ur: NEGATIVE mg/dL
Specific Gravity, Urine: <=1.005 (ref 1.005–1.030)
Urobilinogen, UA: 0.2 mg/dL (ref 0.0–1.0)
pH: 6 (ref 5.0–8.0)

## 2015-06-13 MED ORDER — OSELTAMIVIR PHOSPHATE 75 MG PO CAPS: 75.0000 mg | ORAL_CAPSULE | Freq: Two times a day (BID) | ORAL | Status: DC

## 2015-06-13 NOTE — ED Notes (Signed)
Sore throat, ear pain, generalized body aches.  Reports fever 101.3, complains of chills and headache.  .  Symptoms started 2/8

## 2015-06-13 NOTE — ED Provider Notes (Signed)
CSN: [id number]     Arrival date & time 06/13/15  1833 History   First MD Initiated Contact with Patient 06/13/15 2046     Chief Complaint  Patient presents with  . Sore Throat  . URI   (Consider location/radiation/quality/duration/timing/severity/associated sxs/prior Treatment) HPI Patient presents with body aches, fever, nausea, sore throat, earache since yesterday. 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 She states that her pain score is approximately 4. She has been taking Aleve without any great resolution of her symptoms. Past Medical History  Diagnosis Date  . Migraine   . Narcolepsy     was on meds yrs ago;will take Melatonin if needed  . Seasonal allergies     takes Claritin daily  . Muscle spasm     takes Flexeril daily as needed  . Chronic back pain     DDD/scoliosis  . PONV (postoperative nausea and vomiting)   . Hyperlipidemia     was on meds but has been off for yrs-told much improved  . History of migraine     last one a week ago  . Weakness     numbness and tingling down right left  . GERD (gastroesophageal reflux disease)     occasionally but no meds  . Constipation     takes OTC stool softener,laxatives,or enema  . Anemia     hx of-was on iron pills yrs ago  . UTI (lower urinary tract infection)     treated in May with antibiotic  . Depression     not on any meds  . History of panic attacks     doesn't take meds   Past Surgical History  Procedure Laterality Date  . Abdominal hysterectomy    . Bilateral foot surgery    . Myomectomy      to remove fibroids  . Knot removed from left side of neck    . Eye surgery Left   . Colonoscopy     No family history on file. Social History  Substance Use Topics  . Smoking status: Never Smoker   . Smokeless tobacco: None  . Alcohol Use: 0.0 oz/week    0 Standard drinks or equivalent per week     Comment: occasionally   OB History    No  data available     Review of Systems ROS +'ve sore throat, body aches  Denies: HEADACHE, NAUSEA, ABDOMINAL PAIN, CHEST PAIN, CONGESTION, DYSURIA, SHORTNESS OF BREATH  Allergies  Review of patient's allergies indicates no known allergies.  Home Medications   Prior to Admission medications   Medication Sig Start Date End Date Taking? Authorizing Provider  cyclobenzaprine (FLEXERIL) 10 MG tablet Take 1 tablet (10 mg total) by mouth 2 (two) times daily as needed for muscle spasms. 01/03/15   Courteney Lyn Mackuen, MD  diclofenac (VOLTAREN) 75 MG EC tablet Take 75 mg by mouth 2 (two) times daily.    Historical Provider, MD  escitalopram (LEXAPRO) 5 MG tablet Take 5 mg by mouth daily. 12/25/14   Historical Provider, MD  estradiol (ESTRACE) 1 MG tablet Take 1 mg by mouth daily.    Historical Provider, MD  Garcinia Cambogia-Chromium 500-200 MG-MCG TABS Take 2 tablets by mouth daily.    Historical Provider, MD  loratadine (CLARITIN) 10 MG tablet Take 10 mg by mouth daily as needed for allergies.     Historical Provider, MD  oseltamivir (TAMIFLU) 75 MG capsule Take 1 capsule (  75 mg total) by mouth every 12 (twelve) hours. 06/13/15   Konrad Felix, PA   Meds Ordered and Administered this Visit  Medications - No data to display  BP 113/65 mmHg  Pulse 107  Temp(Src) 99.4 F (37.4 C) (Oral)  Resp 16  SpO2 99% No data found.   Physical Exam  Constitutional: She appears well-developed and well-nourished.  HENT:  Head: Normocephalic and atraumatic.  Right Ear: External ear normal.  Left Ear: External ear normal.  Nose: Nose normal.  Mouth/Throat: Oropharynx is clear and moist.  Eyes: Conjunctivae are normal.  Neck: Normal range of motion. Neck supple.  Pulmonary/Chest: Effort normal and breath sounds normal.  Abdominal: Soft.  Neurological: She is alert.  Skin: Skin is warm and dry.  Psychiatric: She has a normal mood and affect. Her behavior is normal.  Nursing note and vitals  reviewed.   ED Course  Procedures (including critical care time)  Labs Review Labs Reviewed  POCT URINALYSIS DIP (DEVICE) - Abnormal; Notable for the following:    Hgb urine dipstick TRACE (*)    All other components within normal limits    Imaging Review No results found.   Visual Acuity Review  Right Eye Distance:   Left Eye Distance:   Bilateral Distance:    Right Eye Near:   Left Eye Near:    Bilateral Near:         MDM   1. Influenza    Patient is advised to continue home symptomatic treatment. Prescription for Tamiflu sent pharmacy patient has indicated. Patient is advised that if there are new or worsening symptoms or attend the emergency department, or contact primary care provider. Instructions of care provided discharged home in stable condition. Return to work/school note provided.  THIS NOTE WAS GENERATED USING A VOICE RECOGNITION SOFTWARE PROGRAM. ALL REASONABLE EFFORTS  WERE MADE TO PROOFREAD THIS DOCUMENT FOR ACCURACY.     Konrad Felix, PA 06/13/15 2109

## 2015-06-13 NOTE — Discharge Instructions (Signed)

## 2015-09-14 ENCOUNTER — Encounter (HOSPITAL_COMMUNITY): Payer: Self-pay | Admitting: Emergency Medicine

## 2015-09-14 ENCOUNTER — Emergency Department (HOSPITAL_COMMUNITY): Payer: BC Managed Care – PPO

## 2015-09-14 ENCOUNTER — Emergency Department (HOSPITAL_COMMUNITY)
Admission: EM | Admit: 2015-09-14 | Discharge: 2015-09-14 | Disposition: A | Discharge status: Home / Self Care | Payer: BC Managed Care – PPO | Attending: Emergency Medicine | Admitting: Emergency Medicine

## 2015-09-14 DIAGNOSIS — K5900 Constipation, unspecified: Secondary | ICD-10-CM | POA: Diagnosis not present

## 2015-09-14 DIAGNOSIS — Y9389 Activity, other specified: Secondary | ICD-10-CM | POA: Insufficient documentation

## 2015-09-14 DIAGNOSIS — F329 Major depressive disorder, single episode, unspecified: Secondary | ICD-10-CM | POA: Insufficient documentation

## 2015-09-14 DIAGNOSIS — S01511A Laceration without foreign body of lip, initial encounter: Secondary | ICD-10-CM

## 2015-09-14 DIAGNOSIS — Y998 Other external cause status: Secondary | ICD-10-CM | POA: Diagnosis not present

## 2015-09-14 DIAGNOSIS — Z79899 Other long term (current) drug therapy: Secondary | ICD-10-CM | POA: Diagnosis not present

## 2015-09-14 DIAGNOSIS — Z791 Long term (current) use of non-steroidal anti-inflammatories (NSAID): Secondary | ICD-10-CM | POA: Insufficient documentation

## 2015-09-14 DIAGNOSIS — G8929 Other chronic pain: Secondary | ICD-10-CM | POA: Diagnosis not present

## 2015-09-14 DIAGNOSIS — S0993XA Unspecified injury of face, initial encounter: Secondary | ICD-10-CM | POA: Diagnosis present

## 2015-09-14 DIAGNOSIS — Z862 Personal history of diseases of the blood and blood-forming organs and certain disorders involving the immune mechanism: Secondary | ICD-10-CM | POA: Insufficient documentation

## 2015-09-14 DIAGNOSIS — R51 Headache: Secondary | ICD-10-CM

## 2015-09-14 DIAGNOSIS — Y9289 Other specified places as the place of occurrence of the external cause: Secondary | ICD-10-CM | POA: Insufficient documentation

## 2015-09-14 DIAGNOSIS — R519 Headache, unspecified: Secondary | ICD-10-CM

## 2015-09-14 DIAGNOSIS — Z23 Encounter for immunization: Secondary | ICD-10-CM | POA: Insufficient documentation

## 2015-09-14 DIAGNOSIS — Z8679 Personal history of other diseases of the circulatory system: Secondary | ICD-10-CM | POA: Insufficient documentation

## 2015-09-14 DIAGNOSIS — Z87440 Personal history of urinary (tract) infections: Secondary | ICD-10-CM | POA: Insufficient documentation

## 2015-09-14 MED ORDER — IBUPROFEN 400 MG PO TABS
400.0000 mg | ORAL_TABLET | Freq: Once | ORAL | Status: AC
  Administered 2015-09-14: 400 mg via ORAL
  Filled 2015-09-14: qty 1

## 2015-09-14 MED ORDER — TETANUS-DIPHTH-ACELL PERTUSSIS 5-2.5-18.5 LF-MCG/0.5 IM SUSP
0.5000 mL | Freq: Once | INTRAMUSCULAR | Status: AC
  Administered 2015-09-14: 0.5 mL via INTRAMUSCULAR
  Filled 2015-09-14: qty 0.5

## 2015-09-14 MED ORDER — ACETAMINOPHEN 500 MG PO TABS
1000.0000 mg | ORAL_TABLET | Freq: Once | ORAL | Status: AC
  Administered 2015-09-14: 1000 mg via ORAL
  Filled 2015-09-14: qty 2

## 2015-09-14 MED ORDER — LIDOCAINE HCL (PF) 1 % IJ SOLN
5.0000 mL | Freq: Once | INTRAMUSCULAR | Status: AC
  Administered 2015-09-14: 5 mL
  Filled 2015-09-14: qty 5
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
## 2015-09-14 NOTE — ED Notes (Signed)
PA Mohr at bedside to suture patient's lip.

## 2015-09-14 NOTE — Discharge Instructions (Signed)
Please read and follow all provided instructions.  Your diagnoses today include:  1. Lip laceration, initial encounter   2. Facial pain    Tests performed today include:  X-ray of the affected area that did not show any foreign bodies or broken bones  Vital signs. See below for your results today.   Medications prescribed:   Take any prescribed medications only as directed.   Home care instructions:  Follow any educational materials and wound care instructions contained in this packet.   You may shower and wash the area with soap and water, just be sure to pat the area dry and not rub over the stitches. Do no put your stiches underwater (in a bath, pool, or lake). Getting stiches wet can slow down healing and increase your chances of getting an infection. You may apply Bacitracin or Neosporin twice a day for 7 days, and keep the ara clean with  bandage or gauze. Do not apply alcohol or hydrogen peroxide. Cover the area if it draining or weeping.   Follow-up instructions: Please follow up with your Primary Care Provider for check on lip laceration. The sutures do not need to be taken out as they will dissolve. There were three placed. They should dissolve within 10 days.   Return instructions:  Return to the Emergency Department if you have:  Fever  Worsening pain  Worsening swelling of the wound  Pus draining from the wound  Redness of the skin that moves away from the wound, especially if it streaks away from the affected area   Any other emergent concerns  Your vital signs today were: BP 127/79 mmHg   Pulse 87   Temp(Src) 98.1 F (36.7 C) (Oral)   Resp 18   SpO2 100% If your blood pressure (BP) was elevated above 135/85 this visit, please have this repeated by your doctor within one month. --------------
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
## 2015-09-14 NOTE — ED Notes (Signed)
Social work at bedside.  

## 2015-11-21 ENCOUNTER — Other Ambulatory Visit: Payer: Self-pay | Admitting: Family Medicine

## 2015-11-21 DIAGNOSIS — Z1231 Encounter for screening mammogram for malignant neoplasm of breast: Secondary | ICD-10-CM

## 2015-12-20 ENCOUNTER — Ambulatory Visit
Admission: RE | Admit: 2015-12-20 | Discharge: 2015-12-20 | Disposition: A | Discharge status: Home / Self Care | Payer: BC Managed Care – PPO | Source: Ambulatory Visit | Attending: Family Medicine | Admitting: Family Medicine

## 2015-12-20 DIAGNOSIS — Z1231 Encounter for screening mammogram for malignant neoplasm of breast: Secondary | ICD-10-CM

## 2016-02-26 ENCOUNTER — Encounter: Payer: Self-pay | Admitting: Neurology

## 2016-02-26 ENCOUNTER — Ambulatory Visit (INDEPENDENT_AMBULATORY_CARE_PROVIDER_SITE_OTHER): Payer: BC Managed Care – PPO | Admitting: Neurology

## 2016-02-26 ENCOUNTER — Telehealth: Payer: Self-pay

## 2016-02-26 VITALS — BP 118/80 | HR 80 | Resp 20 | Ht 65.0 in | Wt 167.0 lb

## 2016-02-26 DIAGNOSIS — G47419 Narcolepsy without cataplexy: Secondary | ICD-10-CM

## 2016-02-26 MED ORDER — MODAFINIL 100 MG PO TABS: ORAL_TABLET | ORAL | 5 refills | Status: DC

## 2016-02-26 NOTE — Patient Instructions (Signed)
I believe you have a condition called narcolepsy: This means, that you have a sleep disorder that manifests with at times severe excessive sleepiness during the day and often with problems with sleep at night. We may have to try different medications that may help you stay awake during the day. Not everything works with everybody the same way. Wake promoting agents include stimulants and non-stimulant type medications. The most common side effects with stimulants are weight loss, insomnia, nervousness, headaches, palpitations, rise in blood pressure, anxiety. Stimulants can be addictive and subject to abuse. Non-stimulant type wake promoting medications include Provigil and Nuvigil, most common side effects include headaches, nervousness, insomnia, hypertension. In addition there is a medication called Xyrem which has been proven to be very effective in patients with narcolepsy with or without cataplexy. Some patients with narcolepsy report episodes of weakness, such as jaw or facial weakness, legs giving out, feeling wobbly or like "Jell-o", etc. in situations of anxiety, stress, laughter, sudden sadness, surprise, etc., which is called cataplexy. You can also experience episodes of sleep paralysis during which you may feel unable to move upon awakening. Some people experience dreamlike sequences upon awakening or upon drifting off to sleep, called hypnopompic or hypnagogic hallucinations.  

## 2016-02-26 NOTE — Progress Notes (Signed)
[facility]EP [facility]INE [facility]IC   Provider:  Larey Seat, M D  Referring Provider: Gaynelle Arabian, MD Primary Care Physician:  Simona Huh, MD  Chief Complaint  Patient presents with  . New Patient (Initial Visit)    sleep study in 2007, "came off treatment" no cpap    HPI:  Amber Riley is a 55 y.o. female , seen here as a referral  from Dr. Marisue Humble / [facility] [place], [place] for Narcolepsy treatment.  Amber Riley reports that she was diagnosed with narcolepsy on 11/09/2005 by Dr. Baird Lyons. Her referring physician at the time was Dr. Idolina Primer. She had endorsed the Epworth sleepiness score at 20 out of 24 points had a BMI of 26.5 and a normal polysomnography without evidence of apnea. Overall AHI was 3.9. She slept all night in supine position. REM AHI was 7.5. She had 21% of REM sleep of total sleep time. REM latency was 86 minutes. The study was followed by an M SLT. She was allowed 5 naps in one of those she could not go to sleep but the others had a sleep latency of less than than 1.5 minutes. REM sleep latency was 10 minutes and 1 and 4 minutes and another nap. 3 naps had no REM sleep onset. 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 She was on Xyrem to 3-1/2 month, but has not been on Xyrem for almost 7 years. She remembers that she was not fully titrated at the time that she had to refrain from this therapy.  Further medical history positive for multinodular goiter, depression, migraine headaches, respiratory allergies,  Chief complaint according to patient : Excessive daytime sleepiness  Sleep habits are as follows: She usually goes to bed between 11 and 11:30 and does not have  trouble to fall asleep, but cannot stay asleep. She often falls asleep on the sofa before going to the bedroom. Usually when watching TV. Her bedroom however is cool, quiet and dark and she is you have it usually going over to the bedroom by 1 AM.  Most nights she sleeps through, until 5 AM.  Sometimes a bed partner may snore and wake her, some nights she has a bathroom break but not every night. Her average sleep time at night is not sufficient at only 6 hours or less. She sleeps usually on her side but during the night will roll over and sleeps supine. She is usually using one or 2 pillows for neck support.Headaches are present many mornings, she feels neither refreshed nor restored.  She works from [employer] and tries to get a nap in at lunch, but these are neither refreshing or restorative. Once she arrives home after work she has to take a nap.  Sleep medical history and family sleep history:  Hypersomnia, father- had ESRD. Brother has insomnia.Niece on maternal site has narcolepsy.   Social history:  Works 8 to 5 PM, he does not drink caffeine containing sodas, ice tea nor coffee. She does drink alcohol about 2 a week, she does not use tobacco  Review of Systems: Out of a complete 14 system review, the patient complains of only the following symptoms, and all other reviewed systems are  negative. The patient endorsed excessive daytime sleepiness but not cataplexy. How likely are you to doze in the following situations: 0 = not likely, 1 = slight chance, 2 = moderate chance, 3 = high chance  Sitting and Reading?3 Watching Television?3 Sitting inactive in a public place (theater or meeting)?3 As a passenger in a car for an hour without a break?2  Lying down in the afternoon when circumstances permit?3 Sitting and talking to someone?1 Sitting quietly after lunch without alcohol?2 In a car, while stopped for a few minutes in traffic?1  Total = 18     Epworth score  , Fatigue severity  score 50  , depression scoren/a    Social History   Social History  . Marital status: Divorced    Spouse name: N/A  . Number of children: N/A  . Years of education: N/A   Occupational History  . Not on file.   Social History Main Topics  . Smoking status: Never Smoker  . Smokeless tobacco: Not on file  . Alcohol use 0.0 oz/week     Comment: occasionally  . Drug use: No  . Sexual activity: Yes    Birth control/ protection: Surgical   Other Topics Concern  . Not on file   Social History Narrative  . No narrative on file    No family history on file.  Past Medical History:  Diagnosis Date  . Anemia    hx of-was on iron pills yrs ago  . Chronic back pain    DDD/scoliosis  . Constipation    takes OTC stool softener,laxatives,or enema  . Depression    not on any meds  . GERD (gastroesophageal reflux disease)    occasionally but no meds  . History of migraine    last one a week ago  . History of panic attacks    doesn't take meds  . Hyperlipidemia    was on meds but has been off for yrs-told much improved  . Migraine   . Muscle spasm    takes Flexeril daily as needed  . 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 PONV (postoperative nausea and vomiting)   . Seasonal allergies    takes Claritin daily  . UTI (lower urinary tract infection)    treated in May with antibiotic  . Weakness    numbness and tingling down right left    Past Surgical History:  Procedure Laterality Date  . ABDOMINAL HYSTERECTOMY    . BILATERAL FOOT SURGERY    . COLONOSCOPY    . EYE SURGERY Left   . knot removed from left side of neck    . MYOMECTOMY     to remove fibroids    Current Outpatient Prescriptions  Medication Sig Dispense Refill  . estradiol (ESTRACE) 1 MG tablet Take 1 mg by mouth daily.    . fluticasone (FLONASE) 50 MCG/ACT nasal spray Place into both nostrils daily.    . naproxen sodium (ANAPROX) 220 MG tablet Take 220 mg by mouth 2 (two)  times daily with a meal.    . Probiotic Product (RESTORA PO) Take by mouth.    . Tetrahydrozoline HCl (EYE DROPS OP) Apply to eye. Systane     No current facility-administered medications for this visit.     Allergies as of 02/26/2016  . (No Known Allergies)    Vitals: BP 118/80   Pulse 80   Resp 20   Ht 5\' 5"  (1.651 m)  Wt 167 lb (75.8 kg)   BMI 27.79 kg/m  Last Weight:  Wt Readings from Last 1 Encounters:  02/26/16 167 lb (75.8 kg)   [id number] mass index is 27.79 kg/m.     Last Height:   Ht Readings from Last 1 Encounters:  02/26/16 5\' 5"  (1.651 m)    Physical exam:  General: The patient is awake, alert and appears not in acute distress. The patient is well groomed. Head: Normocephalic, atraumatic. Neck is supple. Mallampati3,  neck circumference: 14.5 . Nasal airflow patent ,  Retrognathia is mild- Goiter.  Cardiovascular:  Regular rate and rhythm, without  murmurs or carotid bruit, and without distended neck veins. Respiratory: Lungs are clear to auscultation. Skin:  Without evidence of edema, or rash Trunk: BMI is 28 . The patient's posture is erect  Neurologic exam : The patient is awake and alert, oriented to place and time.   Memory subjective described as intact.  Attention span & concentration ability appears normal.  Speech is fluent,  without dysarthria, dysphonia or aphasia.  Mood and affect are appropriate.  Cranial nerves: Pupils are equal and briskly reactive to light.  Extraocular movements  in vertical and horizontal planes intact and without nystagmus. Visual fields by finger perimetry are intact. Hearing to finger rub intact.   Facial sensation intact to fine touch.  Facial motor strength is symmetric and tongue and uvula move midline. Shoulder shrug was symmetrical.   Motor exam:   Normal tone, muscle bulk and symmetric strength in all extremities. Sensory:  Fine touch, pinprick and vibration were tested in all extremities. Proprioception  tested in the upper extremities was normal. Coordination: Rapid alternating movements in the fingers/hands was normal. Finger-to-nose maneuver  normal without evidence of ataxia, dysmetria or tremor. Gait and station: Patient walks without assistive device and is able unassisted to climb up to the exam table. Strength within normal limits. Stance is stable and normal.   Deep tendon reflexes: in the  upper and lower extremities are symmetric and intact. Babinski maneuver response is downgoing.  The patient was advised of the nature of the diagnosed sleep disorder , the treatment options and risks for general a health and wellness arising from not treating the condition.  I spent more than 35 minutes of face to face time with the patient. Greater than 50% of time was spent in counseling and coordination of care. We have discussed the diagnosis and differential and I answered the patient's questions.     Assessment:  After physical and neurologic examination, review of laboratory studies,  Personal review of imaging studies, reports of other /same  Imaging studies ,  Results of polysomnography/ neurophysiology testing and pre-existing records as far as provided in visit., my assessment is   1) the patient does not remember vivid dreams, she does usually sleep through the night does not have fragmented sleep. But is excessively daytime sleepy. Based on Dr. Janee Morn MS LT study she was diagnosed with narcolepsy but lacks symptoms of cataplexy. Has not had sleep paralysis in many years.  2) the patient is mildly obese, his gained about 25 pounds over the last year. No family members have noticed snoring, she is unlikely to have apnea.  3) based on the incomplete symptom complex , no cataplexy , clinical narcolepsy , I would not use it Xyrem on this patient but modafinil. Brand names on Nuvigil and Provigil.    Plan:  Treatment plan and additional workup :  Modafinil 200 mg daily in  AM. Strategic naps.  She reports it is hard for her to nap she cannot sit, has to lay down, and often the nap is  not refreshing.  No XYREM.  Rv in 6 month with NP.   Asencion Partridge Tanylah Schnoebelen MD  02/26/2016   CC: Gaynelle Arabian, Md [address] [place], [place] [address]

## 2016-02-26 NOTE — Telephone Encounter (Signed)
[facility] approved modafinil 02/26/2016 to 05/20/2016. Will inform [facility] on [place] of this decision.

## 2016-02-26 NOTE — Telephone Encounter (Signed)
I completed a pa for modafinil, sent to [facility]. Should have a determination in 1-3 business days.

## 2016-03-02 NOTE — Telephone Encounter (Signed)
Pt called in stating [facility] has not received the PA on modafinil. Pt requesting call back to give update on status. [phone]

## 2016-03-02 NOTE — Telephone Encounter (Signed)
I spoke to pt. I advised her that the pa for modafinil was approved last week. She says that the [facility] will not fill it for her because they need something from [place].  I called [facility], spoke to [employer]. I advised her that the pa was approved for modafinil. She said that there is something going on insurance related between [employer] and pt's insurance but they will call her insurance to get it straightened out. They will also have to order the modafinil but she thinks they could have it on Wednesday. I asked that she call the pt and explain this to her.

## 2016-03-05 NOTE — Telephone Encounter (Signed)
   Dear Amber Riley  No second dose !   no caffeine.  Tomorrow take 1/2 tab of modafinil with lots of water and see if you can tolerate the medication. Headaches are common in the first 7 days, mostly if poorly hydrated or drinking caffeine. CD

## 2016-03-05 NOTE — Telephone Encounter (Signed)
Pt called to advise she took 1st dose of the medication this morning and now she has a slight irritating HA with pain on the rt side of the eyebrow and feels like it could down the back of the neck. She is wanting to know if this medication could cause a migraine. She also said she feels like her heart may start racing like drinking a cup of caffeinated coffee. Pt states she is going to take 2 aleve to see if helps relieve the HA. Please call

## 2016-03-05 NOTE — Telephone Encounter (Signed)
Spoke with patient and advised her headache is a possible side effect of Modafinil, and uneven heartbeats. She stated she just took Aleve 2 tabs for her headache. She stated her heart "feels like it does when I drink 1-2 cups of coffee. Like it's going to start beating really fast." She stated she is due to take second dose with lunch. This RN advised she not take Modafinil until this RN can discuss with Dr Brett Fairy and call her back. She verbalized understanding, agreement.

## 2016-03-05 NOTE — Telephone Encounter (Signed)
Per Dr Brett Fairy , spoke with patient and advised she not take the second dose of modafinil as this RN had instructed her earlier today. She stated she did not take it. Advised her Dr Brett Fairy wants her to take 1/2 tablet in the morning and drink plenty of water, 1 1/2 - 2 glasses. Advised her no caffeine tomorrow. Informed her the medication can cause headaches for the first 7 days, particularly if she is not well hydrated or is drinking caffeine.  She then stated she has had history of right chest pain, has not had episode in a long timeb ut had one this morning. She stated she has history of panic attacks, wasn't sure if the pain was due to anxiety. She stated she has stress in her life, sometimes is anxious. She has not discussed this with her PCP. Advised if she has chest pain that increases in frequency, duration, pain she should call PCP and discuss or go to urgent care or ED.  Advised she monitor this tomorrow when she takes modafinil, call for any questions, problems. She verbalized understanding, appreciation.

## 2016-03-08 ENCOUNTER — Emergency Department (HOSPITAL_COMMUNITY): Payer: BC Managed Care – PPO

## 2016-03-08 ENCOUNTER — Emergency Department (HOSPITAL_COMMUNITY)
Admission: EM | Admit: 2016-03-08 | Discharge: 2016-03-08 | Disposition: A | Discharge status: Home / Self Care | Payer: BC Managed Care – PPO | Attending: Emergency Medicine | Admitting: Emergency Medicine

## 2016-03-08 ENCOUNTER — Encounter (HOSPITAL_COMMUNITY): Payer: Self-pay | Admitting: Emergency Medicine

## 2016-03-08 DIAGNOSIS — Z7982 Long term (current) use of aspirin: Secondary | ICD-10-CM | POA: Insufficient documentation

## 2016-03-08 DIAGNOSIS — R079 Chest pain, unspecified: Secondary | ICD-10-CM

## 2016-03-08 DIAGNOSIS — E041 Nontoxic single thyroid nodule: Secondary | ICD-10-CM

## 2016-03-08 LAB — I-STAT TROPONIN, ED: Troponin i, poc: 0 ng/mL (ref 0.00–0.08)

## 2016-03-08 LAB — BASIC METABOLIC PANEL
Anion gap: 7 (ref 5–15)
BUN: 10 mg/dL (ref 6–20)
CO2: 24 mmol/L (ref 22–32)
Calcium: 10 mg/dL (ref 8.9–10.3)
Chloride: 109 mmol/L (ref 101–111)
Creatinine, Ser: 0.92 mg/dL (ref 0.44–1.00)
GFR calc Af Amer: >60 mL/min (ref >60)
GFR calc non Af Amer: >60 mL/min (ref >60)
Glucose, Bld: 102 mg/dL — ABNORMAL HIGH (ref 65–99)
Potassium: 4.8 mmol/L (ref 3.5–5.1)
Sodium: 140 mmol/L (ref 135–145)

## 2016-03-08 LAB — CBC
HCT: 43.8 % (ref 36.0–46.0)
Hemoglobin: 14.2 g/dL (ref 12.0–15.0)
MCH: 28.3 pg (ref 26.0–34.0)
MCHC: 32.4 g/dL (ref 30.0–36.0)
MCV: 87.4 fL (ref 78.0–100.0)
Platelets: 233 10*3/uL (ref 150–400)
RBC: 5.01 MIL/uL (ref 3.87–5.11)
RDW: 13.5 % (ref 11.5–15.5)
WBC: 4.7 10*3/uL (ref 4.0–10.5)

## 2016-03-08 LAB — D-DIMER, QUANTITATIVE (NOT AT ARMC): D-Dimer, Quant: 0.52 ug/mL-FEU — ABNORMAL HIGH (ref 0.00–0.50)

## 2016-03-08 MED ORDER — ASPIRIN 81 MG PO CHEW
324.0000 mg | CHEWABLE_TABLET | Freq: Once | ORAL | Status: AC
  Administered 2016-03-08: 324 mg via ORAL
  Filled 2016-03-08: qty 4

## 2016-03-08 MED ORDER — IOPAMIDOL (ISOVUE-370) INJECTION 76%
INTRAVENOUS | Status: AC
Start: 2016-03-08 — End: 2016-03-08
  Administered 2016-03-08: 100 mL
  Filled 2016-03-08: qty 100

## 2016-03-08 MED ORDER — MORPHINE SULFATE (PF) 4 MG/ML IV SOLN
4.0000 mg | Freq: Once | INTRAVENOUS | Status: AC
  Administered 2016-03-08: 4 mg via INTRAVENOUS
  Filled 2016-03-08: qty 1

## 2016-03-08 MED ORDER — NAPROXEN 500 MG PO TABS: 500.0000 mg | ORAL_TABLET | Freq: Two times a day (BID) | ORAL | 0 refills | Status: DC

## 2016-03-08 MED ORDER — OMEPRAZOLE 20 MG PO CPDR: 20.0000 mg | DELAYED_RELEASE_CAPSULE | Freq: Every day | ORAL | 0 refills | Status: AC

## 2016-03-08 NOTE — ED Notes (Signed)
Patient returned from CT

## 2016-03-08 NOTE — ED Triage Notes (Signed)
Pt. Stated, I've had chest pain since Friday. It seems like its coming out of my rt. Arm and my neck.Its been off and on since Friday.

## 2016-03-08 NOTE — ED Provider Notes (Signed)
[facility] DEPT Provider Note   CSN: [id number] Arrival date & time: 03/08/16  1108     History   Chief Complaint Chief Complaint  Patient presents with  . Chest Pain    HPI Amber Riley is a 55 y.o. female.  The history is provided by the patient.  Chest Pain   This is a new problem. The current episode started 2 days ago. The problem occurs constantly (eased off a bit but then worse again this am). The problem has been gradually worsening. The pain is present in the lateral region (right). Quality: just painful, more aching than sharp. The pain radiates to the right arm (radiates to the arm and neck, ). Exacerbated by: not worse with exertion or movement. Associated symptoms include cough. Pertinent negatives include no fever, no shortness of breath and no vomiting.  Pertinent negatives for past medical history include no CAD, no DVT and no PE.  Pertinent negatives for family medical history include: no CAD and no PE.    Past Medical History:  Diagnosis Date  . Anemia    hx of-was on iron pills yrs ago  . Chronic back pain    DDD/scoliosis  . Constipation    takes OTC stool softener,laxatives,or enema  . Depression    not on any meds  . GERD (gastroesophageal reflux disease)    occasionally but no meds  . History of migraine    last one a week ago  . History of panic attacks    doesn't take meds  . Hyperlipidemia    was on meds but has been off for yrs-told much improved  . Migraine   . Muscle spasm    takes Flexeril daily as needed  . 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 PONV (postoperative nausea and vomiting)   . Seasonal allergies    takes Claritin daily  . UTI (lower urinary tract infection)    treated in May with antibiotic  . Weakness    numbness and tingling down right left    Patient Active Problem List   Diagnosis Date Noted  . Lumbago with sciatica 07/11/2014  . Right lumbar radiculitis 08/09/2013  .  Radiculitis of right cervical region 08/09/2013  . Right shoulder pain 08/09/2013  . URI 07/20/2007  . HOARSENESS 05/25/2007  . STRESS INCONTINENCE 05/04/2007  . NONTOXIC MULTINODULAR GOITER 04/14/2007  . MYALGIA 04/14/2007  . HEADACHE 03/02/2007  . ALLERGIC RHINITIS 06/14/2006  . DYSMENORRHEA, SEVERE 06/14/2006  . LEIOMYOMA, UTERUS 02/26/2006  . MIGRAINE HEADACHE 02/26/2006  . NARCOLEPSY W/O CATAPLEXY 02/26/2006  . 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 CONSTIPATION NOS 02/26/2006  . FX, ORBITAL FLOOR, OPEN 02/26/2006  . DEPRESSION, HX OF 02/26/2006    Past Surgical History:  Procedure Laterality Date  . ABDOMINAL HYSTERECTOMY    . BILATERAL FOOT SURGERY    . COLONOSCOPY    . EYE SURGERY Left   . knot removed from left side of neck    . MYOMECTOMY     to remove fibroids    OB History    No data available       Home Medications    Prior to Admission medications   Medication Sig Start Date End Date Taking? Authorizing Provider  estradiol (ESTRACE) 1 MG tablet Take 1 mg by mouth daily.    Historical Provider, MD  fluticasone (FLONASE) 50 MCG/ACT nasal spray Place into both nostrils daily.    Historical Provider, MD  modafinil (PROVIGIL) 100 MG tablet Take one tab in AM and one after Lunch Po. 02/26/16   Asencion Partridge Dohmeier, MD  naproxen (NAPROSYN) 500 MG tablet Take 1 tablet (500 mg total) by mouth 2 (two) times daily. 03/08/16   Dorie Rank, MD  naproxen sodium (ANAPROX) 220 MG tablet Take 220 mg by mouth 2 (two) times daily with a meal.    Historical Provider, MD  omeprazole (PRILOSEC) 20 MG capsule Take 1 capsule (20 mg total) by mouth daily. 03/08/16   Dorie Rank, MD  Probiotic Product (RESTORA PO) Take by mouth.    Historical Provider, MD  Tetrahydrozoline HCl (EYE DROPS OP) Apply to eye. Systane    Historical Provider, MD    Family History No family history on file.  Social History Social History  Substance Use Topics  . Smoking status: Never Smoker  . Smokeless tobacco:  Never Used  . Alcohol use 0.0 oz/week     Comment: occasionally     Allergies   Patient has no known allergies.   Review of Systems Review of Systems  Constitutional: Negative for fever.  Respiratory: Positive for cough. Negative for shortness of breath.   Cardiovascular: Positive for chest pain.  Gastrointestinal: Negative for vomiting.  All other systems reviewed and are negative.    Physical Exam Updated Vital Signs BP 132/91   Pulse 74   Temp 97.8 F (36.6 C) (Oral)   Resp 17   SpO2 100%   Physical Exam  Constitutional: She appears well-developed and well-nourished. No distress.  HENT:  Head: Normocephalic and atraumatic.  Right Ear: External ear normal.  Left Ear: External ear normal.  Eyes: Conjunctivae are normal. Right eye exhibits no discharge. Left eye exhibits no discharge. No scleral icterus.  Neck: Neck supple. No tracheal deviation present.  Cardiovascular: Normal rate, regular rhythm and intact distal pulses.   Pulmonary/Chest: Effort normal and breath sounds normal. No stridor. No respiratory distress. She has no wheezes. She has no rales.  Abdominal: Soft. Bowel sounds are normal. She exhibits no distension. There is no tenderness. There is no rebound and no guarding.  Musculoskeletal: She exhibits no edema or tenderness.  Neurological: She is alert. She has normal strength. No cranial nerve deficit (no facial droop, extraocular movements intact, no slurred speech) or sensory deficit. She exhibits normal muscle tone. She displays no seizure activity. Coordination normal.  Skin: Skin is warm and dry. No rash noted.  Psychiatric: She has a normal mood and affect.  Nursing note and vitals reviewed.    ED Treatments / Results  Labs (all labs ordered are listed, but only abnormal results are displayed) Labs Reviewed  BASIC METABOLIC PANEL - Abnormal; Notable for the following:       Result Value   Glucose, Bld 102 (*)    All other components within  normal limits  D-DIMER, QUANTITATIVE (NOT AT [facility]) - Abnormal; Notable for the following:    D-Dimer, Quant 0.52 (*)    All other components within normal limits  CBC  I-STAT TROPOININ, ED    EKG  EKG Interpretation  Date/Time:  Sunday March 08 2016 11:16:16 EST Ventricular Rate:  75 PR Interval:  176 QRS Duration: 82 QT Interval:  396 QTC Calculation: 442 R Axis:   88 Text Interpretation:  Normal sinus rhythm Normal ECG nonspecific t wave changes since prior ECG Confirmed by Maya Scholer  MD-J, Sanoe Hazan [id number]) on 03/08/2016 11:39:38 AM       Radiology Dg Chest 2 View  Result  Date: 03/08/2016 CLINICAL DATA:  Pt reports right sided tight and pressure chest pains radiating down right arm and up her neck x 2 days with hoarseness in throat. EXAM: CHEST  2 VIEW COMPARISON:  07/29/2010 FINDINGS: Mild convex right thoracolumbar spine curvature. Midline trachea. Normal heart size and mediastinal contours. No pleural effusion or pneumothorax. Mild hypoventilation with volume loss at the lung bases. IMPRESSION: No acute cardiopulmonary disease. Electronically Signed   By: Abigail Miyamoto M.D.   On: 03/08/2016 12:20   Ct Angio Chest Pe W And/or Wo Contrast  Result Date: 03/08/2016 CLINICAL DATA:  Right chest pain. EXAM: CT ANGIOGRAPHY CHEST WITH CONTRAST TECHNIQUE: Multidetector CT imaging of the chest was performed using the standard protocol during bolus administration of intravenous contrast. Multiplanar CT image reconstructions and MIPs were obtained to evaluate the vascular anatomy. CONTRAST:  100 cc Isovue 370 IV. COMPARISON:  Chest radiograph from earlier today. FINDINGS: Cardiovascular: The study is high quality for the evaluation of pulmonary embolism. There are no filling defects in the central, lobar, segmental or subsegmental pulmonary artery branches to suggest acute pulmonary embolism. Great vessels are normal in course and caliber. Normal heart size. No significant pericardial fluid/thickening.  Mediastinum/Nodes: Dominant 2.1 cm slightly hypodense left thyroid lobe nodule. Unremarkable esophagus. No pathologically enlarged axillary, mediastinal or hilar lymph nodes. Lungs/Pleura: No pneumothorax. No pleural effusion. Mild hypoventilatory changes in the dependent lower lobes. No acute consolidative airspace disease, lung masses or significant pulmonary nodules in the aerated portions of the lungs. Upper abdomen: Unremarkable. Musculoskeletal: No aggressive appearing focal osseous lesions. Mild thoracic spondylosis. Review of the MIP images confirms the above findings. IMPRESSION: 1. No pulmonary embolism.  No active pulmonary disease. 2. Dominant 2.1 cm left thyroid lobe nodule, for which correlation with thyroid ultrasound is recommended on a short term outpatient basis. This follows ACR consensus guidelines: Managing Incidental Thyroid Nodules Detected on Imaging: White Paper of the ACR Incidental Thyroid Findings Committee. J Am Coll Radiol 2015; 12:143-150. Electronically Signed   By: Ilona Sorrel M.D.   On: 03/08/2016 15:36    Procedures Procedures (including critical care time)  Medications Ordered in ED Medications  morphine 4 MG/ML injection 4 mg (4 mg Intravenous Given 03/08/16 1240)  aspirin chewable tablet 324 mg (324 mg Oral Given 03/08/16 1224)  iopamidol (ISOVUE-370) 76 % injection (100 mLs  Contrast Given 03/08/16 1501)     Initial Impression / Assessment and Plan / ED Course  I have reviewed the triage vital signs and the nursing notes.  Pertinent labs & imaging results that were available during my care of the patient were reviewed by me and considered in my medical decision making (see chart for details).  Clinical Course as of Mar 08 1602  Nancy Fetter Mar 08, 2016  1314 D dimer elevated.  Will ct  [JK]    Clinical Course User Index [JK] Dorie Rank, MD   Low risk for heart disease.  Heart score is 3.  Negative troponin despite a couple days of sx.  Doubt ACS.  CT without PE  or dissection.  Incidental thyroid nodule noted.  Will dc home with nsaids and PPI.  Follow up with PCP   Final Clinical Impressions(s) / ED Diagnoses   Final diagnoses:  Chest pain, unspecified type  Thyroid nodule    New Prescriptions New Prescriptions   NAPROXEN (NAPROSYN) 500 MG TABLET    Take 1 tablet (500 mg total) by mouth 2 (two) times daily.   OMEPRAZOLE (PRILOSEC) 20 MG  CAPSULE    Take 1 capsule (20 mg total) by mouth daily.     Dorie Rank, MD 03/09/16 2013

## 2016-03-08 NOTE — ED Notes (Signed)
Patient transported to CT 

## 2016-03-08 NOTE — Discharge Instructions (Signed)
Follow up with your doctor this week to be rechecked.  Return as needed for worsening symptoms.  An incidental thyroid nodule was noted on the CT scan.  Please follow up with your doctor to discuss routine follow up US.

## 2016-03-08 NOTE — ED Notes (Signed)
Patient transported to X-ray 

## 2016-04-21 ENCOUNTER — Telehealth: Payer: Self-pay | Admitting: Neurology

## 2016-04-21 MED ORDER — TRAZODONE HCL 50 MG PO TABS: 50.0000 mg | ORAL_TABLET | Freq: Every evening | ORAL | 1 refills | Status: DC | PRN

## 2016-04-21 NOTE — Telephone Encounter (Signed)
Patient reports provigil did not help with daytime alertness and she would like to try a sleep aid instead.  She received  the prescription on 02-24-2016 . I will ask her to try Trazodone.

## 2016-04-21 NOTE — Addendum Note (Signed)
Addended by: Larey Seat on: 04/21/2016 04:47 PM   Modules accepted: Orders

## 2016-04-21 NOTE — Telephone Encounter (Signed)
Pt called said she stopped taking modafinil (PROVIGIL) 100 MG tablet over the past 2 weeks. She said it did not help with sleepiness. She is wanting to know if there is something else she could try, maybe a sleep aid.

## 2016-04-22 NOTE — Telephone Encounter (Signed)
I spoke to patient and she is aware of information below. She has stopped the Provigil.

## 2016-06-01 ENCOUNTER — Ambulatory Visit (INDEPENDENT_AMBULATORY_CARE_PROVIDER_SITE_OTHER): Payer: Self-pay

## 2016-06-01 ENCOUNTER — Ambulatory Visit (INDEPENDENT_AMBULATORY_CARE_PROVIDER_SITE_OTHER): Payer: BC Managed Care – PPO | Admitting: Orthopaedic Surgery

## 2016-06-01 ENCOUNTER — Encounter (INDEPENDENT_AMBULATORY_CARE_PROVIDER_SITE_OTHER): Payer: Self-pay | Admitting: Orthopaedic Surgery

## 2016-06-01 DIAGNOSIS — M19041 Primary osteoarthritis, right hand: Secondary | ICD-10-CM

## 2016-06-01 DIAGNOSIS — H524 Presbyopia: Secondary | ICD-10-CM | POA: Insufficient documentation

## 2016-06-01 DIAGNOSIS — M25511 Pain in right shoulder: Secondary | ICD-10-CM | POA: Diagnosis not present

## 2016-06-01 DIAGNOSIS — G8929 Other chronic pain: Secondary | ICD-10-CM | POA: Diagnosis not present

## 2016-06-01 HISTORY — DX: Primary osteoarthritis, right hand: M19.041

## 2016-06-01 HISTORY — DX: Presbyopia: H52.4

## 2016-06-01 MED ORDER — MELOXICAM 7.5 MG PO TABS: 7.5000 mg | ORAL_TABLET | Freq: Two times a day (BID) | ORAL | 2 refills | Status: DC | PRN

## 2016-06-01 NOTE — Progress Notes (Signed)
Office Visit Note   Patient: Amber Riley           Date of Birth: 1961/03/24           MRN: [id number] Visit Date: 06/01/2016              Requested by: Gaynelle Arabian, MD [address] [place], [place] [address] PCP: Simona Huh, MD   Assessment & Plan: Visit Diagnoses:  1. Chronic right shoulder pain   2. Osteoarthritis of finger of right hand     Plan: Impression is right shoulder impingement possible cervical radiculitis. Subacromial injection given. Follow up in 2 weeks to see how she responds this. Thumb abduction orthosis was also given. Modic prescribed. Follow-up in 2 weeks.  Follow-Up Instructions: Return in about 2 weeks (around 06/15/2016).   Orders:  Orders Placed This Encounter  Procedures  . XR Shoulder Right  . XR Hand Complete Right   Meds ordered this encounter  Medications  . meloxicam (MOBIC) 7.5 MG tablet    Sig: Take 1 tablet (7.5 mg total) by mouth 2 (two) times daily as needed for pain.    Dispense:  30 tablet    Refill:  2      Procedures: Large Joint Inj Date/Time: 06/01/2016 11:50 AM Performed by: Leandrew Koyanagi Authorized by: Leandrew Koyanagi   Consent Given by:  Patient Timeout: prior to procedure the correct patient, procedure, and site was verified   Indications:  Pain Location:  Shoulder Site:  R subacromial bursa Prep: patient was prepped and draped in usual sterile fashion   Needle Size:  22 G Approach:  Posterior Ultrasound Guidance: No   Fluoroscopic Guidance: No       Clinical Data: No additional findings.   Subjective: Chief Complaint  Patient presents with  . Right Shoulder - Pain  . Right Ring Finger - Pain  . Right Thumb - Pain    Patient is a 56 year old female with right shoulder and right hand pain. She states her pain started in November of last year and is constant. She does have radiation into her axillary region and brought area. She also feels some swelling in her thumb. The pain  is worse with grabbing things with her hand. She denies any injections. Oral steroids have given him partial relief. She denies any injuries.    Review of Systems Complete review of systems negative except for history of present illness  Objective: Vital Signs: There were no vitals taken for this visit.  Physical Exam  Constitutional: She is oriented to person, place, and time. She appears well-developed and well-nourished.  HENT:  Head: Normocephalic and atraumatic.  Eyes: EOM are normal.  Neck: Neck supple.  Pulmonary/Chest: Effort normal.  Abdominal: Soft.  Neurological: She is alert and oriented to person, place, and time.  Skin: Skin is warm. Capillary refill takes less than 2 seconds.  Psychiatric: She has a normal mood and affect. Her behavior is normal. Judgment and thought content normal.  Nursing note and vitals reviewed.   Ortho Exam Exam of the right shoulder shows positive impingement signs. Negative rotator cuff pathology. Negative Spurling. Exam of the right hand shows negative grind test. No swelling lesions or rashes. Specialty Comments:  No specialty comments available.  Imaging: Xr Hand Complete Right  Result Date: 06/01/2016 Mild radial subluxation of thumb basal joint  Xr Shoulder Right  Result Date: 06/01/2016 No acute findings    PMFS History: Patient Active Problem List  Diagnosis Date Noted  . 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 Lumbago with sciatica 07/11/2014  . Right lumbar radiculitis 08/09/2013  . Radiculitis of right cervical region 08/09/2013  . Right shoulder pain 08/09/2013  . URI 07/20/2007  . HOARSENESS 05/25/2007  . STRESS INCONTINENCE 05/04/2007  . NONTOXIC MULTINODULAR GOITER 04/14/2007  . MYALGIA 04/14/2007  . HEADACHE 03/02/2007  . ALLERGIC RHINITIS 06/14/2006  . DYSMENORRHEA, SEVERE 06/14/2006  . LEIOMYOMA, UTERUS 02/26/2006  . MIGRAINE HEADACHE 02/26/2006  . NARCOLEPSY W/O CATAPLEXY 02/26/2006  .  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 CONSTIPATION NOS 02/26/2006  . FX, ORBITAL FLOOR, OPEN 02/26/2006  . DEPRESSION, HX OF 02/26/2006   Past Medical History:  Diagnosis Date  . Anemia    hx of-was on iron pills yrs ago  . Chronic back pain    DDD/scoliosis  . Constipation    takes OTC stool softener,laxatives,or enema  . Depression    not on any meds  . GERD (gastroesophageal reflux disease)    occasionally but no meds  . History of migraine    last one a week ago  . History of panic attacks    doesn't take meds  . Hyperlipidemia    was on meds but has been off for yrs-told much improved  . Migraine   . Muscle spasm    takes Flexeril daily as needed  . 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 PONV (postoperative nausea and vomiting)   . Seasonal allergies    takes Claritin daily  . UTI (lower urinary tract infection)    treated in May with antibiotic  . Weakness    numbness and tingling down right left    No family history on file.  Past Surgical History:  Procedure Laterality Date  . ABDOMINAL HYSTERECTOMY    . BILATERAL FOOT SURGERY    . COLONOSCOPY    . EYE SURGERY Left   . knot removed from left side of neck    . MYOMECTOMY     to remove fibroids   Social History   Occupational History  . Not on file.   Social History Main Topics  . Smoking status: Never Smoker  . Smokeless tobacco: Never Used  . Alcohol use 0.0 oz/week     Comment: occasionally  . Drug use: No  . Sexual activity: Yes    Birth control/ protection: Surgical

## 2016-06-15 ENCOUNTER — Ambulatory Visit (INDEPENDENT_AMBULATORY_CARE_PROVIDER_SITE_OTHER): Payer: BC Managed Care – PPO | Admitting: Orthopaedic Surgery

## 2016-06-15 DIAGNOSIS — M25511 Pain in right shoulder: Secondary | ICD-10-CM | POA: Diagnosis not present

## 2016-06-15 DIAGNOSIS — M19041 Primary osteoarthritis, right hand: Secondary | ICD-10-CM | POA: Diagnosis not present

## 2016-06-15 DIAGNOSIS — G8929 Other chronic pain: Secondary | ICD-10-CM

## 2016-06-15 DIAGNOSIS — M7541 Impingement syndrome of right shoulder: Secondary | ICD-10-CM

## 2016-06-15 MED ORDER — DICLOFENAC SODIUM 1 % TD GEL: 2.0000 g | Freq: Four times a day (QID) | TRANSDERMAL | 5 refills | Status: DC

## 2016-06-15 NOTE — Progress Notes (Signed)
Office Visit Note   Patient: Amber Riley           Date of Birth: 1960/07/16           MRN: [id number] Visit Date: 06/15/2016              Requested by: Gaynelle Arabian, MD [address] [place], [place] [address] PCP: Simona Huh, MD   Assessment & Plan: Visit Diagnoses:  1. Osteoarthritis of finger of right hand   2. Chronic right shoulder pain   3. Impingement syndrome of right shoulder     Plan: continue conservative treatment with right shoulder, HEP.  If not better in 4-6 weeks, should call back, consider MRI.  Finger is getting better.  voltaren gel.  Follow-Up Instructions: Return if symptoms worsen or fail to improve.   Orders:  No orders of the defined types were placed in this encounter.  Meds ordered this encounter  Medications  . diclofenac sodium (VOLTAREN) 1 % GEL    Sig: Apply 2 g topically 4 (four) times daily.    Dispense:  1 Tube    Refill:  5      Procedures: No procedures performed   Clinical Data: No additional findings.   Subjective: Chief Complaint  Patient presents with  . Right Shoulder - Pain, Follow-up  . Right Hand - Pain, Follow-up    56 yo here for follow up of right shoulder pain that's 85% better from injection and right 4th digit PIP joint pain.  Overall getting better.    Review of Systems  Constitutional: Negative.   HENT: Negative.   Eyes: Negative.   Respiratory: Negative.   Cardiovascular: Negative.   Endocrine: Negative.   Musculoskeletal: Negative.   Neurological: Negative.   Hematological: Negative.   Psychiatric/Behavioral: Negative.   All other systems reviewed and are negative.    Objective: Vital Signs: There were no vitals taken for this visit.  Physical Exam  Constitutional: She is oriented to person, place, and time. She appears well-developed and well-nourished.  Pulmonary/Chest: Effort normal.  Neurological: She is alert and oriented to person, place, and time.    Skin: Skin is warm. Capillary refill takes less than 2 seconds.  Psychiatric: She has a normal mood and affect. Her behavior is normal. Judgment and thought content normal.  Nursing note and vitals reviewed.   Ortho Exam Right shoulder exam is improved.  Less impingement.  Good RC function Right 4th digit exam - no warmth, mild swelling, good ROM and tendon function Specialty Comments:  No specialty comments available.  Imaging: No results found.   PMFS History: Patient Active Problem List   Diagnosis Date Noted  . 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 Lumbago with sciatica 07/11/2014  . Right lumbar radiculitis 08/09/2013  . Radiculitis of right cervical region 08/09/2013  . Chronic right shoulder pain 08/09/2013  . URI 07/20/2007  . HOARSENESS 05/25/2007  . STRESS INCONTINENCE 05/04/2007  . NONTOXIC MULTINODULAR GOITER 04/14/2007  . MYALGIA 04/14/2007  . HEADACHE 03/02/2007  . ALLERGIC RHINITIS 06/14/2006  . DYSMENORRHEA, SEVERE 06/14/2006  . LEIOMYOMA, UTERUS 02/26/2006  . MIGRAINE HEADACHE 02/26/2006  . NARCOLEPSY W/O CATAPLEXY 02/26/2006  . 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 CONSTIPATION NOS 02/26/2006  . FX, ORBITAL FLOOR, OPEN 02/26/2006  . DEPRESSION, HX OF 02/26/2006   Past Medical History:  Diagnosis Date  . Anemia    hx of-was on iron pills yrs ago  . Chronic back pain  DDD/scoliosis  . Constipation    takes OTC stool softener,laxatives,or enema  . Depression    not on any meds  . GERD (gastroesophageal reflux disease)    occasionally but no meds  . History of migraine    last one a week ago  . History of panic attacks    doesn't take meds  . Hyperlipidemia    was on meds but has been off for yrs-told much improved  . Migraine   . Muscle spasm    takes Flexeril daily as needed  . 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 PONV (postoperative nausea and vomiting)   . Seasonal allergies    takes Claritin  daily  . UTI (lower urinary tract infection)    treated in May with antibiotic  . Weakness    numbness and tingling down right left    No family history on file.  Past Surgical History:  Procedure Laterality Date  . ABDOMINAL HYSTERECTOMY    . BILATERAL FOOT SURGERY    . COLONOSCOPY    . EYE SURGERY Left   . knot removed from left side of neck    . MYOMECTOMY     to remove fibroids   Social History   Occupational History  . Not on file.   Social History Main Topics  . Smoking status: Never Smoker  . Smokeless tobacco: Never Used  . Alcohol use 0.0 oz/week     Comment: occasionally  . Drug use: No  . Sexual activity: Yes    Birth control/ protection: Surgical

## 2016-06-23 ENCOUNTER — Telehealth (INDEPENDENT_AMBULATORY_CARE_PROVIDER_SITE_OTHER): Payer: Self-pay | Admitting: *Deleted

## 2016-06-23 NOTE — Telephone Encounter (Signed)
Pt called stating she needed a PA for Voltaren gel.

## 2016-06-24 NOTE — Telephone Encounter (Signed)
PA was done through cover my meds

## 2016-06-24 NOTE — Telephone Encounter (Signed)
Fax came through and it was approved 06/24/16. Called pt. Pt aware.

## 2016-08-26 ENCOUNTER — Ambulatory Visit (INDEPENDENT_AMBULATORY_CARE_PROVIDER_SITE_OTHER): Payer: BC Managed Care – PPO | Admitting: Adult Health

## 2016-08-26 ENCOUNTER — Encounter (INDEPENDENT_AMBULATORY_CARE_PROVIDER_SITE_OTHER): Payer: Self-pay

## 2016-08-26 ENCOUNTER — Encounter: Payer: Self-pay | Admitting: Adult Health

## 2016-08-26 VITALS — BP 109/71 | HR 83 | Resp 16 | Ht 65.0 in | Wt 167.0 lb

## 2016-08-26 DIAGNOSIS — F329 Major depressive disorder, single episode, unspecified: Secondary | ICD-10-CM

## 2016-08-26 DIAGNOSIS — R4589 Other symptoms and signs involving emotional state: Secondary | ICD-10-CM

## 2016-08-26 DIAGNOSIS — G47419 Narcolepsy without cataplexy: Secondary | ICD-10-CM | POA: Diagnosis not present

## 2016-08-26 DIAGNOSIS — G479 Sleep disorder, unspecified: Secondary | ICD-10-CM | POA: Diagnosis not present

## 2016-08-26 MED ORDER — TRAZODONE HCL 50 MG PO TABS: 25.0000 mg | ORAL_TABLET | Freq: Every day | ORAL | 5 refills | Status: DC

## 2016-08-26 NOTE — Patient Instructions (Addendum)
Try taking trazodone 25 mg at bedtime  Improve sleep hygiene If your symptoms worsen or you develop new symptoms please let us know.   Narcolepsy Narcolepsy is a neurological disorder that causes you to fall asleep suddenly, and without control, during the daytime (sleep attacks). Narcolepsy is a lifelong (chronic) disorder. Normally, sleep follows a regular cycle over the course of the night. After about 90 minutes of light sleep, your sleep should become deeper. When your sleep becomes deeper, your body moves less and you start dreaming. This type of deep sleep is called rapid eye movement (REM) sleep. When you have narcolepsy, your REM sleep is not well-regulated. This disrupts your sleep cycle, which causes daytime sleepiness. What are the causes? The cause of narcolepsy is not fully understood, but it may be related to:  Low levels of hypocretin, a chemical (neurotransmitter) in the brain that controls sleep and wake cycles. Hypocretin imbalance may be caused by:  Abnormal genes that are passed from parent to child (inherited).  The body's defense system (immune system) attacking hypocretin brain cells (autoimmune disease).  Infection, tumor, or injury in the area of the brain that controls sleep.  Exposure to poisons (toxins), such as heavy metals, pesticides, and secondhand smoke. What are the signs or symptoms? Symptoms of this condition include:  Excessive daytime sleepiness. This is the most common symptom and is usually the first symptom you will notice. This may affect your performance at work or school.  Sleep attacks. This means falling asleep suddenly and without control. You may fall asleep in the middle of an activity, especially low-energy activities like reading or watching TV.  Feeling like you cannot think clearly.  Trouble focusing or remembering things.  Feeling depressed.  Sudden muscle weakness (cataplexy). When this occurs, your speech may become slurred, or  your knees may buckle. Cataplexy is usually triggered by surprise, anger, fear, or laughter.  Loss of the ability to speak or move (sleep paralysis). This may occur just as you start to fall asleep or wake up. You will be aware of the paralysis. It usually lasts for just a few seconds or minutes.  Seeing, hearing, tasting, smelling, or feeling things that are not real (hallucinations). Hallucinations may occur with sleep paralysis. They can happen when you are falling asleep, waking up, or dozing.  Trouble staying asleep at night (insomnia).  Restless sleep. How is this diagnosed? This condition may be diagnosed based on:  A physical exam to rule out any other problems that may be causing your symptoms.You may be asked to write down your sleeping patterns for several weeks in a sleep diary. This will help your health care provider make a diagnosis.  Sleep studies that measure how well your REM sleep is regulated. These tests also measure your heart rate, breathing, movement, and brain waves. These tests include:  An overnight sleep study (polysomnogram).  A daytime sleep study that is done while you take several naps during the day (multiple sleep latency test, MSLT). This test measures how quickly you fall asleep and how quickly you enter REM sleep.  Removal of spinal fluid to measure hypocretin levels. How is this treated? There is no cure for this condition, but treatment can help relieve symptoms. Treatment may include:  Lifestyle and sleeping strategies to help you cope with the condition, such as:  Exercising regularly.  Maintaining a regular sleep schedule.  Avoiding caffeine and large meals before bed.  Medicines. These may include:  Medicines that help keep  you awake and alert (stimulants) to fight daytime sleepiness.  Medicines that treat depression (antidepressants). These may be used to treat cataplexy.  Sodium oxybate. This is a strong medicine to help you relax  (sedative) that you may take at night. It can help control daytime sleepiness and cataplexy. Follow these instructions at home: Sleeping habits   Get about 8 hours of sleep every night.  Go to sleep and get up at about the same time every day.  Keep your bedroom dark, quiet, and comfortable.  When you feel very tired, take short naps. Schedule naps so that you take them at about the same time every day.  Tell your employer or teachers that you have narcolepsy. You may be able to adjust your schedule to include time for naps.  Before bedtime:  Avoid bright lights and screens.  Relax. Try activities like reading or taking a warm bath. Activity   Get at least 20 minutes of exercise every day. This will help you sleep better at night and reduce daytime sleepiness.  Avoid exercising within 3 hours of bedtime.  If you are sleepy, do not drive or use heavy machinery.  If possible, take a nap before driving.  Do not swim or go out on the water without a life jacket. Eating and drinking   Do not drink alcohol or caffeinated beverages within 4-5 hours of bedtime.  Do not eat a lot of food before bedtime. Eat meals at about the same times every day. General instructions   Take over-the-counter and prescription medicines only as told by your health care provider.  If directed, keep a sleep diary.  Tell your employer or teachers that you have narcolepsy. You may be able to adjust your schedule to include time for naps.  Do not use any products that contain nicotine or tobacco, such as cigarettes and e-cigarettes. If you need help quitting, ask your health care provider.  Keep all follow-up visits as told by your health care provider. This is important. Contact a health care provider if:  Your symptoms are not getting better.  You have increasingly high blood pressure (hypertension).  You have changes in your heart rhythm.  You are having a hard time determining what is real  and what is not (psychosis). Get help right away if:  You hurt yourself during a sleep attack or an attack of cataplexy.  You have chest pain.  You have trouble breathing. This information is not intended to replace advice given to you by your health care provider. Make sure you discuss any questions you have with your health care provider. Document Released: 04/10/2002 Document Revised: 04/13/2016 Document Reviewed: 04/13/2016 Elsevier Interactive Patient Education  2017 [employer].

## 2016-08-26 NOTE — Progress Notes (Addendum)
PATIENT: Amber Riley DOB: 05-11-1960  REASON FOR VISIT: follow up- narcolepsy HISTORY FROM: patient  HISTORY OF PRESENT ILLNESS: Mr. 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 She returns today for follow-up. She is no longer on Provigil as she did not find it beneficial. She states that she is currently taking trazodone however she cannot tolerate the whole tablet therefore she has been cutting the tablets into fourths. She reports that she does not take it every night because there are some nights that she does have to get up between 12 and 2 AM to pick up someone. She states that she typically goes to bed around 10 and her alarm goes off at 6:15. She states that she does try to take a nap at lunchtime and then after work. She does notice some day she feels very sad and often irritated. She denies any new neurological symptoms reported stay for an evaluation.  HISTORY copied from Dr.Dohmeier's notes: Amber Riley is a 56 y.o. female , seen here as a referral  from Dr. Marisue Humble / [facility] [place], [place] for Narcolepsy treatment.  Amber Riley reports that she was diagnosed with narcolepsy on 11/09/2005 by Dr. Baird Lyons. Her referring physician at the time was Dr. Idolina Primer. She had endorsed the Epworth sleepiness score at 20 out of 24 points had a BMI of 26.5 and a normal polysomnography without evidence of apnea. Overall AHI was 3.9. She slept all night in supine position. REM AHI was 7.5. She had 21% of REM sleep of total sleep time. REM latency was 86 minutes. The study was followed by an M SLT. She was allowed 5 naps in one of those she could not go to sleep but the others had a sleep latency of less than than 1.5 minutes. REM sleep latency was 10 minutes and 1 and 4 minutes and another nap. 3 naps had no REM sleep onset. 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 She was on Xyrem to 3-1/2 month, but has not been on Xyrem for almost 7 years. She remembers that she was not fully titrated at the time that she had to refrain from this therapy.  Further medical history positive for multinodular goiter, depression, migraine headaches, respiratory allergies,  Chief complaint according to patient : Excessive daytime sleepiness  Sleep habits are as follows: She usually goes to bed between 11 and 11:30 and does not have trouble to fall asleep, but cannot stay asleep. She often falls asleep on the sofa before going to the bedroom. Usually when watching TV. Her bedroom however is cool, quiet and dark and she is you have it usually going over to the bedroom by 1 AM.  Most nights she sleeps through, until 5 AM.  Sometimes a bed partner may snore and wake her, some nights she has a bathroom break but not every night. Her average sleep time at night is not sufficient at only 6 hours or less. She sleeps usually on her side but during the night will roll over and sleeps supine. She is usually using one or 2 pillows for neck support.Headaches are present many mornings, she feels neither refreshed nor restored.  She works from [employer] and tries to get a nap in at lunch, but these are neither refreshing or restorative. Once she arrives home after work she has to take a nap.  Sleep medical history and family sleep history:  Hypersomnia, father- had ESRD. Brother has insomnia.Niece on maternal site has narcolepsy.   Social history:  Works 8 to 5 PM, he does not drink caffeine containing sodas, ice tea nor coffee. She does drink alcohol about 2 a week, she does not use tobacco  REVIEW OF SYSTEMS: Out of a complete 14 system review of symptoms, the patient complains only of the  following symptoms, and all other reviewed systems are negative.  Back pain, muscle cramps, neck pain, agitation, confusion, depression, nervous/anxious, daytime sleepiness, constipation, frequency of urination, dizziness, unexpected weight change, ringing in ears, chest pain  ALLERGIES: No Known Allergies  HOME MEDICATIONS: Outpatient Medications Prior to Visit  Medication Sig Dispense Refill  . diclofenac sodium (VOLTAREN) 1 % GEL Apply 2 g topically 4 (four) times daily. 1 Tube 5  . estradiol (ESTRACE) 1 MG tablet Take 1 mg by mouth daily.    . fluticasone (FLONASE) 50 MCG/ACT nasal spray Place into both nostrils daily.    . meloxicam (MOBIC) 7.5 MG tablet Take 1 tablet (7.5 mg total) by mouth 2 (two) times daily as needed for pain. 30 tablet 2  . naproxen (NAPROSYN) 500 MG tablet Take 1 tablet (500 mg total) by mouth 2 (two) times daily. 30 tablet 0  . naproxen sodium (ANAPROX) 220 MG tablet Take 220 mg by mouth 2 (two) times daily with a meal.    . omeprazole (PRILOSEC) 20 MG capsule Take 1 capsule (20 mg total) by mouth daily. 14 capsule 0  . Probiotic Product (RESTORA PO) Take by mouth.    . Tetrahydrozoline HCl (EYE DROPS OP) Apply to eye. Systane    . traZODone (DESYREL) 50 MG tablet Take 1 tablet (50 mg total) by mouth at bedtime as needed for sleep. 30 tablet 1   No facility-administered medications prior to visit.     PAST MEDICAL HISTORY: Past Medical History:  Diagnosis Date  . Anemia    hx of-was on iron pills yrs ago  . Chronic back pain    DDD/scoliosis  . Constipation    takes OTC stool softener,laxatives,or enema  . Depression    not on any meds  . GERD (gastroesophageal reflux disease)    occasionally but no meds  . History of migraine    last one a week ago  . History of panic attacks    doesn't take meds  . Hyperlipidemia    was on meds but has been off for yrs-told much improved  . Migraine   . Muscle spasm    takes Flexeril daily as needed  .  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 PONV (postoperative nausea and vomiting)   . Seasonal allergies    takes Claritin daily  . UTI (lower urinary tract infection)    treated in May with antibiotic  . Weakness    numbness and tingling down right left    PAST SURGICAL HISTORY: Past Surgical History:  Procedure Laterality Date  . ABDOMINAL HYSTERECTOMY    . BILATERAL FOOT SURGERY    . COLONOSCOPY    . EYE SURGERY Left   . knot removed from left side of neck    . MYOMECTOMY     to remove  fibroids    FAMILY HISTORY: No family history on file.  SOCIAL HISTORY: Social History   Social History  . Marital status: Divorced    Spouse name: N/A  . Number of children: N/A  . Years of education: N/A   Occupational History  . Not on file.   Social History Main Topics  . Smoking status: Never Smoker  . Smokeless tobacco: Never Used  . Alcohol use 0.0 oz/week     Comment: occasionally  . Drug use: No  . Sexual activity: Yes    Birth control/ protection: Surgical   Other Topics Concern  . Not on file   Social History Narrative  . No narrative on file      PHYSICAL EXAM  Vitals:   08/26/16 0849  BP: 109/71  Pulse: 83  Resp: 16  Weight: 167 lb (75.8 kg)  Height: 5\' 5"  (1.651 m)   Body mass index is 27.79 kg/m.  Generalized: Well developed, in no acute distress   Neurological examination  Mentation: Alert oriented to time, place, history taking. Follows all commands speech and language fluent Cranial nerve II-XII: Pupils were equal round reactive to light. Extraocular movements were full, visual field were full on confrontational test. Facial sensation and strength were normal. Uvula tongue midline. Head turning and shoulder shrug  were normal and symmetric. Motor: The motor testing reveals 5 over 5 strength of all 4 extremities. Good symmetric motor tone is noted throughout.  Sensory: Sensory testing is intact to soft touch on all  4 extremities. No evidence of extinction is noted.  Coordination: Cerebellar testing reveals good finger-nose-finger and heel-to-shin bilaterally.  Gait and station: Gait is normal. Tandem gait is normal. Romberg is negative. No drift is seen.  Reflexes: Deep tendon reflexes are symmetric and normal bilaterally.   DIAGNOSTIC DATA (LABS, IMAGING, TESTING) - I reviewed patient records, labs, notes, testing and imaging myself where available.  Lab Results  Component Value Date   WBC 4.7 03/08/2016   HGB 14.2 03/08/2016   HCT 43.8 03/08/2016   MCV 87.4 03/08/2016   PLT 233 03/08/2016      Component Value Date/Time   NA 140 03/08/2016 1116   K 4.8 03/08/2016 1116   CL 109 03/08/2016 1116   CO2 24 03/08/2016 1116   GLUCOSE 102 (H) 03/08/2016 1116   BUN 10 03/08/2016 1116   CREATININE 0.92 03/08/2016 1116   CALCIUM 10.0 03/08/2016 1116   GFRNONAA >60 03/08/2016 1116   GFRAA >60 03/08/2016 1116    Lab Results  Component Value Date   TSH 1.177 04/14/2007      ASSESSMENT AND PLAN 56 y.o. year old female  has a past medical history of Anemia; Chronic back pain; Constipation; Depression; GERD (gastroesophageal reflux disease); History of migraine; History of panic attacks; Hyperlipidemia; Migraine; Muscle spasm; Narcolepsy; PONV (postoperative nausea and vomiting); Seasonal allergies; UTI (lower urinary tract infection); and Weakness. here with:  1. Narcolepsy 2. Disturbance of sleep 3. Depressed mood  I had a long discussion with the patient in regards to her sleep hygiene. Advised that she should avoid sleep interruptions. I encouraged patient to try to avoid getting up in the middle the night to go pick someone up. Encouraged the patient to try to take trazodone nightly as this may be beneficial for her sleep and depression. Patient voiced understanding. If she continues to have a time sleepiness that is intolerable she will let us know. She will follow-up in 6 months with Dr.  Brett Fairy  Ward Givens, MSN, NP-C 08/26/2016, 9:09 AM [facility] [address] [place], [place] [address] [phone]  I reviewed the above note and documentation by the Nurse Practitioner and agree with the history, physical exam, assessment and plan as outlined above. I was immediately available for face-to-face consultation. Star Age, MD, PhD [facility])

## 2016-10-10 ENCOUNTER — Emergency Department (HOSPITAL_COMMUNITY): Payer: BC Managed Care – PPO

## 2016-10-10 ENCOUNTER — Emergency Department (HOSPITAL_COMMUNITY)
Admission: EM | Admit: 2016-10-10 | Discharge: 2016-10-10 | Disposition: A | Discharge status: Home / Self Care | Payer: BC Managed Care – PPO | Attending: Emergency Medicine | Admitting: Emergency Medicine

## 2016-10-10 ENCOUNTER — Encounter (HOSPITAL_COMMUNITY): Payer: Self-pay | Admitting: *Deleted

## 2016-10-10 DIAGNOSIS — S81812A Laceration without foreign body, left lower leg, initial encounter: Secondary | ICD-10-CM | POA: Diagnosis not present

## 2016-10-10 DIAGNOSIS — Z791 Long term (current) use of non-steroidal anti-inflammatories (NSAID): Secondary | ICD-10-CM | POA: Insufficient documentation

## 2016-10-10 DIAGNOSIS — Y999 Unspecified external cause status: Secondary | ICD-10-CM | POA: Insufficient documentation

## 2016-10-10 DIAGNOSIS — Y939 Activity, unspecified: Secondary | ICD-10-CM | POA: Diagnosis not present

## 2016-10-10 DIAGNOSIS — Z79899 Other long term (current) drug therapy: Secondary | ICD-10-CM | POA: Diagnosis not present

## 2016-10-10 DIAGNOSIS — W25XXXA Contact with sharp glass, initial encounter: Secondary | ICD-10-CM | POA: Diagnosis not present

## 2016-10-10 DIAGNOSIS — Y929 Unspecified place or not applicable: Secondary | ICD-10-CM | POA: Diagnosis not present

## 2016-10-10 MED ORDER — LIDOCAINE-EPINEPHRINE (PF) 2 %-1:200000 IJ SOLN
10.0000 mL | Freq: Once | INTRAMUSCULAR | Status: AC
  Administered 2016-10-10: 10 mL
  Filled 2016-10-10: qty 20

## 2016-10-10 NOTE — Discharge Instructions (Signed)
Read attached information regarding wound care. Return in 7-10 days for suture removal. Take Tylenol or ibuprofen/Aleve for pain. Return to ED for fever, worsening pain, bleeding, drainage from site, trouble moving extremity or color or temperature change.

## 2016-10-10 NOTE — ED Notes (Signed)
Lower leg and foot cleaned and wrapped for pt. Pt departed in NAD.

## 2016-10-10 NOTE — ED Provider Notes (Signed)
Amber Riley   CSN: [id number] Arrival date & time: 10/10/16  1609  By signing my name below, I, Amber Riley, attest that this documentation has been prepared under the direction and in the presence of Amber Curenton, PA-C. Electronically Signed: Dora Riley, Scribe. 10/10/2016. 7:11 PM.  History   Chief Complaint Chief Complaint  Patient presents with  . Laceration   The history is provided by the patient. No language interpreter was used.    HPI Comments: Amber Riley is a 56 y.o. female who presents to the Emergency Department for evaluation of a laceration to her anterior left lower leg sustained shortly PTA. She states a glass table fell onto her left lower leg and subsequently shattered. Patient reports that a glass shard went into her left lower leg and caused a laceration. She notes she removed the glass shard and states the wound is now hemostatic after bleeding initially. She reports pain secondary to the wound and notes that weight bearing and ambulating exacerbate the pain. Tetanus status is UTD. Patient denies numbness/tingling, focal weakness, fevers, chills, dyspnea, other wounds, syncope, head injury, or any other associated symptoms.  Past Medical History:  Diagnosis Date  . Anemia    hx of-was on iron pills yrs ago  . Chronic back pain    DDD/scoliosis  . Constipation    takes OTC stool softener,laxatives,or enema  . Depression    not on any meds  . GERD (gastroesophageal reflux disease)    occasionally but no meds  . History of migraine    last one a week ago  . History of panic attacks    doesn't take meds  . Hyperlipidemia    was on meds but has been off for yrs-told much improved  . Migraine   . Muscle spasm    takes Flexeril daily as needed  . 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 PONV (postoperative nausea and vomiting)   . Seasonal allergies    takes Claritin daily  . UTI (lower urinary  tract infection)    treated in May with antibiotic  . Weakness    numbness and tingling down right left    Patient Active Problem List   Diagnosis Date Noted  . 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 Lumbago with sciatica 07/11/2014  . Right lumbar radiculitis 08/09/2013  . Radiculitis of right cervical region 08/09/2013  . Chronic right shoulder pain 08/09/2013  . URI 07/20/2007  . HOARSENESS 05/25/2007  . STRESS INCONTINENCE 05/04/2007  . NONTOXIC MULTINODULAR GOITER 04/14/2007  . MYALGIA 04/14/2007  . HEADACHE 03/02/2007  . ALLERGIC RHINITIS 06/14/2006  . DYSMENORRHEA, SEVERE 06/14/2006  . LEIOMYOMA, UTERUS 02/26/2006  . MIGRAINE HEADACHE 02/26/2006  . NARCOLEPSY W/O CATAPLEXY 02/26/2006  . 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 CONSTIPATION NOS 02/26/2006  . FX, ORBITAL FLOOR, OPEN 02/26/2006  . DEPRESSION, HX OF 02/26/2006    Past Surgical History:  Procedure Laterality Date  . ABDOMINAL HYSTERECTOMY    . BILATERAL FOOT SURGERY    . COLONOSCOPY    . EYE SURGERY Left   . knot removed from left side of neck    . MYOMECTOMY     to remove fibroids    OB History    No data available       Home Medications    Prior to Admission medications   Medication Sig Start Date End Date Taking? Authorizing Provider  diclofenac sodium (  VOLTAREN) 1 % GEL Apply 2 g topically 4 (four) times daily. 06/15/16   Leandrew Koyanagi, MD  estradiol (ESTRACE) 1 MG tablet Take 1 mg by mouth daily.    [provider]  fluticasone (FLONASE) 50 MCG/ACT nasal spray Place into both nostrils daily.    [provider]  meloxicam (MOBIC) 7.5 MG tablet Take 1 tablet (7.5 mg total) by mouth 2 (two) times daily as needed for pain. 06/01/16   Leandrew Koyanagi, MD  naproxen (NAPROSYN) 500 MG tablet Take 1 tablet (500 mg total) by mouth 2 (two) times daily. 03/08/16   Dorie Rank, MD  naproxen sodium (ANAPROX) 220 MG tablet Take 220 mg by mouth 2 (two) times daily with a meal.     [provider]  omeprazole (PRILOSEC) 20 MG capsule Take 1 capsule (20 mg total) by mouth daily. 03/08/16   Dorie Rank, MD  Probiotic Product (RESTORA PO) Take by mouth.    [provider]  Tetrahydrozoline HCl (EYE DROPS OP) Apply to eye. Systane    [provider]  traZODone (DESYREL) 50 MG tablet Take 0.5 tablets (25 mg total) by mouth at bedtime. 08/26/16   Ward Givens, NP    Family History History reviewed. No pertinent family history.  Social History Social History  Substance Use Topics  . Smoking status: Never Smoker  . Smokeless tobacco: Never Used  . Alcohol use 0.0 oz/week     Comment: occasionally     Allergies   Patient has no known allergies.   Review of Systems Review of Systems  Constitutional: Negative for chills and fever.  Respiratory: Negative for shortness of breath.   Skin: Positive for wound.  Neurological: Negative for weakness and numbness.   Physical Exam Updated Vital Signs BP 112/89 (BP Location: Left Arm)   Pulse 90   Temp 98.9 F (37.2 C) (Oral)   Resp 19   SpO2 99%   Physical Exam  Constitutional: She appears well-developed and well-nourished. No distress.  HENT:  Head: Normocephalic and atraumatic.  Eyes: Conjunctivae and EOM are normal. No scleral icterus.  Neck: Normal range of motion.  Pulmonary/Chest: Effort normal. No respiratory distress.  Neurological: She is alert.  Skin: No rash noted. She is not diaphoretic.  Approx 2cm laceration on the L shin. Good ROM of leg, ankle and toes. Bleeding controlled. No signs of deep tendon or tissue involvement.  Psychiatric: She has a normal mood and affect.  Nursing Riley and vitals reviewed.     ED Treatments / Results  Labs (all labs ordered are listed, but only abnormal results are displayed) Labs Reviewed - No data to display  EKG  EKG Interpretation None       Radiology Dg Ankle Complete Left  Result Date: 10/10/2016 CLINICAL DATA:  Low  laceration of the left ankle secondary to a shattered glass table. EXAM: LEFT ANKLE COMPLETE - 3+ VIEW COMPARISON:  None. FINDINGS: There is no evidence of fracture, dislocation, or joint effusion. There is no evidence of arthropathy or other focal bone abnormality. Soft tissues are unremarkable. No radiopaque foreign body is present. IMPRESSION: Negative left ankle radiographs. Electronically Signed   By: San Morelle M.D.   On: 10/10/2016 16:55    Procedures .Marland KitchenLaceration Repair Date/Time: 10/10/2016 7:31 PM Performed by: Delia Heady Authorized by: Delia Heady   Consent:    Consent obtained:  Verbal   Consent given by:  Patient Anesthesia (see MAR for exact dosages):    Anesthesia method:  Local infiltration   Local anesthetic:  Lidocaine 2% WITH epi Laceration details:    Location:  Leg   Leg location:  L lower leg Repair type:    Repair type:  Simple Pre-procedure details:    Preparation:  Patient was prepped and draped in usual sterile fashion Exploration:    Hemostasis achieved with:  Direct pressure and epinephrine   Wound exploration: wound explored through full range of motion     Contaminated: no   Treatment:    Area cleansed with:  Betadine   Amount of cleaning:  Standard   Irrigation solution:  Sterile saline   Irrigation method:  Syringe Skin repair:    Repair method:  Sutures   Suture size:  4-0   Suture material:  Nylon (Ethilon)   Suture technique:  Simple interrupted   Number of sutures:  4 Approximation:    Approximation:  Close   Vermilion border: well-aligned   Post-procedure details:    Dressing:  Open (no dressing)   Patient tolerance of procedure:  Tolerated well, no immediate complications      (including critical care time)  DIAGNOSTIC STUDIES: Oxygen Saturation is 99% on RA, normal by my interpretation.    COORDINATION OF CARE: 7:11 PM Discussed treatment plan with pt at bedside and pt agreed to plan.  Medications Ordered in  ED Medications  lidocaine-EPINEPHrine (XYLOCAINE W/EPI) 2 %-1:200000 (PF) injection 10 mL (10 mLs Infiltration Given 10/10/16 1931)     Initial Impression / Assessment and Plan / ED Course  I have reviewed the triage vital signs and the nursing notes.  Pertinent labs & imaging results that were available during my care of the patient were reviewed by me and considered in my medical decision making (see chart for details).  Patient presents with leg laceration that occurred PTA. Approx 2cm laceration seen on L shin. Xray negative. Good active ROM of leg, ankle and toes. Normal sensation throughout leg. No signs of foreign body in wound. Tetanus UTD. Discussed laceration care with pt and answered questions. Return for suture removal in 7 days and wound check sooner should there be signs of dehiscence or infection. No need for antibiotic prophylaxis at this time. Patient advised to take Tylenol or ibuprofen as needed for pain. Pt is hemodynamically stable with other complaints at this time. Strict return precautions given.    Final Clinical Impressions(s) / ED Diagnoses   Final diagnoses:  Laceration of left lower extremity, initial encounter    New Prescriptions Discharge Medication List as of 10/10/2016  7:58 PM     I personally performed the services described in this documentation, which was scribed in my presence. The recorded information has been reviewed and is accurate.    Delia Heady, PA-C 10/12/16 1452    Fatima Blank, MD 10/13/16 [phone]

## 2016-10-10 NOTE — ED Notes (Signed)
ED Provider at bedside. 

## 2016-10-10 NOTE — ED Triage Notes (Signed)
Pt reports a glass table breaking and has laceration to left anterior ankle and small laceration to left foot. No bleeding noted at triage.

## 2016-12-17 ENCOUNTER — Other Ambulatory Visit: Payer: Self-pay | Admitting: Physician Assistant

## 2016-12-17 DIAGNOSIS — Z1231 Encounter for screening mammogram for malignant neoplasm of breast: Secondary | ICD-10-CM

## 2016-12-29 ENCOUNTER — Ambulatory Visit
Admission: RE | Admit: 2016-12-29 | Discharge: 2016-12-29 | Disposition: A | Discharge status: Home / Self Care | Payer: BC Managed Care – PPO | Source: Ambulatory Visit | Attending: Physician Assistant | Admitting: Physician Assistant

## 2016-12-29 DIAGNOSIS — Z1231 Encounter for screening mammogram for malignant neoplasm of breast: Secondary | ICD-10-CM

## 2017-01-13 ENCOUNTER — Other Ambulatory Visit: Payer: Self-pay | Admitting: Neurology

## 2017-02-15 ENCOUNTER — Encounter (INDEPENDENT_AMBULATORY_CARE_PROVIDER_SITE_OTHER): Payer: Self-pay | Admitting: Orthopaedic Surgery

## 2017-02-15 ENCOUNTER — Ambulatory Visit (INDEPENDENT_AMBULATORY_CARE_PROVIDER_SITE_OTHER): Payer: BC Managed Care – PPO

## 2017-02-15 ENCOUNTER — Ambulatory Visit (INDEPENDENT_AMBULATORY_CARE_PROVIDER_SITE_OTHER): Payer: BC Managed Care – PPO | Admitting: Orthopaedic Surgery

## 2017-02-15 DIAGNOSIS — M25512 Pain in left shoulder: Secondary | ICD-10-CM

## 2017-02-15 MED ORDER — MELOXICAM 7.5 MG PO TABS
7.5000 mg | ORAL_TABLET | Freq: Two times a day (BID) | ORAL | 2 refills | Status: DC | PRN
Start: 2017-02-15 — End: 2018-03-07

## 2017-02-15 MED ORDER — BUPIVACAINE HCL 0.5 % IJ SOLN
3.0000 mL | INTRAMUSCULAR | Status: AC | PRN
  Administered 2017-02-15: 3 mL via INTRA_ARTICULAR

## 2017-02-15 MED ORDER — LIDOCAINE HCL 1 % IJ SOLN
3.0000 mL | INTRAMUSCULAR | Status: AC | PRN
  Administered 2017-02-15: 3 mL

## 2017-02-15 MED ORDER — METHYLPREDNISOLONE ACETATE 40 MG/ML IJ SUSP
40.0000 mg | INTRAMUSCULAR | Status: AC | PRN
  Administered 2017-02-15: 40 mg via INTRA_ARTICULAR
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
## 2017-02-25 ENCOUNTER — Ambulatory Visit: Payer: BC Managed Care – PPO | Admitting: Neurology

## 2017-03-05 ENCOUNTER — Ambulatory Visit (INDEPENDENT_AMBULATORY_CARE_PROVIDER_SITE_OTHER): Payer: BC Managed Care – PPO | Admitting: Neurology

## 2017-03-05 ENCOUNTER — Encounter: Payer: Self-pay | Admitting: Neurology

## 2017-03-05 VITALS — BP 109/68 | HR 103 | Ht 65.0 in | Wt 168.0 lb

## 2017-03-05 DIAGNOSIS — F4321 Adjustment disorder with depressed mood: Secondary | ICD-10-CM | POA: Diagnosis not present

## 2017-03-05 DIAGNOSIS — G47419 Narcolepsy without cataplexy: Secondary | ICD-10-CM | POA: Diagnosis not present

## 2017-03-05 MED ORDER — ARMODAFINIL 250 MG PO TABS: 250.0000 mg | ORAL_TABLET | Freq: Every day | ORAL | 5 refills | Status: DC

## 2017-03-05 NOTE — Patient Instructions (Signed)
Armodafinil tablets What is this medicine? ARMODAFINIL (ar moe DAF i nil) is used to treat excessive sleepiness caused by certain sleep disorders. This includes narcolepsy, sleep apnea, and shift work sleep disorder. This medicine may be used for other purposes; ask your health care provider or pharmacist if you have questions. COMMON BRAND NAME(S): Nuvigil What should I tell my health care provider before I take this medicine? They need to know if you have any of these conditions: -bipolar disorder -depression -drug or alcohol abuse or addiction -heart disease -high blood pressure -kidney disease -liver disease -schizophrenia -suicidal thoughts, plans, or attempt; a previous suicide attempt by you or a family member -an unusual or allergic reaction to armodafinil, modafinil, medicines, foods, dyes, or preservatives -pregnant or trying to get pregnant -breast-feeding How should I use this medicine? Take this medicine by mouth with a glass of water. Follow the directions on the prescription label. Take your doses at regular intervals. Do not take your medicine more often than directed. Do not stop taking this medicine suddenly except upon the advice of your doctor. Stopping this medicine too quickly may cause serious side effects or your condition may worsen. A special MedGuide will be given to you by the pharmacist with each prescription and refill. Be sure to read this information carefully each time. Talk to your pediatrician regarding the use of this medicine in children. While this drug may be prescribed for children as young as 17 years of age for selected conditions, precautions do apply. Overdosage: If you think you have taken too much of this medicine contact a poison control center or emergency room at once. NOTE: This medicine is only for you. Do not share this medicine with others. What if I miss a dose? If you miss a dose, take it as soon as you can. If it is almost time for  your next dose, take only that dose. Do not take double or extra doses. What may interact with this medicine? Do not take this medicine with any of the following medications: -amphetamine or dextroamphetamine -dexmethylphenidate or methylphenidate -MAOIs like Carbex, Eldepryl, Marplan, Nardil, and Parnate -pemoline -procarbazine This medicine may also interact with the following medications: -antifungal medicines like itraconazole or ketoconazole -barbiturates, like phenobarbital -birth control pills or other hormone-containing birth control devices or implants -carbamazepine -cyclosporine -diazepam -medicines for depression, anxiety, or psychotic disturbances -phenytoin -propranolol -triazolam -warfarin This list may not describe all possible interactions. Give your health care provider a list of all the medicines, herbs, non-prescription drugs, or dietary supplements you use. Also tell them if you smoke, drink alcohol, or use illegal drugs. Some items may interact with your medicine. What should I watch for while using this medicine? Visit your doctor or health care professional for regular checks on your progress. The full effect of this medicine may not be seen right away. This medicine may affect your concentration, function, or may hide signs that you are tired. You may get dizzy. This medicine will not eliminate your abnormal tendency to fall asleep and is not a replacement for sleep. Do not change your previous behavior regarding potentially dangerous activities, such as driving, using machinery, or doing anything that needs mental alertness until you know how this drug affects you. Alcohol can make you more dizzy and may interfere with your response to this medicine or your alertness. Avoid alcoholic drinks. Birth control pills may not work properly while you are taking this medicine. While using birth control pills, you will need an   additional barrier method or an alternative  non-hormonal method of birth control during treatment with armodafinil and for 1 month after stopping armodafinil. Talk to your doctor about which extra method of birth control is right for you. It is unknown if the effects of this medicine will be increased by the use of caffeine. Caffeine is found in many foods, beverages, and medications. Ask your doctor if you should limit or change your intake of caffeine-containing products while on this medicine. Do not stop previously prescribed treatments for your condition, such as a CPAP machine, except on the advise of your physician or health care professional. What side effects may I notice from receiving this medicine? Side effects that you should report to your doctor or health care professional as soon as possible: -allergic reactions like skin rash, itching or hives, swelling of the face, lips, or tongue -anxiety -breathing or swallowing problems -chest pain -depressed mood -elevated mood, decreased need for sleep, racing thoughts, impulsive behavior -fast, irregular heartbeat -fever with rash, swollen lymph nodes, or swelling of the face -hallucination, loss of contact with reality -increased blood pressure -mouth sores, blisters, or peeling skin -sore throat, fever, or chills -suicidal thoughts or other mood changes -tremors -vomiting Side effects that usually do not require medical attention (report to your doctor or health care professional if they continue or are bothersome): -headache -nausea, diarrhea, or stomach upset -nervousness -trouble sleeping This list may not describe all possible side effects. Call your doctor for medical advice about side effects. You may report side effects to FDA at 1-800-FDA-1088. Where should I keep my medicine? Keep out of the reach of children. Store at room temperature between 20 and 25 degrees C (68 and 77 degrees F). Throw away any unused medicine after the expiration date. NOTE: This sheet is  a summary. It may not cover all possible information. If you have questions about this medicine, talk to your doctor, pharmacist, or health care provider.  2018 Elsevier/Gold Standard (2015-09-27 18:16:30)  Dear Amber Riley; I would like for you to consider XYREM should NUVIGIL fail to maintain daytime alertness.  As discussed, Xyrem has to be taken with very mindful caution: Taking Xyrem correctly is key. This means, take it only when you are fully ready to fall asleep, while in bed and refrain from doing any other activities, even brushing  your teeth after taking your first dose. The second dose will be about 2-1/2-4 hours after his first dose. You can go to the bathroom before your 2nd dose. Take your first dose, when actually IN BED, ready to sleep. No sitting up in bed, NO reading, NO using the cell phone or computer, NO getting up to use the bathroom. Take care of everything BEFORE sleep time. Try NOT to skip the second dose as the Xyrem is not going to stay in your system long enough with only one dose. Do not drink alcohol with Xyrem. If you do drink Alcohol, you cannot take your Xyrem doses that night.

## 2017-03-05 NOTE — Progress Notes (Signed)
PATIENT: Amber Riley DOB: 06-05-1960  REASON FOR VISIT: follow up- narcolepsy HISTORY FROM: patient  Interval history from 05 March 2017, I have the pleasure of seeing Amber Riley today, and a 56 year old African-American patient with a history of poorly controlled narcolepsy.  She continues to have irresistible urge to sleep and daytime, struggles to stay awake but her nocturnal sleep has slightly improved since she takes trazodone.  She still has symptoms of significant depression which are common in narcoleptic patients.  She endorsed today the Epworth sleepiness score of 15 points of the fatigue severity at 50. she felt no benefit from Provigil- it did not help with work place alertness. 8 years ago she was briefly on XYREM- it is not clear that she ever titrated to optimum dose. 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 She doesn't want to return to [facility] " its salty, nasty and I have me a cocktail and can't take it" . She is willing to try NuVigil- concerned about feeling grumpy or agitated on it. There may be more depression than can be addressed on Trazodone.   I have assured her she can stop taking it if she feels it has such effect on her.    HISTORY OF PRESENT ILLNESS: MM 08-11-2016, Mr. Keats is a 56 year old with a history of narcolepsy. She returns today for follow-up. She is no longer on Provigil as she did not find it beneficial. She states that she is currently taking trazodone however she cannot tolerate the whole tablet therefore she has been cutting the tablets into fourths. She reports that she does not take it every night because there are some nights that she does have to get up between 12 and 2 AM to pick up someone. She states that she typically goes to bed around 10 and her alarm goes off at 6:15. She states that she does try to take a nap at lunchtime and then after work. She does notice some day she feels very sad and often irritated. She denies any new  neurological symptoms reported stay for an evaluation.  HISTORY copied from Dr.Marceil Welp's notes: Amber Riley is a 56 y.o. female , seen here as a referral  from Dr. Marisue Humble / [facility] [place], [place] for Narcolepsy treatment.  [name] reports that she was diagnosed with narcolepsy on 11/09/2005 by Dr. Baird Lyons. Her referring physician at the time was Dr. Idolina Primer. She had endorsed the Epworth sleepiness score at 20 out of 24 points had a BMI of 26.5 and a normal polysomnography without evidence of apnea. Overall AHI was 3.9. She slept all night in supine position. REM AHI was 7.5. She had 21% of REM sleep of total sleep time. REM latency was 86 minutes. The study was followed by an M SLT. She was allowed 5 naps in one of those she could not go to sleep but the others had a sleep latency of less than than 1.5 minutes. REM sleep latency was 10 minutes and 1 and 4 minutes and another nap. 3 naps had no REM sleep onset. 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 She was on Xyrem to 3-1/2 month, but has not  been on Xyrem for almost 7 years. She remembers that she was not fully titrated at the time that she had to refrain from this therapy.  Further medical history positive for multinodular goiter, depression, migraine headaches, respiratory allergies,  Chief complaint according to patient : Excessive daytime sleepiness  Sleep habits are as follows: She usually goes to bed between 11 and 11:30 and does not have trouble to fall asleep, but cannot stay asleep. She often falls asleep on the sofa before going to the bedroom. Usually when watching TV. Her bedroom however is cool, quiet and dark and  she is you have it usually going over to the bedroom by 1 AM.  Most nights she sleeps through, until 5 AM.  Sometimes a bed partner may snore and wake her, some nights she has a bathroom break but not every night. Her average sleep time at night is not sufficient at only 6 hours or less. She sleeps usually on her side but during the night will roll over and sleeps supine. She is usually using one or 2 pillows for neck support.Headaches are present many mornings, she feels neither refreshed nor restored.  She works from [employer] and tries to get a nap in at lunch, but these are neither refreshing or restorative. Once she arrives home after work she has to take a nap.  Sleep medical history and family sleep history:  Hypersomnia, father- had ESRD. Brother has insomnia.Niece on maternal site has narcolepsy.   Social history:  Works 8 to 5 PM, he does not drink caffeine containing sodas, ice tea nor coffee. She does drink alcohol about 2 a week, she does not use tobacco  REVIEW OF SYSTEMS: Out of a complete 14 system review of symptoms, the patient complains only of the following symptoms, and all other reviewed systems are negative.  Back pain, muscle cramps, neck pain, agitation, confusion, depression, nervous/anxious, daytime sleepiness, constipation, frequency of urination, dizziness, unexpected weight change, ringing in ears, chest pain  ALLERGIES: No Known Allergies  HOME MEDICATIONS: Outpatient Medications Prior to Visit  Medication Sig Dispense Refill  . diclofenac sodium (VOLTAREN) 1 % GEL Apply 2 g topically 4 (four) times daily. 1 Tube 5  . estradiol (ESTRACE) 1 MG tablet Take 1 mg by mouth daily.    . fluticasone (FLONASE) 50 MCG/ACT nasal spray Place into both nostrils daily.    . meloxicam (MOBIC) 7.5 MG tablet Take 1 tablet (7.5 mg total) by mouth 2 (two) times daily as needed for pain. 30 tablet 2  . meloxicam (MOBIC) 7.5 MG tablet Take 1 tablet (7.5 mg total) by mouth 2 (two)  times daily as needed for pain. 30 tablet 2  . naproxen (NAPROSYN) 500 MG tablet Take 1 tablet (500 mg total) by mouth 2 (two) times daily. 30 tablet 0  . naproxen sodium (ANAPROX) 220 MG tablet Take 220 mg by mouth 2 (two) times daily with a meal.    . omeprazole (PRILOSEC) 20 MG capsule Take 1 capsule (20 mg total) by mouth daily. 14 capsule 0  . Probiotic Product (RESTORA PO) Take by mouth.    . Tetrahydrozoline HCl (EYE DROPS OP) Apply to eye. Systane    . traZODone (DESYREL) 50 MG tablet Take 0.5 tablets (25 mg total) by mouth at bedtime. 30 tablet 5   No facility-administered medications prior to visit.     PAST MEDICAL HISTORY: Past Medical History:  Diagnosis Date  . Anemia    hx of-was on iron pills  yrs ago  . Chronic back pain    DDD/scoliosis  . Constipation    takes OTC stool softener,laxatives,or enema  . Depression    not on any meds  . GERD (gastroesophageal reflux disease)    occasionally but no meds  . History of migraine    last one a week ago  . History of panic attacks    doesn't take meds  . Hyperlipidemia    was on meds but has been off for yrs-told much improved  . Migraine   . Muscle spasm    takes Flexeril daily as needed  . 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 PONV (postoperative nausea and vomiting)   . Seasonal allergies    takes Claritin daily  . UTI (lower urinary tract infection)    treated in May with antibiotic  . Weakness    numbness and tingling down right left    PAST SURGICAL HISTORY: Past Surgical History:  Procedure Laterality Date  . ABDOMINAL HYSTERECTOMY    . BILATERAL FOOT SURGERY    . COLONOSCOPY    . EYE SURGERY Left   . knot removed from left side of neck    . MYOMECTOMY     to remove fibroids    FAMILY HISTORY: No family history on file.  SOCIAL HISTORY: patient quit coffee, no energy drinks. She still drinks alcohol.  Social History   Social History  . Marital status: Divorced      Spouse name: N/A  . Number of children: N/A  . Years of education: N/A   Occupational History  . Not on file.   Social History Main Topics  . Smoking status: Never Smoker  . Smokeless tobacco: Never Used  . Alcohol use 0.0 oz/week     Comment: occasionally  . Drug use: No  . Sexual activity: Yes    Birth control/ protection: Surgical   Other Topics Concern  . Not on file   Social History Narrative  . No narrative on file      PHYSICAL EXAM  Vitals:   03/05/17 0827  BP: 109/68  Pulse: (!) 103  Weight: 168 lb (76.2 kg)  Height: 5\' 5"  (1.651 m)   Body mass index is 27.96 kg/m.  Generalized: Well developed, in no acute distress- pressured speech. patient was asleep as I entered the room.   Neurological examination  Mentation: Alert oriented to time, place, history taking. Follows all commands speech and language fluent Cranial nerve : no taste changes, no changes in smell. Pupils round and reactive to light.Uvula tongue midline. Head turning and shoulder shrug  were normal and symmetric. Motor:  5/  5 strength of all 4 extremities,symmetric bulk and motor tone is noted throughout.   finger-nose-finger intact bilaterally.   DIAGNOSTIC DATA (LABS, IMAGING, TESTING) - I reviewed patient records, labs, notes, testing and imaging myself where available. No new lab-    ASSESSMENT AND PLAN 56 y.o. year old female  has a past medical history of Anemia; Chronic back pain; Constipation; Depression; GERD (gastroesophageal reflux disease); History of migraine; History of panic attacks; Hyperlipidemia; Migraine; Muscle spasm; Narcolepsy; PONV (postoperative nausea and vomiting); Seasonal allergies; UTI (lower urinary tract infection); and Weakness. here with:  1. Narcolepsy without cataplexy, poorly controlled - presents with persistent excessive daytime sleepiness.  2. Disturbance of sleep 3. Depressed mood  I had a long discussion with the patient in regards to her sleep  hygiene. Advised that  she should avoid sleep interruptions. I encouraged patient to try to avoid getting up in the middle the night to go pick someone up. Encouraged the patient to try to take trazodone nightly as this may be beneficial for her sleep and depression.  She continues to have a time sleepiness that is intolerable and needs a medication change to address daytime stimulation.  I chose with her to use NUVIGIL ( armodafinil ) - 250 mg in AM, she assured me she is not taking caffeine.   She will follow-up in 6 months with Ward Givens, NP      Larey Seat, MD  03/05/2017, 8:37 AM [facility] [address], [place], [place] [address] [phone]  [facility] ([facility])

## 2017-03-08 ENCOUNTER — Telehealth: Payer: Self-pay | Admitting: Neurology

## 2017-03-08 NOTE — Telephone Encounter (Signed)
Received response via cover my meds and this request is approved for the following time period:  03/08/2017 - 03/08/2018  PA# [place] Non-Grandfathered [id number] BS

## 2017-03-08 NOTE — Telephone Encounter (Signed)
PA completed armodafinil. KEY: [id number] Should hear back from [facility] within 24 hrs

## 2017-03-16 ENCOUNTER — Telehealth: Payer: Self-pay | Admitting: Neurology

## 2017-03-16 NOTE — Telephone Encounter (Signed)
I have explained all these symptoms to Dr Brett Fairy and at this time Dr Brett Fairy would like to decrease the patient to a 1/2 tab daily and see if this is beneficial for the patient without the strong SE. I have encouraged the patient to drink plenty of water to help with the dry mouth and I informed the patient to not drink any caffeine beverages. Pt verbalized understanding. Pt had no questions at this time but was encouraged to call back if questions arise. Pt will keep [place] updated on the symptoms and if they are still helped by the decrease dosages.

## 2017-03-16 NOTE — Telephone Encounter (Signed)
Pt called she has taken 1 dose of Armodafinil 250 MG tablet. She ate a yougart before taking the medication, then afterwards her mouth became dry, she became fatigued, felt a choking in throat, pressure pain in the chest and today down the rt arm, she stayed cold all day and had a HA all day. She took a trazadone last night to help her sleep but it was a restless sleep, she woke several times during the night. Pt is still experiencing dull, pressure pain in the chest and down the rt arm today. Call transferred to RN

## 2017-03-18 ENCOUNTER — Emergency Department (HOSPITAL_COMMUNITY): Payer: BC Managed Care – PPO

## 2017-03-18 ENCOUNTER — Other Ambulatory Visit: Payer: Self-pay

## 2017-03-18 ENCOUNTER — Emergency Department (HOSPITAL_COMMUNITY)
Admission: EM | Admit: 2017-03-18 | Discharge: 2017-03-19 | Disposition: A | Discharge status: Home / Self Care | Payer: BC Managed Care – PPO | Attending: Emergency Medicine | Admitting: Emergency Medicine

## 2017-03-18 ENCOUNTER — Encounter (HOSPITAL_COMMUNITY): Payer: Self-pay

## 2017-03-18 DIAGNOSIS — Z79899 Other long term (current) drug therapy: Secondary | ICD-10-CM | POA: Insufficient documentation

## 2017-03-18 DIAGNOSIS — E041 Nontoxic single thyroid nodule: Secondary | ICD-10-CM | POA: Insufficient documentation

## 2017-03-18 DIAGNOSIS — R0789 Other chest pain: Secondary | ICD-10-CM | POA: Insufficient documentation

## 2017-03-18 LAB — CBC
HCT: 41.8 % (ref 36.0–46.0)
Hemoglobin: 13.7 g/dL (ref 12.0–15.0)
MCH: 28.5 pg (ref 26.0–34.0)
MCHC: 32.8 g/dL (ref 30.0–36.0)
MCV: 87.1 fL (ref 78.0–100.0)
Platelets: 222 10*3/uL (ref 150–400)
RBC: 4.8 MIL/uL (ref 3.87–5.11)
RDW: 13.3 % (ref 11.5–15.5)
WBC: 5.9 10*3/uL (ref 4.0–10.5)

## 2017-03-18 LAB — D-DIMER, QUANTITATIVE: D-Dimer, Quant: 0.69 ug/mL-FEU — ABNORMAL HIGH (ref 0.00–0.50)

## 2017-03-18 LAB — BASIC METABOLIC PANEL
Anion gap: 6 (ref 5–15)
BUN: 10 mg/dL (ref 6–20)
CO2: 26 mmol/L (ref 22–32)
Calcium: 9.8 mg/dL (ref 8.9–10.3)
Chloride: 105 mmol/L (ref 101–111)
Creatinine, Ser: 0.95 mg/dL (ref 0.44–1.00)
GFR calc Af Amer: >60 mL/min (ref >60)
GFR calc non Af Amer: >60 mL/min (ref >60)
Glucose, Bld: 89 mg/dL (ref 65–99)
Potassium: 4.3 mmol/L (ref 3.5–5.1)
Sodium: 137 mmol/L (ref 135–145)

## 2017-03-18 LAB — I-STAT TROPONIN, ED
Troponin i, poc: 0 ng/mL (ref 0.00–0.08)
Troponin i, poc: 0 ng/mL (ref 0.00–0.08)

## 2017-03-18 MED ORDER — IOPAMIDOL (ISOVUE-370) INJECTION 76%
INTRAVENOUS | Status: AC
  Administered 2017-03-18: 100 mL
  Filled 2017-03-18: qty 100

## 2017-03-18 NOTE — ED Notes (Signed)
Patient transported to CT 

## 2017-03-18 NOTE — ED Notes (Signed)
Pt up to br with adscess

## 2017-03-18 NOTE — ED Triage Notes (Signed)
Pt endorses right sided chest pain and congestion x 2 days. Pt states that she thought that she was getting a cold but the chest pain has remained constant. No radiation. VSS

## 2017-03-18 NOTE — ED Notes (Addendum)
Per Dorothea Ogle, EMT, unable to obtain I-stat troponin. This RN to attempt blood draw. Pt alert and environment secured.

## 2017-03-18 NOTE — ED Provider Notes (Signed)
[facility] EMERGENCY DEPARTMENT Provider Note   CSN: [id number] Arrival date & time: 03/18/17  1725     History   Chief Complaint Chief Complaint  Patient presents with  . Chest Pain    HPI Amber Riley is a 56 y.o. female.  56yo F w/ h/o HLD, depression, migraines, GERD who p/w chest pain.  The patient reports a 2-day history of chest pain that she describes as feeling initially like she was getting a cold or like indigestion but she has not had any significant cough or upper respiratory infection symptoms.  The pain is central to right sided, sometimes radiating down her right arm and neck.  The pain is constant but sometimes seems to ease off.  She reports a stinging feeling and states it feels like something is in her throat.  Nothing makes the pain better or worse and it is not associated with exertion.  She denies any associated shortness of breath, vomiting, or diaphoresis but does report mild nausea.  She reports associated headache and lightheadedness.  No leg swelling or pain, recent travel, or history of cancer.  She does use estrogen.  No family history of heart disease.  She is a non-smoker, no cocaine use.  No fevers or recent illness.   The history is provided by the patient.  Chest Pain      Past Medical History:  Diagnosis Date  . Anemia    hx of-was on iron pills yrs ago  . Chronic back pain    DDD/scoliosis  . Constipation    takes OTC stool softener,laxatives,or enema  . Depression    not on any meds  . GERD (gastroesophageal reflux disease)    occasionally but no meds  . History of migraine    last one a week ago  . History of panic attacks    doesn't take meds  . Hyperlipidemia    was on meds but has been off for yrs-told much improved  . Migraine   . Muscle spasm    takes Flexeril daily as needed  . 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 PONV (postoperative nausea and vomiting)   . Seasonal  allergies    takes Claritin daily  . UTI (lower urinary tract infection)    treated in May with antibiotic  . Weakness    numbness and tingling down right left    Patient Active Problem List   Diagnosis Date Noted  . 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 Lumbago with sciatica 07/11/2014  . Right lumbar radiculitis 08/09/2013  . Radiculitis of right cervical region 08/09/2013  . Chronic right shoulder pain 08/09/2013  . URI 07/20/2007  . HOARSENESS 05/25/2007  . STRESS INCONTINENCE 05/04/2007  . NONTOXIC MULTINODULAR GOITER 04/14/2007  . MYALGIA 04/14/2007  . HEADACHE 03/02/2007  . ALLERGIC RHINITIS 06/14/2006  . DYSMENORRHEA, SEVERE 06/14/2006  . LEIOMYOMA, UTERUS 02/26/2006  . MIGRAINE HEADACHE 02/26/2006  . NARCOLEPSY W/O CATAPLEXY 02/26/2006  . 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 CONSTIPATION NOS 02/26/2006  . FX, ORBITAL FLOOR, OPEN 02/26/2006  . DEPRESSION, HX OF 02/26/2006    Past Surgical History:  Procedure Laterality Date  . ABDOMINAL HYSTERECTOMY    . BILATERAL FOOT SURGERY    . COLONOSCOPY    . EYE SURGERY Left   . knot removed from left side of neck    . MYOMECTOMY     to remove fibroids  OB History    No data available       Home Medications    Prior to Admission medications   Medication Sig Start Date End Date Taking? Authorizing Provider  Armodafinil 250 MG tablet Take 1 tablet (250 mg total) by mouth daily. Patient taking differently: Take 125 mg daily by mouth.  03/05/17  Yes Dohmeier, Asencion Partridge, MD  atorvastatin (LIPITOR) 10 MG tablet Take 10 mg every evening by mouth.   Yes [provider]  estradiol (ESTRACE) 1 MG tablet Take 1 mg by mouth daily.   Yes [provider]  hydroxypropyl methylcellulose / hypromellose (ISOPTO TEARS / GONIOVISC) 2.5 % ophthalmic solution Place 1 drop as needed into both eyes for dry eyes.   Yes [provider]  naproxen sodium (ANAPROX) 220 MG tablet Take 220 mg daily as  needed by mouth (for pain).    Yes [provider]  omeprazole (PRILOSEC) 20 MG capsule Take 1 capsule (20 mg total) by mouth daily. 03/08/16  Yes Dorie Rank, MD  traZODone (DESYREL) 50 MG tablet Take 0.5 tablets (25 mg total) by mouth at bedtime. 08/26/16  Yes Ward Givens, NP  diclofenac sodium (VOLTAREN) 1 % GEL Apply 2 g topically 4 (four) times daily. Patient not taking: Reported on 03/18/2017 06/15/16   Leandrew Koyanagi, MD  fluticasone [facility]) 50 MCG/ACT nasal spray Place into both nostrils daily.    [provider]  meloxicam (MOBIC) 7.5 MG tablet Take 1 tablet (7.5 mg total) by mouth 2 (two) times daily as needed for pain. Patient not taking: Reported on 03/18/2017 06/01/16   Leandrew Koyanagi, MD  meloxicam (MOBIC) 7.5 MG tablet Take 1 tablet (7.5 mg total) by mouth 2 (two) times daily as needed for pain. Patient not taking: Reported on 03/18/2017 02/15/17   Leandrew Koyanagi, MD  naproxen (NAPROSYN) 500 MG tablet Take 1 tablet (500 mg total) by mouth 2 (two) times daily. Patient not taking: Reported on 03/18/2017 03/08/16   Dorie Rank, MD    Family History History reviewed. No pertinent family history.  Social History Social History   Tobacco Use  . Smoking status: Never Smoker  . Smokeless tobacco: Never Used  Substance Use Topics  . Alcohol use: Yes    Alcohol/week: 0.0 oz    Comment: occasionally  . Drug use: No     Allergies   Patient has no known allergies.   Review of Systems Review of Systems  Cardiovascular: Positive for chest pain.   All other systems reviewed and are negative except that which was mentioned in HPI   Physical Exam Updated Vital Signs BP 116/68   Pulse 65   Temp 98 F (36.7 C) (Oral)   Resp 16   Ht 5\' 5"  (1.651 m)   Wt 76.2 kg (168 lb)   SpO2 100%   BMI 27.96 kg/m   Physical Exam  Constitutional: She is oriented to person, place, and time. She appears well-developed and well-nourished. No distress.  HENT:  Head:  Normocephalic and atraumatic.  Moist mucous membranes  Eyes: Conjunctivae are normal. Pupils are equal, round, and reactive to light.  Neck: Neck supple.  Cardiovascular: Normal rate, regular rhythm and normal heart sounds.  No murmur heard. Pulmonary/Chest: Effort normal and breath sounds normal.  Abdominal: Soft. Bowel sounds are normal. She exhibits no distension. There is no tenderness.  Musculoskeletal: She exhibits no edema.  Neurological: She is alert and oriented to person, place, and time.  Fluent speech  Skin: Skin is warm and dry.  Psychiatric: She has a normal mood and affect. Judgment normal.  Nursing note and vitals reviewed.    ED Treatments / Results  Labs (all labs ordered are listed, but only abnormal results are displayed) Labs Reviewed  D-DIMER, QUANTITATIVE (NOT AT [facility]) - Abnormal; Notable for the following components:      Result Value   D-Dimer, Quant 0.69 (*)    All other components within normal limits  BASIC METABOLIC PANEL  CBC  I-STAT TROPONIN, ED  I-STAT TROPONIN, ED    EKG  EKG Interpretation  Date/Time:  Thursday March 18 2017 17:31:49 EST Ventricular Rate:  63 PR Interval:  184 QRS Duration: 70 QT Interval:  406 QTC Calculation: 415 R Axis:   60 Text Interpretation:  Normal sinus rhythm Normal ECG T wave flattening on previous tracing has resolved Confirmed by Theotis Burrow [id number]) on 03/18/2017 6:35:32 PM       Radiology Dg Chest 2 View  Result Date: 03/18/2017 CLINICAL DATA:  Chest pain. EXAM: CHEST  2 VIEW COMPARISON:  Chest radiograph 03/08/2016 FINDINGS: Normal mediastinum and cardiac silhouette. Normal pulmonary vasculature. No evidence of effusion, infiltrate, or pneumothorax. No acute bony abnormality. IMPRESSION: No acute cardiopulmonary process. Electronically Signed   By: Suzy Bouchard M.D.   On: 03/18/2017 18:15   Ct Angio Chest Pe W/cm &/or Wo Cm  Result Date: 03/18/2017 CLINICAL DATA:  Right-sided chest pain  with congestion EXAM: CT ANGIOGRAPHY CHEST WITH CONTRAST TECHNIQUE: Multidetector CT imaging of the chest was performed using the standard protocol during bolus administration of intravenous contrast. Multiplanar CT image reconstructions and MIPs were obtained to evaluate the vascular anatomy. CONTRAST:  159mL ISOVUE-370 IOPAMIDOL (ISOVUE-370) INJECTION 76% COMPARISON:  03/18/2017, 03/08/2016 FINDINGS: Cardiovascular: Satisfactory opacification of the pulmonary arteries to the segmental level. No evidence of pulmonary embolism. Nonaneurysmal aorta. Heart size within normal limits. Reflux of contrast into the hepatic veins suggesting elevated right heart pressure. Mediastinum/Nodes: No enlarged mediastinal, hilar, or axillary lymph nodes. Midline trachea. Esophagus within normal limits. Re- demonstrated hypodense nodule in the thyroid measuring 2.9 cm. In the left lobe of thyroid. Lungs/Pleura: Lungs are clear. No pleural effusion or pneumothorax. Upper Abdomen: No acute abnormality. Musculoskeletal: Degenerative changes. No acute or suspicious bone lesion Review of the MIP images confirms the above findings. IMPRESSION: 1. Negative for acute pulmonary embolism.  Clear lung fields 2. Re- demonstrated hypodense nodule in the left lobe of thyroid measuring 2.9 cm, slight increase in size. Suggest correlation with nonemergent thyroid ultrasound Electronically Signed   By: Donavan Foil M.D.   On: 03/18/2017 23:18    Procedures Procedures (including critical care time)  Medications Ordered in ED Medications  iopamidol (ISOVUE-370) 76 % injection (100 mLs  Contrast Given 03/18/17 2243)     Initial Impression / Assessment and Plan / ED Course  I have reviewed the triage vital signs and the nursing notes.  Pertinent labs & imaging results that were available during my care of the patient were reviewed by me and considered in my medical decision making (see chart for details).     PT w/ 2d nonexertional  chest pain.  She was well-appearing on exam with normal vital signs.  EKG reassuring without any evidence of ischemia.  Chest x-ray negative acute.  Obtain labs including serial troponins and d-dimer given history of estrogen use.  D-dimer slightly elevated therefore obtained a CTA of chest which was negative for PE.  She does have a thyroid nodule  and I have explained to her this finding and the need for outpatient thyroid ultrasound. Her HEART score is 2 and her description of symptoms is extremely atypical for ACS. I feel she is safe for discharge w/ PCP f/u.  Discussed this plan and return precautions and patient discharged in satisfactory condition.  Final Clinical Impressions(s) / ED Diagnoses   Final diagnoses:  Atypical chest pain  Thyroid nodule    ED Discharge Orders    None       Brittney Mucha, Wenda Overland, MD 03/19/17 [phone]

## 2017-03-19 ENCOUNTER — Telehealth: Payer: Self-pay

## 2017-03-19 NOTE — Telephone Encounter (Signed)
This can be related to armodafinil/ modafinil.  I would recommend to stop taking it.

## 2017-03-19 NOTE — Telephone Encounter (Signed)
I called pt, had an extended conversation with her. She reports starting armodafinil on Monday, taking one whole tablet as prescribed. She began experiencing some chest pain, nausea and dizziness. She denies drinking caffeine, but does endorse taking plenty of water. She did not take any armodafinil on Tuesday. On Wednesday, she took 1/2 tablet in the morning, and on Thursday morning took 1/2 tablet. She went to the ER yesterday for lingering chest pain and it was ruled out cardiac etiology for her chest pain and was told to follow up with her PCP. Pt is calling our office again to discuss the chest pain and feels it could be related to the armodafinil. Pt has not contacted her PCP. I encouraged pt to also reach out to her PCP for advice on this. Pt is asking for Dr. Brett Fairy to call her regarding these issues.  Pt is also asking if it is safe to take cold medications with menthol and eucalyptus while taking armodafinil. She was cautioned to not take the armodafinil with alcohol and is wondering if cold medication will interact with the armodafinil. Pt has not taken the armodafinil today.

## 2017-03-19 NOTE — Addendum Note (Signed)
Addended by: Belinda Block A on: 03/19/2017 10:18 AM   Modules accepted: Orders

## 2017-03-19 NOTE — Telephone Encounter (Signed)
I spoke with patient and she is aware to stop taking the armodafinil. She has reached out to her PCP and will follow their recommendations.

## 2017-03-19 NOTE — Discharge Instructions (Signed)
FOLLOW UP WITH YOUR PRIMARY CARE PROVIDER REGARDING YOUR SYMPTOMS. YOU ALSO NEED AN OUTPATIENT THYROID ULTRASOUND FOR YOUR THYROID NODULE. RETURN TO ER IF YOU HAVE ANY SEVERE WORSENING OF YOUR SYMPTOMS.

## 2017-03-19 NOTE — Telephone Encounter (Signed)
Patient was seen in the ER yesterday for chest pains, headaches, dizziness, nausea, and is requesting to speak to Dr. Brett Fairy or her RN about her ongoing symptoms and what she is supposed to do next.

## 2017-03-22 ENCOUNTER — Other Ambulatory Visit: Payer: Self-pay | Admitting: Physician Assistant

## 2017-03-22 DIAGNOSIS — E041 Nontoxic single thyroid nodule: Secondary | ICD-10-CM

## 2017-03-29 ENCOUNTER — Ambulatory Visit
Admission: RE | Admit: 2017-03-29 | Discharge: 2017-03-29 | Disposition: A | Discharge status: Home / Self Care | Payer: BC Managed Care – PPO | Source: Ambulatory Visit | Attending: Physician Assistant | Admitting: Physician Assistant

## 2017-03-29 DIAGNOSIS — E041 Nontoxic single thyroid nodule: Secondary | ICD-10-CM

## 2017-04-29 ENCOUNTER — Other Ambulatory Visit (HOSPITAL_COMMUNITY): Payer: Self-pay | Admitting: Neurosurgery

## 2017-04-29 DIAGNOSIS — M5441 Lumbago with sciatica, right side: Secondary | ICD-10-CM

## 2017-04-30 ENCOUNTER — Other Ambulatory Visit: Payer: Self-pay | Admitting: Neurosurgery

## 2017-05-03 ENCOUNTER — Ambulatory Visit (HOSPITAL_COMMUNITY)
Admission: RE | Admit: 2017-05-03 | Discharge: 2017-05-03 | Disposition: A | Discharge status: Home / Self Care | Payer: BC Managed Care – PPO | Source: Ambulatory Visit | Attending: Neurosurgery | Admitting: Neurosurgery

## 2017-05-03 DIAGNOSIS — M5441 Lumbago with sciatica, right side: Secondary | ICD-10-CM

## 2017-05-03 DIAGNOSIS — M5137 Other intervertebral disc degeneration, lumbosacral region: Secondary | ICD-10-CM | POA: Diagnosis not present

## 2017-05-03 DIAGNOSIS — M4317 Spondylolisthesis, lumbosacral region: Secondary | ICD-10-CM | POA: Insufficient documentation

## 2017-05-03 MED ORDER — DIAZEPAM 5 MG PO TABS
ORAL_TABLET | ORAL | Status: AC
  Filled 2017-05-03: qty 2

## 2017-05-03 MED ORDER — OXYCODONE HCL 5 MG PO TABS: 5.0000 mg | ORAL_TABLET | ORAL | Status: DC | PRN

## 2017-05-03 MED ORDER — OXYCODONE HCL 5 MG PO TABS
ORAL_TABLET | ORAL | Status: AC
Start: 2017-05-03 — End: 2017-05-03
  Administered 2017-05-03: 5 mg
  Filled 2017-05-03: qty 1

## 2017-05-03 MED ORDER — IOPAMIDOL (ISOVUE-M 200) INJECTION 41%
INTRAMUSCULAR | Status: AC
  Administered 2017-05-03: 20 mL via INTRATHECAL
  Filled 2017-05-03: qty 10

## 2017-05-03 MED ORDER — IOPAMIDOL (ISOVUE-M 200) INJECTION 41%
20.0000 mL | Freq: Once | INTRAMUSCULAR | Status: AC
  Administered 2017-05-03: 20 mL via INTRATHECAL

## 2017-05-03 MED ORDER — DIAZEPAM 5 MG PO TABS
10.0000 mg | ORAL_TABLET | Freq: Once | ORAL | Status: AC
  Administered 2017-05-03: 10 mg via ORAL
  Filled 2017-05-03: qty 2

## 2017-05-03 MED ORDER — ONDANSETRON HCL 4 MG/2ML IJ SOLN: 4.0000 mg | Freq: Four times a day (QID) | INTRAMUSCULAR | Status: DC | PRN

## 2017-05-03 MED ORDER — LIDOCAINE HCL (PF) 1 % IJ SOLN
5.0000 mL | Freq: Once | INTRAMUSCULAR | Status: AC
  Administered 2017-05-03: 5 mL via INTRADERMAL

## 2017-05-03 MED ORDER — LIDOCAINE HCL (PF) 1 % IJ SOLN
INTRAMUSCULAR | Status: AC
  Administered 2017-05-03: 5 mL via INTRADERMAL
  Filled 2017-05-03: qty 5

## 2017-05-03 NOTE — Op Note (Signed)
05/03/2017 Lumbar Myelogram  PATIENT:  Amber Riley is a 56 y.o. female with pain in the lower extremities, especially the right side  PRE-OPERATIVE DIAGNOSIS:  Lumbago with radiculopathy right  POST-OPERATIVE DIAGNOSIS:  same  PROCEDURE:  Lumbar Myelogram  SURGEON:  Sammie Schermerhorn  ANESTHESIA:   local LOCAL MEDICATIONS USED:  LIDOCAINE  and Amount: 7 ml Procedure Note: Saul Dorsi is a 56 y.o. female Was taken to the fluoroscopy suite and  positioned prone on the fluoroscopy table. Her back was prepared and draped in a sterile manner. I infiltrated 7 cc lidocaine into the lumbar region. I then introduced a spinal needle into the thecal sac at the L3/4 interlaminar space. I infiltrated 20cc of Isovue 200 into the thecal sac. Fluoroscopy showed the needle and contrast in the thecal sac. Ayannah Barrick tolerated the procedure well. she Will be taken to CT for evaluation.     PATIENT DISPOSITION:  Short Stay

## 2017-05-03 NOTE — Discharge Instructions (Signed)
Myelogram and Lumbar Puncture Discharge Instructions  1. Go home and rest quietly for the next 24 hours.  It is important to lie flat for the next 24 hours.  Get up only to go to the restroom.  You may lie in the bed or on a couch on your back, your stomach, your left side or your right side.  You may have one pillow under your head.  You may have pillows between your knees while you are on your side or under your knees while you are on your back.  2. DO NOT drive today.  Recline the seat as far back as it will go, while still wearing your seat belt, on the way home.  3. You may get up to go to the bathroom as needed.  You may sit up for 10 minutes to eat.  You may resume your normal diet and medications unless otherwise indicated.  4. The incidence of headache, nausea, or vomiting is about 5% (one in 20 patients).  If you develop a headache, lie flat and drink plenty of fluids until the headache goes away.  Caffeinated beverages may be helpful.  If you develop severe nausea and vomiting or a headache that does not go away with flat bed rest, call your physician.  5. You may resume normal activities after your 24 hours of bed rest is over; however, do not exert yourself strongly or do any heavy lifting tomorrow.  6. Call your physician for a follow-up appointment.  The results of your myelogram will be sent directly to your physician by the following day.  Discharge instructions have been explained to the patient.  The patient, or the person responsible for the patient, fully understands these instructions.    Moderate Conscious Sedation, Adult, Care After These instructions provide you with information about caring for yourself after your procedure. Your health care provider may also give you more specific instructions. Your treatment has been planned according to current medical practices, but problems sometimes occur. Call your health care provider if you have any problems or questions after  your procedure. What can I expect after the procedure? After your procedure, it is common:  To feel sleepy for several hours.  To feel clumsy and have poor balance for several hours.  To have poor judgment for several hours.  To vomit if you eat too soon.  Follow these instructions at home: For at least 24 hours after the procedure:   Do not: ? Participate in activities where you could fall or become injured. ? Drive. ? Use heavy machinery. ? Drink alcohol. ? Take sleeping pills or medicines that cause drowsiness. ? Make important decisions or sign legal documents. ? Take care of children on your own.  Rest. Eating and drinking  Follow the diet recommended by your health care provider.  If you vomit: ? Drink water, juice, or soup when you can drink without vomiting. ? Make sure you have little or no nausea before eating solid foods. General instructions  Have a responsible adult stay with you until you are awake and alert.  Take over-the-counter and prescription medicines only as told by your health care provider.  If you smoke, do not smoke without supervision.  Keep all follow-up visits as told by your health care provider. This is important. Contact a health care provider if:  You keep feeling nauseous or you keep vomiting.  You feel light-headed.  You develop a rash.  You have a fever. Get help right  away if:  You have trouble breathing. This information is not intended to replace advice given to you by your health care provider. Make sure you discuss any questions you have with your health care provider. Document Released: 02/08/2013 Document Revised: 09/23/2015 Document Reviewed: 08/10/2015 Elsevier Interactive Patient Education  [employer].

## 2017-05-07 ENCOUNTER — Ambulatory Visit (HOSPITAL_COMMUNITY): Payer: BC Managed Care – PPO

## 2017-10-21 ENCOUNTER — Other Ambulatory Visit: Payer: Self-pay | Admitting: Physician Assistant

## 2017-10-21 DIAGNOSIS — Z1231 Encounter for screening mammogram for malignant neoplasm of breast: Secondary | ICD-10-CM

## 2017-11-02 ENCOUNTER — Other Ambulatory Visit: Payer: Self-pay | Admitting: Physician Assistant

## 2017-11-02 DIAGNOSIS — N644 Mastodynia: Secondary | ICD-10-CM

## 2017-11-08 ENCOUNTER — Other Ambulatory Visit: Payer: BC Managed Care – PPO

## 2017-11-09 ENCOUNTER — Ambulatory Visit: Payer: BC Managed Care – PPO

## 2017-11-09 ENCOUNTER — Ambulatory Visit
Admission: RE | Admit: 2017-11-09 | Discharge: 2017-11-09 | Disposition: A | Discharge status: Home / Self Care | Payer: BC Managed Care – PPO | Source: Ambulatory Visit | Attending: Physician Assistant | Admitting: Physician Assistant

## 2017-11-09 DIAGNOSIS — N644 Mastodynia: Secondary | ICD-10-CM

## 2017-11-11 IMAGING — CT CT ANGIO CHEST
2 of 6 series · 18 of 36 positions shown · IV contrast (Omni 300)
Comparison: Chest radiograph from earlier today.

CLINICAL DATA: Right chest pain.

EXAM:
CT ANGIOGRAPHY CHEST WITH CONTRAST
TECHNIQUE: Multidetector CT imaging of the chest was performed using the
standard protocol during bolus administration of intravenous
contrast. Multiplanar CT image reconstructions and MIPs were
obtained to evaluate the vascular anatomy.
CONTRAST:  100 cc Isovue 370 IV.

[Series 6: pe thins · axial · 0.66mm/px · z∈[+1145,+1400]mm · 17 of 287 slices shown]
[im 16/287  lung]
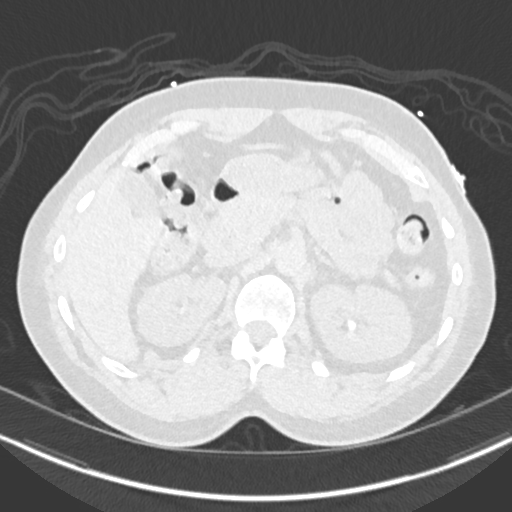
[im 32/287  mediastinal]
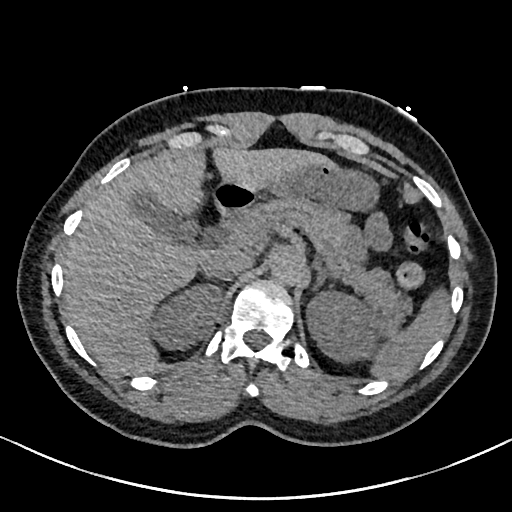
[im 48/287  lung]
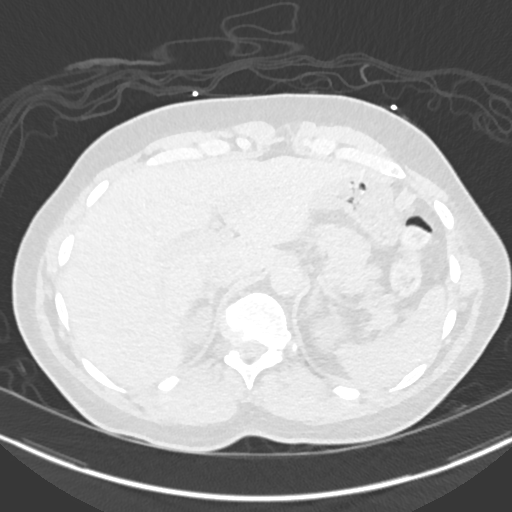
[im 64/287  mediastinal]
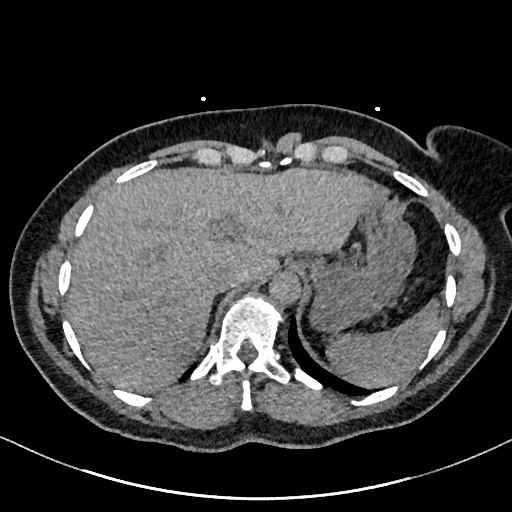
[im 80/287  lung]
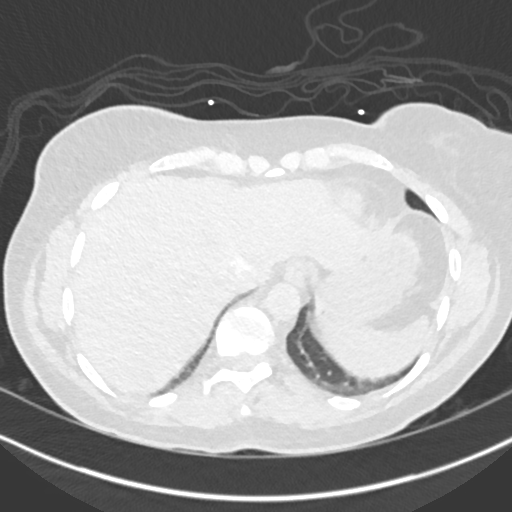
[im 96/287  mediastinal]
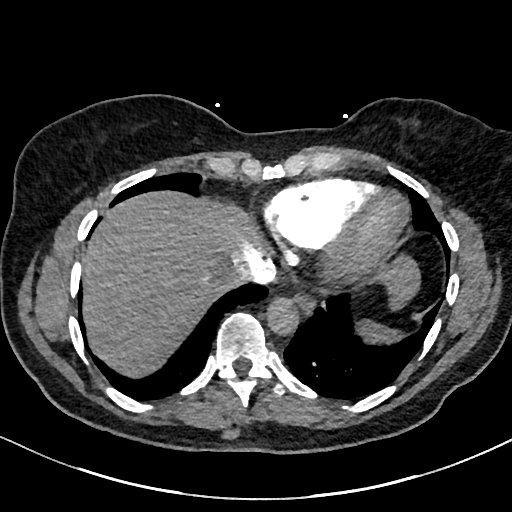
[im 112/287  lung]
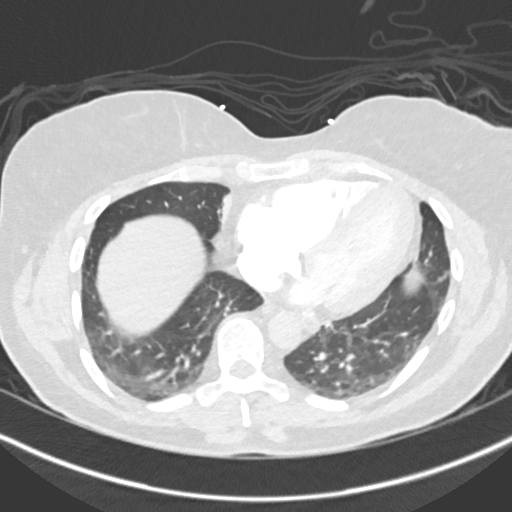
[im 128/287  mediastinal]
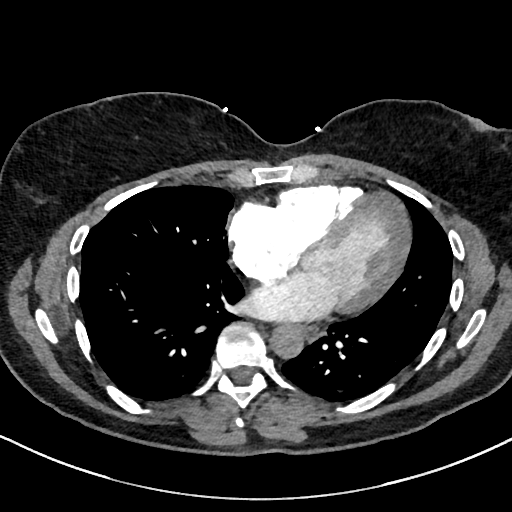
[im 144/287  lung]
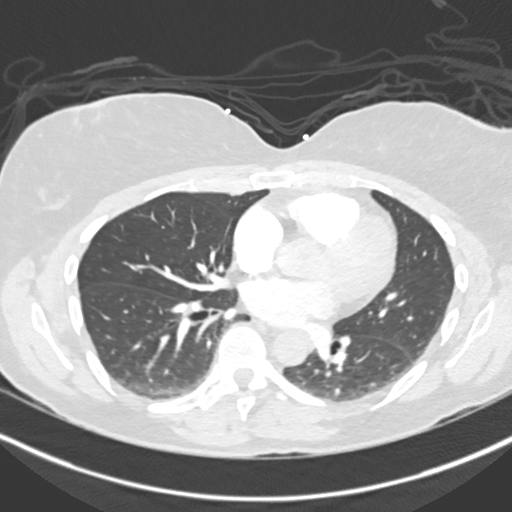
[im 159/287  mediastinal]
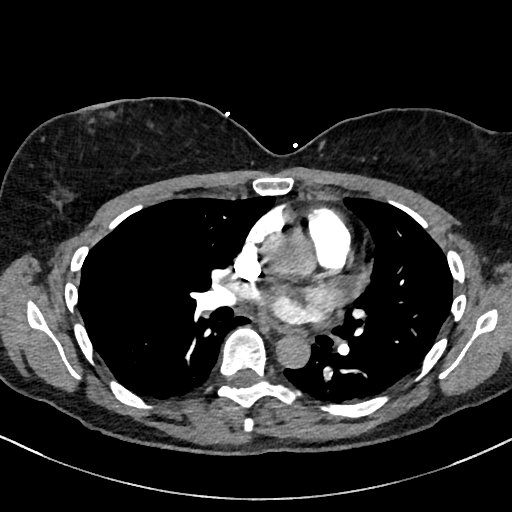
[im 175/287  lung]
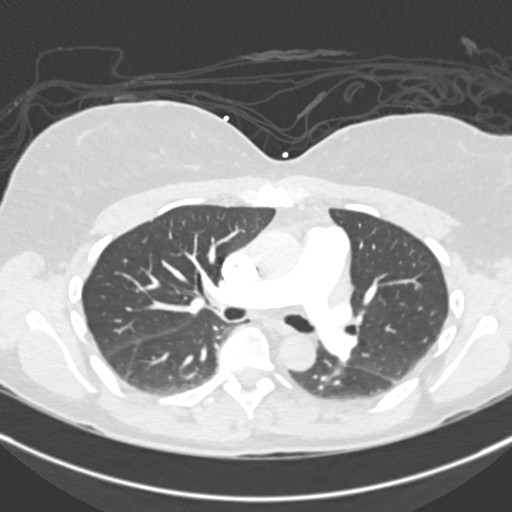
[im 191/287  mediastinal]
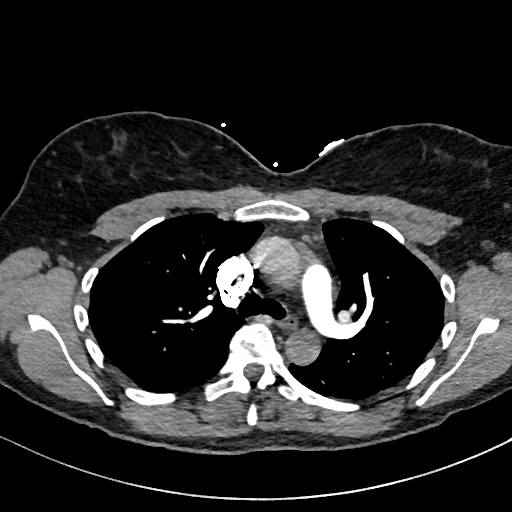
[im 207/287  lung]
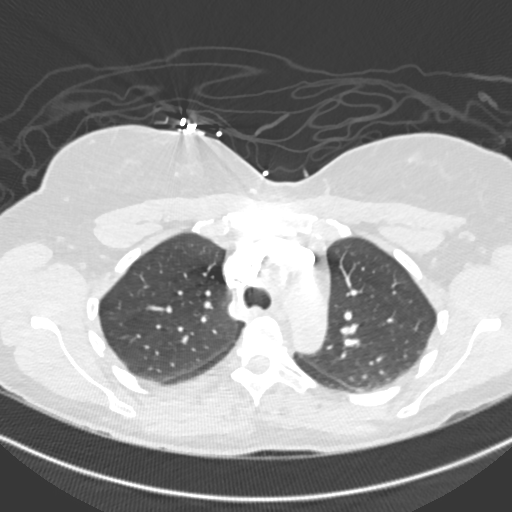
[im 223/287  mediastinal]
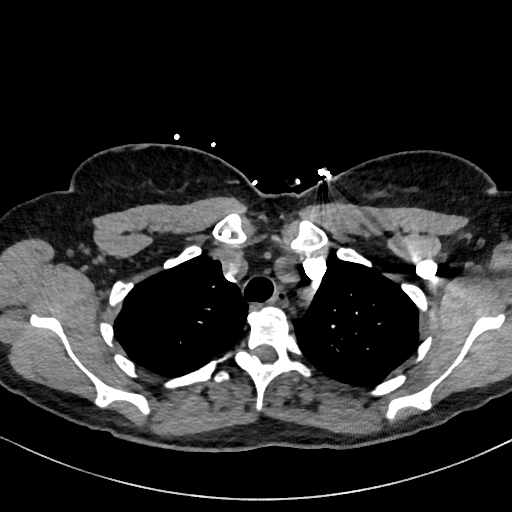
[im 239/287  lung]
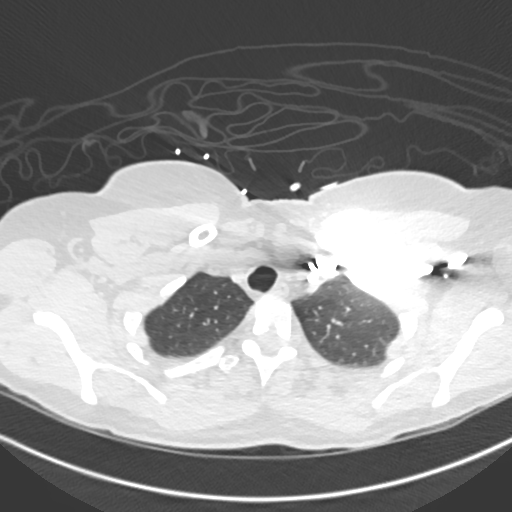
[im 255/287  mediastinal]
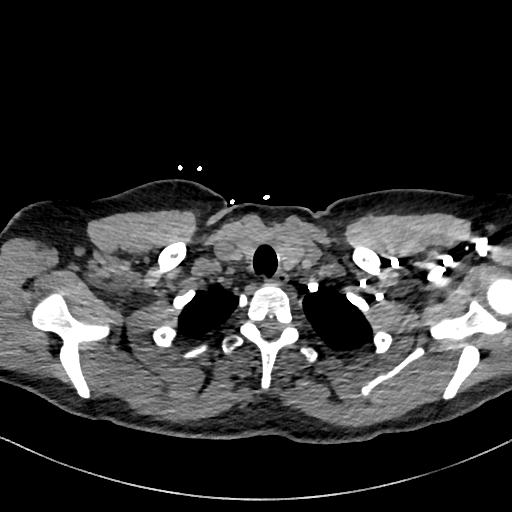
[im 271/287  lung]
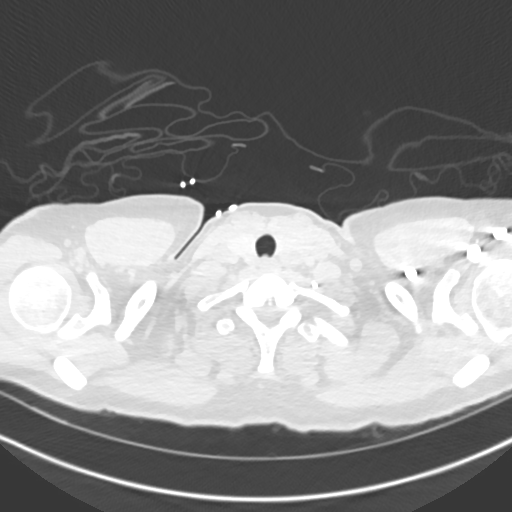

[Series 7: pe 2mm cor · coronal · 0.59mm/px · 1 of 143 slices shown]
[im 72/143  mediastinal]
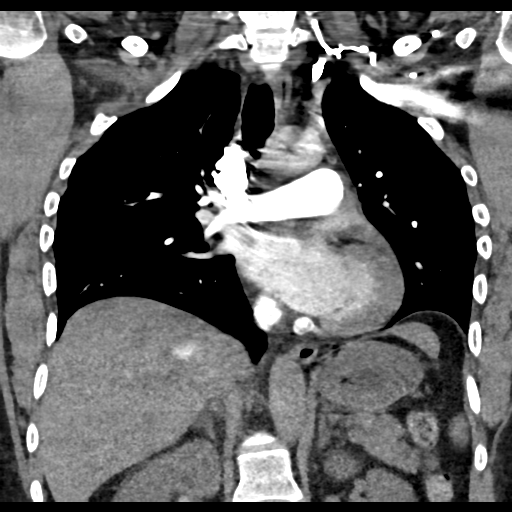

[18 of 36 positions shown; findings below may reference images not displayed]

FINDINGS: Cardiovascular: The study is high quality for the evaluation of
pulmonary embolism. There are no filling defects in the central,
lobar, segmental or subsegmental pulmonary artery branches to
suggest acute pulmonary embolism. Great vessels are normal in course
and caliber. Normal heart size. No significant pericardial
fluid/thickening.

Mediastinum/Nodes: Dominant 2.1 cm slightly hypodense left thyroid
lobe nodule. Unremarkable esophagus. No pathologically enlarged
axillary, mediastinal or hilar lymph nodes.

Lungs/Pleura: No pneumothorax. No pleural effusion. Mild
hypoventilatory changes in the dependent lower lobes. No acute
consolidative airspace disease, lung masses or significant pulmonary
nodules in the aerated portions of the lungs.

Upper abdomen: Unremarkable.

Musculoskeletal: No aggressive appearing focal osseous lesions. Mild
thoracic spondylosis.

Review of the MIP images confirms the above findings.
IMPRESSION: 1. No pulmonary embolism.  No active pulmonary disease.
2. Dominant 2.1 cm left thyroid lobe nodule, for which correlation
with thyroid ultrasound is recommended on a short term outpatient
basis. This follows ACR consensus guidelines: Managing Incidental
Thyroid Nodules Detected on Imaging: White Paper of [REDACTED]. [HOSPITAL] 6333;

## 2018-01-04 ENCOUNTER — Ambulatory Visit
Admission: RE | Admit: 2018-01-04 | Discharge: 2018-01-04 | Disposition: A | Discharge status: Home / Self Care | Payer: BC Managed Care – PPO | Source: Ambulatory Visit | Attending: Physician Assistant | Admitting: Physician Assistant

## 2018-01-04 DIAGNOSIS — Z1231 Encounter for screening mammogram for malignant neoplasm of breast: Secondary | ICD-10-CM

## 2018-03-07 ENCOUNTER — Ambulatory Visit: Payer: BC Managed Care – PPO | Admitting: Neurology

## 2018-03-07 ENCOUNTER — Encounter: Payer: Self-pay | Admitting: Neurology

## 2018-03-07 VITALS — BP 123/65 | HR 90 | Ht 65.0 in | Wt 177.0 lb

## 2018-03-07 DIAGNOSIS — F32A Depression, unspecified: Secondary | ICD-10-CM

## 2018-03-07 DIAGNOSIS — Z79899 Other long term (current) drug therapy: Secondary | ICD-10-CM

## 2018-03-07 DIAGNOSIS — F5103 Paradoxical insomnia: Secondary | ICD-10-CM

## 2018-03-07 DIAGNOSIS — G47419 Narcolepsy without cataplexy: Secondary | ICD-10-CM

## 2018-03-07 DIAGNOSIS — R4589 Other symptoms and signs involving emotional state: Secondary | ICD-10-CM

## 2018-03-07 DIAGNOSIS — F4321 Adjustment disorder with depressed mood: Secondary | ICD-10-CM

## 2018-03-07 DIAGNOSIS — F419 Anxiety disorder, unspecified: Secondary | ICD-10-CM

## 2018-03-07 DIAGNOSIS — G479 Sleep disorder, unspecified: Secondary | ICD-10-CM

## 2018-03-07 DIAGNOSIS — G4719 Other hypersomnia: Secondary | ICD-10-CM | POA: Insufficient documentation

## 2018-03-07 DIAGNOSIS — F329 Major depressive disorder, single episode, unspecified: Secondary | ICD-10-CM

## 2018-03-07 HISTORY — DX: Other hypersomnia: G47.19

## 2018-03-07 HISTORY — DX: Depression, unspecified: F32.A

## 2018-03-07 HISTORY — DX: Sleep disorder, unspecified: G47.9

## 2018-03-07 HISTORY — DX: Other symptoms and signs involving emotional state: R45.89

## 2018-03-07 HISTORY — DX: Paradoxical insomnia: F51.03

## 2018-03-07 NOTE — Patient Instructions (Signed)
Hypersomnia Hypersomnia is when you feel extremely tired during the day even though you're getting plenty of sleep at night. You may need to take naps during the day, and you may also be extremely difficult to wake up when you are sleeping. What are the causes? The cause of your hypersomnia may not be known. Hypersomnia may be caused by:  Medicines.  Sleep disorders, such as narcolepsy.  Trauma or injury to your head or nervous system.  Using drugs or alcohol.  Tumors.  Medical conditions, such as depression or hypothyroidism.  Genetics.  What are the signs or symptoms? The main symptoms of hypersomnia include:  Feeling extremely tired throughout the day.  Being very difficult to wake up.  Sleeping for longer and longer periods.  Taking naps throughout the day.  Other symptoms may include:  Feeling: ? Restless. ? Annoyed. ? Anxious. ? Low energy.  Having difficulty: ? Remembering. ? Speaking. ? Thinking.  Losing your appetite.  Experiencing hallucinations.  How is this diagnosed? Hypersomnia may be diagnosed by:  Medical history and physical exam. This will include a sleep history.  Completing sleep logs.  Tests may also be done, such as: ? Polysomnography. ? Multiple sleep latency test (MSLT).  How is this treated? There is no cure for hypersomnia, but treatment can be very effective in helping manage the condition. Treatment may include:  Lifestyle and sleeping strategies to help cope with the condition.  Stimulant medicines.  Treating any underlying causes of hypersomnia.  Follow these instructions at home:  Take medicines only as directed by your health care provider.  Schedule short naps for when you feel sleepiest during the day. Tell your employer or teachers that you have hypersomnia. You may be able to adjust your schedule to include time for naps.  Avoid drinking alcohol or caffeinated beverages.  Do not eat a heavy meal before  bedtime. Eat at about the same times every day.  Do not drive or operate heavy machinery if you are sleepy.  Do not swim or go out on the water without a life jacket.  If possible, adjust your schedule so that you do not have to work or be active at night.  Keep all follow-up visits as directed by your health care provider. This is important. Contact a health care provider if:  You have new symptoms.  Your symptoms get worse. Get help right away if: You have serious thoughts of hurting yourself or someone else. This information is not intended to replace advice given to you by your health care provider. Make sure you discuss any questions you have with your health care provider. Document Released: 04/10/2002 Document Revised: 09/26/2015 Document Reviewed: 11/23/2013 Elsevier Interactive Patient Education  2018 [employer]  

## 2018-03-07 NOTE — Progress Notes (Signed)
PATIENT: Amber Riley DOB: [birth year]  REASON FOR VISIT: follow up- narcolepsy HISTORY FROM: patient here alone   " You can't find anything wrong with me "   Interval history form 03-07-2018 , for Amber Riley , a 57 year old AAF with long standing hypersomnia, reflecting possibly narcolepsy. She has not had the necessary sleep tests on Epic, her paper records not available any longer. s She has a sleep test at [facility]-  And was told she had narcolepsy then.  She has difficulties to stay awake , but also some problems with insomnia, her brain starts racing. I am very unsure of her diagnosis.  She is a poor candidate for Narcolepsy treatment with XYREM, as she lives alone. Provigil and Nuvigil have not helped that uch, one made her heart "jump" and another made her "high and still fatigued".  She didn't like that feeling. She is now investigating if there is a newer medication-  And I  found no PSG and MSLT on record. Will do an HLA test and I will order PSG and MSLT today.     Interval history from 05 March 2017, I have the pleasure of seeing Amber Riley today, and a 57 year old African-American patient with a history of poorly controlled narcolepsy.  She continues to have irresistible urge to sleep and daytime, struggles to stay awake but her nocturnal sleep has slightly improved since she takes trazodone.  She still has symptoms of significant depression which are common in narcoleptic patients.  She endorsed today the Epworth sleepiness score of 15 points of the fatigue severity score at 50/ 63 points .  She felt no benefit from Provigil- it did not help with work place alertness. 8 years ago she was briefly on XYREM- it is not clear that she ever titrated to optimum dose.  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 She doesn't want to return to [facility] " its salty, nasty and I have me a cocktail and can't take it" . She is willing to try Nuvigil- concerned about feeling  grumpy or agitated on it.  There may be more depression than can be addressed on Trazodone. I have assured her she can stop taking it if she feels it has such effect on her.    HISTORY OF PRESENT ILLNESS: Amber Riley, Amber Riley is a 57 year old with a history of narcolepsy. She returns today for follow-up. She is no longer on Provigil as she did not find it beneficial. She states that she is currently taking trazodone however she cannot tolerate the whole tablet therefore she has been cutting the tablets into fourths. She reports that she does not take it every night because there are some nights that she does have to get up between 12 and 2 AM to pick up someone. She states that she typically goes to bed around 10 and her alarm goes off at 6:15. She states that she does try to take a nap at lunchtime and then after work. She does notice some day she feels very sad and often irritated. She denies any new neurological symptoms reported stay for an evaluation.  HISTORY copied from AmberNyla Riley's notes: Amber Riley is a 57 y.o. female , seen here as a referral  from Amber Riley / Amber Riley, Amber Riley for Narcolepsy treatment.  Amber Riley reports that she was diagnosed with narcolepsy on 11/09/2005 by Amber Riley. Her referring physician at the time was Amber Riley. She had endorsed the  Epworth sleepiness score at 20 out of 24 points had a BMI of 26.5 and a normal polysomnography without evidence of apnea. Overall AHI was 3.9. She slept all night in supine position. REM AHI was 7.5. She had 21% of REM sleep of total sleep time. REM latency was 86 minutes. The study was followed by an M SLT. She was allowed 5 naps in one of those she could not go to sleep but the others had a sleep latency of less than than 1.5 minutes. REM sleep latency was 10 minutes and 1 and 4 minutes and another nap. 3 naps had no REM sleep onset. 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 She was on Xyrem to 3-1/2 month, but has not been on Xyrem for almost 7 years. She remembers that she was not fully titrated at the time that she had to refrain from this therapy.  Further medical history positive for multinodular goiter, depression, migraine headaches, respiratory allergies,  Chief complaint according to patient : Excessive daytime sleepiness  Sleep habits are as follows: She usually goes to bed between 11 and 11:30 and does not have trouble to fall asleep, but cannot stay asleep. She often falls asleep on the sofa before going to the bedroom. Usually when watching TV. Her bedroom however is cool, quiet and dark and she is you have it usually going over to the bedroom by 1 AM.  Most nights she sleeps through, until 5 AM.  Sometimes a bed partner may snore and wake her, some nights she has a bathroom break but not every night. Her average sleep time at night is not sufficient at only 6 hours or less. She sleeps usually on her side but during the night will roll over and sleeps supine. She is usually using one or 2 pillows for neck support.Headaches are present many mornings, she feels neither refreshed nor restored.  She works from [employer] and tries to get a nap in at lunch, but these are neither refreshing or restorative. Once she arrives home after work she has to take a nap.  Sleep medical history and family sleep history:  Hypersomnia, father- had ESRD. Brother has insomnia.Niece on maternal site has narcolepsy.   Social history:  Works 8 to 5 PM, he does not drink caffeine containing sodas, ice tea nor coffee. She does drink alcohol about 2 a week, she does not use tobacco  REVIEW OF SYSTEMS: Out of a  complete 14 system review of symptoms, the patient complains only of the following symptoms, and all other reviewed systems are negative. Seasonal depression, mind is racing - not followed by a psychiatric specialist.  Epworth Sleepiness Score 17/ 24  FSS 50/ 63  No cataplexy, Back pain, muscle cramps, neck pain, agitation, confusion, depression, nervous/anxious, daytime sleepiness, constipation, frequency of urination, dizziness, unexpected weight change, ringing in ears, chest pain  ALLERGIES: No Known Allergies  HOME MEDICATIONS: Outpatient Medications Prior to Visit  Medication Sig Dispense Refill  . atorvastatin (LIPITOR) 10 MG tablet Take 10 mg every evening by mouth.    . diclofenac sodium (VOLTAREN) 1 % GEL Apply 2 g topically 4 (four) times daily. 1 Tube 5  .  estradiol (ESTRACE) 1 MG tablet Take 1 mg by mouth daily.    . fluticasone (FLONASE) 50 MCG/ACT nasal spray Place 2 sprays into both nostrils daily.     . hydroxypropyl methylcellulose / hypromellose (ISOPTO TEARS / GONIOVISC) 2.5 % ophthalmic solution Place 1 drop as needed into both eyes for dry eyes.    . naproxen sodium (ANAPROX) 220 MG tablet Take 220 mg daily as needed by mouth (for pain).     Marland Kitchen omeprazole (PRILOSEC) 20 MG capsule Take 1 capsule (20 mg total) by mouth daily. (Patient taking differently: Take 20 mg by mouth daily as needed (heartburn). ) 14 capsule 0  . Probiotic Product (PROBIOTIC PO) Take 1 tablet by mouth 2 (two) times daily. Patient is in a research trial.    . meloxicam (MOBIC) 7.5 MG tablet Take 1 tablet (7.5 mg total) by mouth 2 (two) times daily as needed for pain. (Patient not taking: Reported on 03/18/2017) 30 tablet 2  . meloxicam (MOBIC) 7.5 MG tablet Take 1 tablet (7.5 mg total) by mouth 2 (two) times daily as needed for pain. (Patient not taking: Reported on 03/18/2017) 30 tablet 2  . naproxen (NAPROSYN) 500 MG tablet Take 1 tablet (500 mg total) by mouth 2 (two) times daily. (Patient not  taking: Reported on 03/18/2017) 30 tablet 0  . naproxen sodium (ALEVE) 220 MG tablet Take 220 mg by mouth daily as needed (general pain).    . traZODone (DESYREL) 50 MG tablet Take 0.5 tablets (25 mg total) by mouth at bedtime. 30 tablet 5   No facility-administered medications prior to visit.     PAST MEDICAL HISTORY: Past Medical History:  Diagnosis Date  . Anemia    hx of-was on iron pills yrs ago  . Chronic back pain    DDD/scoliosis  . Constipation    takes OTC stool softener,laxatives,or enema  . Depression    not on any meds  . GERD (gastroesophageal reflux disease)    occasionally but no meds  . History of migraine    last one a week ago  . History of panic attacks    doesn't take meds  . Hyperlipidemia    was on meds but has been off for yrs-told much improved  . Migraine   . Muscle spasm    takes Flexeril daily as needed  . 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 PONV (postoperative nausea and vomiting)   . Seasonal allergies    takes Claritin daily  . UTI (lower urinary tract infection)    treated in May with antibiotic  . Weakness    numbness and tingling down right left    PAST SURGICAL HISTORY: Past Surgical History:  Procedure Laterality Date  . ABDOMINAL HYSTERECTOMY    . BILATERAL FOOT SURGERY    . COLONOSCOPY    . EYE SURGERY Left   . knot removed from left side of neck    . MYOMECTOMY     to remove fibroids    FAMILY HISTORY: Family History  Problem Relation Age of Onset  . Breast cancer Mother 63    SOCIAL HISTORY: patient quit coffee, no energy drinks. She still drinks alcohol.  Social History   Socioeconomic History  . Marital status: Divorced    Spouse name: Not on file  . Number of children: Not on file  . Years of education: Not on file  . Highest education level: Not on file  Occupational  History  . Not on file  Social Needs  . Financial resource strain: Not on file  . Food insecurity:     Worry: Not on file    Inability: Not on file  . Transportation needs:    Medical: Not on file    Non-medical: Not on file  Tobacco Use  . Smoking status: Never Smoker  . Smokeless tobacco: Never Used  Substance and Sexual Activity  . Alcohol use: Yes    Alcohol/week: 0.0 standard drinks    Comment: occasionally  . Drug use: No  . Sexual activity: Yes    Birth control/protection: Surgical  Lifestyle  . Physical activity:    Days per week: Not on file    Minutes per session: Not on file  . Stress: Not on file  Relationships  . Social connections:    Talks on phone: Not on file    Gets together: Not on file    Attends religious service: Not on file    Active member of club or organization: Not on file    Attends meetings of clubs or organizations: Not on file    Relationship status: Not on file  . Intimate partner violence:    Fear of current or ex partner: Not on file    Emotionally abused: Not on file    Physically abused: Not on file    Forced sexual activity: Not on file  Other Topics Concern  . Not on file  Social History Narrative  . Not on file      PHYSICAL EXAM  Vitals:   03/07/18 1032  BP: 123/65  Pulse: 90  Weight: 177 lb (80.3 kg)  Height: 5' 5"  (1.651 m)   Body mass index is 29.45 kg/m.  Generalized: Well developed, in no acute distress- pressured speech. patient was asleep as I entered the room. She is not a fluent historian, she reports insomnia, feeling stressed, "brain is racing - mind is racing "  Neurological examination  Mentation: Alert oriented to time, place, history taking. Her  speech and language fluent Cranial nerve : no taste changes, no changes in smell. Pupils round and reactive to light.Uvula tongue midline. Head turning and shoulder shrug  were normal and symmetric. Motor:  5/  5 strength of all 4 extremities,symmetric bulk and motor tone is noted throughout.   finger-nose-finger intact bilaterally.   DIAGNOSTIC DATA (LABS,  IMAGING, TESTING) - I reviewed patient records, labs, notes, testing and imaging myself where available. No new lab- HLA needed - If negative , no follow up in sleep clinic needed.     ASSESSMENT AND PLAN 57 y.o. year old female  has a past medical history of Anemia, Chronic back pain, Constipation, Depression, GERD (gastroesophageal reflux disease), History of migraine, History of panic attacks, Hyperlipidemia, Migraine, Muscle spasm, Narcolepsy, PONV (postoperative nausea and vomiting), Seasonal allergies, UTI (lower urinary tract infection), and Weakness. here with:  1. 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 .   2. Depressed mood, panic and anxiety- contributing to insomnia. Needs to be followed by psychiatry/ psychology.   I had a long discussion with the patient in regards to her sleep hygiene. Advised that she should avoid sleep interruptions. I encouraged patient to try to avoid getting up in the middle the night to go pick someone up.  I had encouraged the patient to try to take trazodone nightly as this may be beneficial  for her sleep and depression. She has not taking it.  She continues to havesleepiness that she feels is intolerable and wants  a medication to address daytime stimulation, but all medication have failed her , over and over. Marland Kitchen   NUVIGIL ( armodafinil ) - 250 mg was last offer  - she developed a manic exacerbation.   HLA test for narcolepsy - if negative will return to PCP.       Larey Seat, MD  03/07/2018, 10:59 AM [facility] [address], [place], [place] [address] [phone]  [facility] ([facility])

## 2018-03-15 LAB — NARCOLEPSY EVALUATION
DQA1*01:02: POSITIVE
DQB1*06:02: POSITIVE

## 2018-03-16 ENCOUNTER — Telehealth: Payer: Self-pay | Admitting: Neurology

## 2018-03-16 NOTE — Telephone Encounter (Signed)
-----   Message from Larey Seat, MD sent at 03/16/2018 11:21 AM EST ----- Positive  for 2 alleles .

## 2018-03-16 NOTE — Telephone Encounter (Signed)
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 Informed the patient that this didn't necessarily mean she carries the diagnosis just means that she carries the gene. Advised the patient that the sleep test that she is scheduled for in December will determine that. Educated again to the patient to make sure that she doesn't do any illicit drugs, or medications that could hinder the testing such as pain medication, stimulant medication or antidepressant. Reviewed her med list with her which appeared to be ok. Pt verbalized understanding.

## 2018-04-06 ENCOUNTER — Ambulatory Visit (INDEPENDENT_AMBULATORY_CARE_PROVIDER_SITE_OTHER): Payer: BC Managed Care – PPO | Admitting: Neurology

## 2018-04-06 DIAGNOSIS — G4710 Hypersomnia, unspecified: Secondary | ICD-10-CM | POA: Diagnosis not present

## 2018-04-06 DIAGNOSIS — G479 Sleep disorder, unspecified: Secondary | ICD-10-CM

## 2018-04-06 DIAGNOSIS — F32A Depression, unspecified: Secondary | ICD-10-CM

## 2018-04-06 DIAGNOSIS — R4589 Other symptoms and signs involving emotional state: Secondary | ICD-10-CM

## 2018-04-06 DIAGNOSIS — F329 Major depressive disorder, single episode, unspecified: Secondary | ICD-10-CM

## 2018-04-06 DIAGNOSIS — G4719 Other hypersomnia: Secondary | ICD-10-CM

## 2018-04-06 DIAGNOSIS — F4321 Adjustment disorder with depressed mood: Secondary | ICD-10-CM

## 2018-04-06 DIAGNOSIS — F5103 Paradoxical insomnia: Secondary | ICD-10-CM

## 2018-04-06 DIAGNOSIS — F419 Anxiety disorder, unspecified: Secondary | ICD-10-CM

## 2018-04-07 ENCOUNTER — Ambulatory Visit (INDEPENDENT_AMBULATORY_CARE_PROVIDER_SITE_OTHER): Payer: BC Managed Care – PPO | Admitting: Neurology

## 2018-04-07 ENCOUNTER — Other Ambulatory Visit: Payer: Self-pay | Admitting: Neurology

## 2018-04-07 DIAGNOSIS — G479 Sleep disorder, unspecified: Secondary | ICD-10-CM

## 2018-04-07 DIAGNOSIS — F32A Depression, unspecified: Secondary | ICD-10-CM

## 2018-04-07 DIAGNOSIS — F5103 Paradoxical insomnia: Secondary | ICD-10-CM

## 2018-04-07 DIAGNOSIS — G47419 Narcolepsy without cataplexy: Secondary | ICD-10-CM

## 2018-04-07 DIAGNOSIS — F329 Major depressive disorder, single episode, unspecified: Secondary | ICD-10-CM

## 2018-04-07 DIAGNOSIS — R4589 Other symptoms and signs involving emotional state: Secondary | ICD-10-CM

## 2018-04-07 DIAGNOSIS — Z79899 Other long term (current) drug therapy: Secondary | ICD-10-CM

## 2018-04-07 DIAGNOSIS — G4719 Other hypersomnia: Secondary | ICD-10-CM

## 2018-04-07 DIAGNOSIS — F4321 Adjustment disorder with depressed mood: Secondary | ICD-10-CM

## 2018-04-07 DIAGNOSIS — F419 Anxiety disorder, unspecified: Secondary | ICD-10-CM

## 2018-04-07 NOTE — Addendum Note (Signed)
Addended by: Inis Sizer D on: 04/07/2018 03:32 PM   Modules accepted: Orders

## 2018-04-12 LAB — COMPREHENSIVE DRUG ANALYSIS,UR

## 2018-04-14 NOTE — Procedures (Signed)
  Name:  Amber Riley, Amber Riley Reference [id number]  Study Date: 04/07/2018 Procedure #: [id number]  DOB: 04/07/2019    Protocol  This is a 13 channel Multiple Sleep Latency Test comprised of 5 channels of EEG (T3-Cz, Cz-T4, F4-M1, C4-M1, O2-M1), 3 channels of Chin EMG, 4 channels of EOG and 1 channel for ECG.   All channels were sampled at 256hz .    This polysomnographic procedure is designed to evaluate (1) the complaint of excessive daytime sleepiness by quantifying the time required to fall asleep and (2) the possibility of narcolepsy by checking for abnormally short latencies to REM sleep.  Electrographic variables include EEG, EMG, EOG and ECG.  Patients are monitored throughout four or five 20-minute opportunities to sleep (naps) at two-hour intervals.  For each nap, the patient is allowed 20 minutes to fall asleep.  Once asleep, the patient is awakened after 15 minutes.  Between naps, the patient is kept as alert as possible.  A sleep latency of 20 minutes indicates that no sleep occurred.  Parametric Analysis  Total Number of Naps 5     NAP # Time of Nap  Sleep Latency (mins) REM Latency (mins) Sleep Time Percent Awake Time Percent  1 07:18 12.5 0 57 43   2 09:17 2.5 0 86  14   3 11:07 1 0 95  5   4 13:04 3 0 85  15   5 14:59 1.5 6 93  7    MSLT Summary of Naps  Sleepiness Index: 79.5  Mean Sleep Latency to all Five Naps: 4.1  Mean Sleep Latency to First Four Naps: 4.8  Mean Sleep Latency to First Three Naps: 5.3  Mean Sleep Latency to First Two Naps: 7.5  Number of Naps with REM Sleep: 1    Results from Preceding PSG Study  Sleep Onset Time 21:53 Sleep Efficiency (%) 80.6%  Rise Time 05:27 Sleep Latency (min) 41 min  Total Sleep Time  6.7 hours REM Latency (min) 32 min         Name:  Amber Riley, Amber Riley Reference #:  [id number]  Study Date: 04/07/2018 DOB: 04/07/2019    IMPRESSION:  1. This multiple sleep latency test reveals a mean sleep latency of 4.1 minutes  with 1 sleep periods during which REM sleep was recorded.   2. A total of 5 sleep periods were recorded.   3. This study was preceded by an overnight polysomnogram with a total sleep time (TST) of 399.5 minutes.        RECOMMENDATIONS:  This MSLT study is abnormal, the study is consistent with a pathological degree of  hypersomnolence. This study may be consistent with a diagnosis of narcolepsy, based on 1 SREM in nap study and short REM latency in PSG .    I attest to having reviewed every epoch of the entire raw data recording prior to the issuance of this report in accordance with the Standards of the [place] of Sleep Medicine.      Larey Seat, M.D.  04-14-2018  Diplomat, American Board of Psychiatry and Neurology  Diplomat, [place] of Sleep Medicine Market researcher, [place] [facility] at [facility]

## 2018-04-14 NOTE — Addendum Note (Signed)
Addended by: Larey Seat on: 04/14/2018 04:16 PM   Modules accepted: Orders

## 2018-04-14 NOTE — Procedures (Signed)
PATIENT'S NAME:  Amber Riley, Amber Riley DOB:      04/07/2019      MR#:    [id number]     DATE OF RECORDING: 04/06/2018 REFERRING M.D.:   Study Performed:   Baseline Polysomnogram HISTORY:  Visit from 03-07-2018, for Mrs. 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 She has not had the necessary sleep tests since implementation of EPIC, no data available on Epic, her paper records not available any longer. She has a sleep test at [facility]- and reportedly was told she had narcolepsy then.  She has difficulties to stay awake, but also some problems with insomnia, her brain starts racing.  I am very unsure of her diagnosis.  She is a poor candidate for Narcolepsy treatment with XYREM, as she lives alone. Provigil and Nuvigil have not helped that much, one made her heart "jump" and the other made her "high, moody, euphoric, and still fatigued".  She didn't like that feeling. She is now investigating if there is a newer medication-  And as I have found no PSG and MSLT on record will need to do an HLA test and I will order PSG and MSLT today.    The patient endorsed the Epworth Sleepiness Scale at 17/ 24 points, FSS at 58/63 points.   The patient's weight 177 pounds with a height of 65 (inches), resulting in a BMI of 29.45 kg/m2. The patient's neck circumference measured 15 inches.  CURRENT MEDICATIONS: Lipitor, Voltaren, Estrace, Flonase, Anaprox, Prilosec, Mobic, Naprosyn, Aleve, Desyrel   PROCEDURE:  This is a multichannel digital polysomnogram utilizing the Somnostar 11.2 system.  Electrodes and sensors were applied and monitored per AASM Specifications.   EEG, EOG, Chin and Limb EMG, were sampled at 200 Hz.  ECG, Snore and Nasal Pressure, Thermal Airflow, Respiratory Effort, CPAP Flow and Pressure, Oximetry was sampled at 50 Hz. Digital video and audio were recorded.      BASELINE STUDY: Lights Out was at 21:12 and Lights On at 05:27.  Total recording  time (TRT) was 495.5 minutes, with a total sleep time (TST) of 399.5 minutes.   The patient's sleep latency was 43 minutes.  REM latency was 32.5 minutes.  The sleep efficiency was 80.6 %.     SLEEP ARCHITECTURE: WASO (Wake after sleep onset) was 54.5 minutes.  There were 18 minutes in Stage N1, 116.5 minutes Stage N2, 129.5 minutes Stage N3 and 135.5 minutes in Stage REM.  The percentage of Stage N1 was 4.5%, Stage N2 was 29.2%, Stage N3 was 32.4% and Stage R (REM sleep) was 33.9%.   RESPIRATORY ANALYSIS:  There were a total of 0 respiratory events:     The total APNEA/HYPOPNEA INDEX (AHI) was 0 /hour .The patient spent 160 minutes of total sleep time in the supine position and 240 minutes in non-supine.  OXYGEN SATURATION & C02:  The Wake baseline 02 saturation was 96%, with the lowest being 32%. Time spent below 89% saturation equaled 2 minutes.    PERIODIC LIMB MOVEMENTS:  The patient had a total of 0 Periodic Limb Movements. The arousals were noted as: 57 were spontaneous, 0 were associated with PLMs, and 0 were associated with respiratory events.  Audio and video analysis did not show any abnormal or unusual movements, behaviors, phonations or vocalizations.  The patient had a high proportion of REM sleep.  EKG was in keeping with normal sinus rhythm (NSR).   IMPRESSION:  Normal sleep study, valid for MSLT to  follow.  TST was 399.5 minutes. REM latency was 32 minutes.    Larey Seat, MD  04-14-2018 Diplomat, American Board of Psychiatry and Neurology  Diplomat, [place] of Sleep Medicine Market researcher, [employer] [facility] at [employer]

## 2018-04-19 ENCOUNTER — Other Ambulatory Visit: Payer: Self-pay | Admitting: Neurology

## 2018-04-19 ENCOUNTER — Telehealth: Payer: Self-pay | Admitting: Neurology

## 2018-04-19 ENCOUNTER — Ambulatory Visit (INDEPENDENT_AMBULATORY_CARE_PROVIDER_SITE_OTHER): Payer: Self-pay | Admitting: Neurology

## 2018-04-19 DIAGNOSIS — G4719 Other hypersomnia: Secondary | ICD-10-CM

## 2018-04-19 DIAGNOSIS — Z0289 Encounter for other administrative examinations: Secondary | ICD-10-CM

## 2018-04-19 DIAGNOSIS — G47419 Narcolepsy without cataplexy: Secondary | ICD-10-CM

## 2018-04-19 MED ORDER — SOLRIAMFETOL HCL 75 MG PO TABS: 75.0000 mg | ORAL_TABLET | ORAL | 0 refills | Status: DC

## 2018-04-19 MED ORDER — SOLRIAMFETOL HCL 75 MG PO TABS: 75.0000 mg | ORAL_TABLET | Freq: Every day | ORAL | 5 refills | Status: DC

## 2018-04-19 NOTE — Progress Notes (Signed)
Patient is presenting to pick up sunosi 75 mg samples for 2 wks. I have reviewed side effects with the patient as well as discussed the use of the medication. I have sent the script and will work on the PA for the medication. Pt verbalized understanding.Pt had no questions at this time but was encouraged to call back if questions arise.

## 2018-04-19 NOTE — Telephone Encounter (Signed)
-----  Message from Larey Seat, MD sent at 04/14/2018  4:16 PM EST ----- Unfortunately not a clear result- this patient has definitely no sleep apnea, and she is very sleepy, given the mean sleep latency was 4.1 minutes. But only one naps had REM onset, the preceding night study had high REM proportion and latency after 32 minutes.  I lean to wait for there tox screen and HLA test before making a  narcolepsy diagnosis.    cc Noelle Redmon.

## 2018-04-19 NOTE — Telephone Encounter (Signed)
Called the patient and reviewed the the sleep study results. Informed her that the study was consistent was hypersomonolence. Given the patient did have 1 REM onset in a nao and mean sleep latensy was 4.1 min. Her HLA test was also positive in both strands Dr. Brett Fairy would consider this a narcolepsy diagnosis. Given her trial and fail of modafinil and armodafinil as well as Xyrem, Dr Brett Fairy is recommending the patient try Sunosi a new medication for narcolepsy. She will start out on the 75 mg dose and see how well the patient can tolerate the medication. I reviewed side effects of the medication and the medication in general with the patient. Will provide the patient with 2 wks of samples and also send the order in to the pharmacy to initiate the Pa process. Pt verbalized understanding.

## 2018-05-02 ENCOUNTER — Telehealth: Payer: Self-pay | Admitting: Neurology

## 2018-05-02 NOTE — Telephone Encounter (Signed)
PA submitted through cover my meds [facility] for the patient for sunosi 75 mg.  UIQ:[id number]. Should hear within 24 hours.

## 2018-05-02 NOTE — Telephone Encounter (Signed)
PA approved through [facility] 05/02/18-05/03/2019 [place] [phone]

## 2018-05-09 ENCOUNTER — Telehealth: Payer: Self-pay

## 2018-05-09 NOTE — Telephone Encounter (Signed)
Pt has called requesting a call back to discuss her sleep medications

## 2018-05-10 NOTE — Telephone Encounter (Signed)
Called the patient back and she describes that she started the medication samples that we had gave her and states that she would take the full 75 mg tablet and found that she would have a headache. She mentioned that she also had some anxiousness that she found. I advised that she has tried and failed 3 different narcolepsy medications. She has a history of migraines and headaches anyways and she hasn't had the medication for sure at least 4 days and she states that she also woke up with a headache today she isnt 100% sure that the medication is the cause behind the headaches but she does note having the headache every time she took the medication. Pt also states that she honestly cant tell us if its treating her narcolepsy. She states she still finds that in afternoon she needs a nap. Advised the patient that her body may need to get used to the medication and for her to attempt given the medication a solid try. Advised the patient that she could possibly work her way up to the 75 mg. Attempt taking half tablet in am for a couple doses. Then if she finds she is able to tolerate the medication at the half dose potentially she could attempt 1/2 tablet in am and then the other half tab at lunch/early afternoon time to help with the afternoon hump. Pt states she is willing to try this and will call us and keep [place] posted on how well it is working.

## 2018-05-27 ENCOUNTER — Other Ambulatory Visit: Payer: Self-pay | Admitting: Physician Assistant

## 2018-05-27 DIAGNOSIS — G43909 Migraine, unspecified, not intractable, without status migrainosus: Secondary | ICD-10-CM

## 2018-06-01 ENCOUNTER — Ambulatory Visit
Admission: RE | Admit: 2018-06-01 | Discharge: 2018-06-01 | Disposition: A | Discharge status: Home / Self Care | Payer: BC Managed Care – PPO | Source: Ambulatory Visit | Attending: Physician Assistant | Admitting: Physician Assistant

## 2018-06-01 DIAGNOSIS — G43909 Migraine, unspecified, not intractable, without status migrainosus: Secondary | ICD-10-CM

## 2018-06-13 ENCOUNTER — Encounter (HOSPITAL_COMMUNITY): Payer: Self-pay | Admitting: Emergency Medicine

## 2018-06-13 ENCOUNTER — Emergency Department (HOSPITAL_COMMUNITY)
Admission: EM | Admit: 2018-06-13 | Discharge: 2018-06-13 | Disposition: A | Discharge status: Home / Self Care | Payer: BC Managed Care – PPO | Attending: Emergency Medicine | Admitting: Emergency Medicine

## 2018-06-13 DIAGNOSIS — Y999 Unspecified external cause status: Secondary | ICD-10-CM | POA: Diagnosis not present

## 2018-06-13 DIAGNOSIS — Y9389 Activity, other specified: Secondary | ICD-10-CM | POA: Diagnosis not present

## 2018-06-13 DIAGNOSIS — S7012XA Contusion of left thigh, initial encounter: Secondary | ICD-10-CM | POA: Insufficient documentation

## 2018-06-13 DIAGNOSIS — Z79899 Other long term (current) drug therapy: Secondary | ICD-10-CM | POA: Diagnosis not present

## 2018-06-13 DIAGNOSIS — T71193A Asphyxiation due to mechanical threat to breathing due to other causes, assault, initial encounter: Secondary | ICD-10-CM | POA: Diagnosis not present

## 2018-06-13 DIAGNOSIS — Y92009 Unspecified place in unspecified non-institutional (private) residence as the place of occurrence of the external cause: Secondary | ICD-10-CM | POA: Diagnosis not present

## 2018-06-13 DIAGNOSIS — S199XXA Unspecified injury of neck, initial encounter: Secondary | ICD-10-CM | POA: Diagnosis present

## 2018-06-13 NOTE — Discharge Instructions (Signed)
1.  If you continue to have pain with swallowing or difficulty breathing you will need to be seen by an ear nose throat specialist.  The number for Dr. Valetta Mole has been put in your discharge instructions. 2.  If you are getting worsening difficulty with breathing or swallowing, return to the emergency department.
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
## 2018-06-13 NOTE — ED Notes (Signed)
Patient verbalizes understanding of discharge instructions. Opportunity for questioning and answers were provided. Armband removed by staff, pt discharged from ED ambulatory.   

## 2018-08-12 ENCOUNTER — Telehealth: Payer: Self-pay | Admitting: Neurology

## 2018-08-12 NOTE — Telephone Encounter (Signed)
Pt is calling wanting to know if she can be prescribed something to anxiety and depression, pt hasnt reached out to PCP

## 2018-08-15 NOTE — Telephone Encounter (Signed)
Appt made in webex.

## 2018-08-15 NOTE — Telephone Encounter (Addendum)
I returned the call to the patient.  She is going to reach out to her PCP to discuss having an evaluation for her depression and anxiety.  While on the phone, she informed me that she has been taking Sunsosi 75mg , one tablet daily and it has only been mildly helpful with her daytime wakefulness.  She reports going to bed each night around 12-12:30am then getting up and taking her medication between 8-9:30am.  She tends to take a 15 minute nap daily at 5:30ish.  She would like an appt to discuss her medication regimen. She has given consent for a virtual visit with Debbora Presto, NP at 9:30am on 08/16/2018.  Her email address is: [email or web address]

## 2018-08-16 ENCOUNTER — Other Ambulatory Visit: Payer: Self-pay

## 2018-08-16 ENCOUNTER — Ambulatory Visit (INDEPENDENT_AMBULATORY_CARE_PROVIDER_SITE_OTHER): Payer: BC Managed Care – PPO | Admitting: Family Medicine

## 2018-08-16 ENCOUNTER — Encounter: Payer: Self-pay | Admitting: Neurology

## 2018-08-16 ENCOUNTER — Encounter: Payer: Self-pay | Admitting: Family Medicine

## 2018-08-16 DIAGNOSIS — G47419 Narcolepsy without cataplexy: Secondary | ICD-10-CM

## 2018-08-16 HISTORY — DX: Narcolepsy without cataplexy: G47.419

## 2018-08-16 MED ORDER — SOLRIAMFETOL HCL 75 MG PO TABS: ORAL_TABLET | ORAL | 5 refills | Status: DC

## 2018-08-16 NOTE — Addendum Note (Signed)
Addended by: Brandon Melnick on: 08/16/2018 09:11 AM   Modules accepted: Orders

## 2018-08-16 NOTE — Telephone Encounter (Signed)
LMVM for pt to return call re: updating chart.  Home # NW#.  LMVM on mobile.
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
## 2018-08-17 ENCOUNTER — Other Ambulatory Visit: Payer: Self-pay | Admitting: Family Medicine

## 2018-08-17 ENCOUNTER — Telehealth: Payer: Self-pay | Admitting: Family Medicine

## 2018-08-17 MED ORDER — SOLRIAMFETOL HCL 75 MG PO TABS: ORAL_TABLET | ORAL | 5 refills | Status: DC

## 2018-08-17 NOTE — Telephone Encounter (Signed)
Please let her know that prescription was resent to pharmacy. I apologize that the last order was set to print instead of electronic delivery. Let me know if there are any issues.

## 2018-08-17 NOTE — Telephone Encounter (Signed)
Pt called in and stated her script for Solriamfetol HCl (SUNOSI) 75 MG TABS wasn't at her pharmacy and she stated they needed it to be sent back over

## 2018-09-13 DIAGNOSIS — H9313 Tinnitus, bilateral: Secondary | ICD-10-CM

## 2018-09-13 HISTORY — DX: Tinnitus, bilateral: H93.13

## 2018-10-25 ENCOUNTER — Other Ambulatory Visit: Payer: Self-pay | Admitting: Internal Medicine

## 2018-10-25 DIAGNOSIS — Z20822 Contact with and (suspected) exposure to covid-19: Secondary | ICD-10-CM

## 2018-10-28 LAB — NOVEL CORONAVIRUS, NAA: SARS-CoV-2, NAA: NOT DETECTED

## 2018-11-11 ENCOUNTER — Telehealth: Payer: Self-pay | Admitting: Family Medicine

## 2018-11-11 NOTE — Telephone Encounter (Signed)
Pt is asking for a call from RN to discuss her concerns with Solriamfetol HCl (SUNOSI) 75 MG TABS

## 2018-11-14 MED ORDER — METHYLPHENIDATE HCL 10 MG PO TABS: ORAL_TABLET | ORAL | 0 refills | Status: AC

## 2018-11-14 NOTE — Addendum Note (Signed)
Addended by: Brandon Melnick on: 11/14/2018 04:39 PM   Modules accepted: Orders

## 2018-11-14 NOTE — Telephone Encounter (Signed)
There is modagFnil and Sunosi. There is adderall or ritalin for those without HTN and without heart condition.  She will need to try low dose ritalin if these conditions are not present.

## 2018-11-14 NOTE — Telephone Encounter (Signed)
LMVM for pt to return call.   

## 2018-11-14 NOTE — Telephone Encounter (Signed)
Not a part of SUNOSI_  I believe that is caused by something else.

## 2018-11-14 NOTE — Addendum Note (Signed)
Addended by: Larey Seat on: 11/14/2018 02:26 PM   Modules accepted: Orders

## 2018-11-14 NOTE — Telephone Encounter (Signed)
She was asking about another medication to try since the sunosi is not working for her.
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
## 2018-11-14 NOTE — Telephone Encounter (Signed)
I called pt and she does not have HTN or heart problems.  NEW CHANGE:  She did then say that she was on cymbalta for depression  then changed to [place], but now on effexor 75mg  ER po qhs for depression.  Ok to take with ritalin low dose?  I relayed that pharmacy to go over SE, drug interactions and contraindications.

## 2018-11-16 NOTE — Telephone Encounter (Signed)
Pt called in and stated the pharmacy sent over a PA for Ritalin and wants to know if it was received.

## 2018-11-17 NOTE — Telephone Encounter (Signed)
PA approved through [facility] for the patient for the medication. 11/17/2018-11/16/2021

## 2018-11-17 NOTE — Telephone Encounter (Signed)
PA completed through cover my meds/[facility] for the patient for methylphenidate 10 mg. KEY: : [id number] Can take 24 hrs before getting a response

## 2018-11-21 ENCOUNTER — Other Ambulatory Visit: Payer: Self-pay

## 2018-11-21 ENCOUNTER — Ambulatory Visit (INDEPENDENT_AMBULATORY_CARE_PROVIDER_SITE_OTHER): Payer: BC Managed Care – PPO | Admitting: Family Medicine

## 2018-11-21 DIAGNOSIS — G47419 Narcolepsy without cataplexy: Secondary | ICD-10-CM
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
## 2018-11-22 ENCOUNTER — Encounter: Payer: Self-pay | Admitting: Family Medicine

## 2018-11-24 ENCOUNTER — Telehealth: Payer: Self-pay | Admitting: Family Medicine

## 2018-11-24 NOTE — Telephone Encounter (Signed)
Please have her stop medication. Jitteriness is a common side effect. She can restart Sunosi if she wishes. If not, she will need follow up with Dr Brett Fairy to decide on alternative treatment options as she has failed Provigil, Nuvugil and Sunosi.

## 2018-11-24 NOTE — Telephone Encounter (Signed)
I spoke to pt .  She will stop the ritalin and take sunosi over weekend and let us know what she wants to do next week.
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
## 2018-11-24 NOTE — Telephone Encounter (Signed)
Pt called in and stated she has been having a reaction to methylphenidate (RITALIN) 10 MG tablet

## 2019-01-02 ENCOUNTER — Telehealth: Payer: Self-pay

## 2019-01-02 ENCOUNTER — Ambulatory Visit: Payer: BC Managed Care – PPO | Admitting: Family Medicine

## 2019-01-02 ENCOUNTER — Encounter: Payer: Self-pay | Admitting: Adult Health
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
## 2019-01-02 NOTE — Telephone Encounter (Signed)
Patient was a no call/no show for their appointment today.   

## 2019-02-08 ENCOUNTER — Other Ambulatory Visit: Payer: Self-pay | Admitting: Physician Assistant

## 2019-02-08 DIAGNOSIS — Z1231 Encounter for screening mammogram for malignant neoplasm of breast: Secondary | ICD-10-CM

## 2019-02-20 ENCOUNTER — Telehealth: Payer: Self-pay | Admitting: Family Medicine

## 2019-02-20 NOTE — Telephone Encounter (Signed)
Pt is needing to speak to the RN about her Sonosi 75mg  Please advise.

## 2019-02-20 NOTE — Telephone Encounter (Signed)
I called pt.  She needs refill on sunosi, but questions if this may be causing the texture of her hair to be brittle, wiry feeling.  She has dry mouth with it as well.  She did relay that she had anxiety and was prescribed med for this by her pcp, and this may have been what she was feeling when she took ritalin.  She may retry this and see how she does and if of may change, but needs sunosi now at [facility] [place].  She has moved to [place]  And may change her pharmacy to [facility] in [place] in future.  Drug registry checked sunosi last fill 01/30/19 #22 (14day). Needs to pick up today.

## 2019-02-21 MED ORDER — SUNOSI 75 MG PO TABS: 1.0000 | ORAL_TABLET | Freq: Every day | ORAL | 5 refills | Status: DC

## 2019-02-21 NOTE — Telephone Encounter (Signed)
I called pt and relayed the message from AL/NP.  She asked about the ritalin.  I stated that will make appt to address her questions, she stated then about blotches on her thighs.   ? SE.  She has been on the sunosi for months.  Made appt 02-23-19 at 0930.

## 2019-02-21 NOTE — Telephone Encounter (Signed)
I have refilled medication for her. It can cause dryness. Most all of the medications used for fatigue can cause same symptoms. She needs follow up scheduled if not already.

## 2019-02-23 ENCOUNTER — Other Ambulatory Visit: Payer: Self-pay

## 2019-02-23 ENCOUNTER — Ambulatory Visit: Payer: BC Managed Care – PPO | Admitting: Family Medicine

## 2019-02-23 ENCOUNTER — Encounter: Payer: Self-pay | Admitting: Family Medicine

## 2019-02-23 VITALS — BP 108/78 | HR 80 | Temp 97.2°F | Ht 65.0 in | Wt 163.0 lb

## 2019-02-23 DIAGNOSIS — G47419 Narcolepsy without cataplexy: Secondary | ICD-10-CM | POA: Diagnosis not present

## 2019-02-23 DIAGNOSIS — F329 Major depressive disorder, single episode, unspecified: Secondary | ICD-10-CM | POA: Diagnosis not present

## 2019-02-23 DIAGNOSIS — F419 Anxiety disorder, unspecified: Secondary | ICD-10-CM | POA: Diagnosis not present

## 2019-02-23 DIAGNOSIS — F32A Depression, unspecified: Secondary | ICD-10-CM

## 2019-02-23 NOTE — Patient Instructions (Addendum)
Continue current treatment plan for now  Follow up with [place] in 6 months  Narcolepsy Narcolepsy is a neurological disorder that causes you to fall asleep suddenly, and without control, during the daytime (sleep attacks). Narcolepsy is a lifelong (chronic) disorder. Normally, sleep follows a regular cycle over the course of the night. After about 90 minutes of light sleep, your sleep should become deeper. When your sleep becomes deeper, your body moves less and you start dreaming. This type of deep sleep is called rapid eye movement (REM) sleep. When you have narcolepsy, your REM sleep is not well-regulated. This disrupts your sleep cycle, which causes daytime sleepiness. What are the causes? The cause of narcolepsy is not fully understood, but it may be related to:  Low levels of hypocretin, a chemical (neurotransmitter) in the brain that controls sleep and wake cycles. Hypocretin imbalance may be caused by: ? Abnormal genes that are passed from parent to child (inherited). ? The body's defense system (immune system) attacking hypocretin brain cells (autoimmune disease).  Infection, tumor, or injury in the area of the brain that controls sleep.  Exposure to poisons (toxins), such as heavy metals, pesticides, and secondhand smoke. What are the signs or symptoms? Symptoms of this condition include:  Excessive daytime sleepiness. This is the most common symptom and is usually the first symptom you will notice. This may affect your performance at work or school.  Sleep attacks. This means falling asleep suddenly and without control. You may fall asleep in the middle of an activity, especially low-energy activities like reading or watching TV.  Feeling like you cannot think clearly.  Trouble focusing or remembering things.  Feeling depressed.  Sudden muscle weakness (cataplexy). When this occurs, your speech may become slurred, or your knees may buckle. Cataplexy is usually triggered by  surprise, anger, fear, or laughter.  Loss of the ability to speak or move (sleep paralysis). This may occur just as you start to fall asleep or wake up. You will be aware of the paralysis. It usually lasts for just a few seconds or minutes.  Seeing, hearing, tasting, smelling, or feeling things that are not real (hallucinations). Hallucinations may occur with sleep paralysis. They can happen when you are falling asleep, waking up, or dozing.  Trouble staying asleep at night (insomnia).  Restless sleep. How is this diagnosed? This condition may be diagnosed based on:  A physical exam to rule out any other problems that may be causing your symptoms.You may be asked to write down your sleeping patterns for several weeks in a sleep diary. This will help your health care provider make a diagnosis.  Sleep studies that measure how well your REM sleep is regulated. These tests also measure your heart rate, breathing, movement, and brain waves. These tests include: ? An overnight sleep study (polysomnogram). ? A daytime sleep study that is done while you take several naps during the day (multiple sleep latency test, MSLT). This test measures how quickly you fall asleep and how quickly you enter REM sleep.  Removal of spinal fluid to measure hypocretin levels. How is this treated? There is no cure for this condition, but treatment can help relieve symptoms. Treatment may include:  Lifestyle and sleeping strategies to help you cope with the condition, such as: ? Exercising regularly. ? Maintaining a regular sleep schedule. ? Avoiding caffeine and large meals before bed.  Medicines. These may include: ? Medicines that help keep you awake and alert (stimulants) to fight daytime sleepiness. ? Medicines  that treat depression (antidepressants). These may be used to treat cataplexy. ? Sodium oxybate. This is a strong medicine to help you relax (sedative) that you may take at night. It can help control  daytime sleepiness and cataplexy. Follow these instructions at home: Sleeping habits   Get about 8 hours of sleep every night.  Go to sleep and get up at about the same time every day.  Keep your bedroom dark, quiet, and comfortable.  When you feel very tired, take short naps. Schedule naps so that you take them at about the same time every day.  Tell your employer or teachers that you have narcolepsy. You may be able to adjust your schedule to include time for naps.  Before bedtime: ? Avoid bright lights and screens. ? Relax. Try activities like reading or taking a warm bath. Activity  Get at least 20 minutes of exercise every day. This will help you sleep better at night and reduce daytime sleepiness.  Avoid exercising within 3 hours of bedtime.  If you are sleepy, do not drive or use heavy machinery.  If possible, take a nap before driving.  Do not swim or go out on the water without a life jacket. Eating and drinking  Do not drink alcohol or caffeinated beverages within 4-5 hours of bedtime.  Do not eat a lot of food before bedtime. Eat meals at about the same times every day. General instructions   Take over-the-counter and prescription medicines only as told by your health care provider.  If directed, keep a sleep diary.  Tell your employer or teachers that you have narcolepsy. You may be able to adjust your schedule to include time for naps.  Do not use any products that contain nicotine or tobacco, such as cigarettes and e-cigarettes. If you need help quitting, ask your health care provider.  Keep all follow-up visits as told by your health care provider. This is important. Contact a health care provider if:  Your symptoms are not getting better.  You have increasingly high blood pressure (hypertension).  You have changes in your heart rhythm.  You are having a hard time determining what is real and what is not (psychosis). Get help right away if:  You  hurt yourself during a sleep attack or an attack of cataplexy.  You have chest pain.  You have trouble breathing. This information is not intended to replace advice given to you by your health care provider. Make sure you discuss any questions you have with your health care provider. Document Released: 04/10/2002 Document Revised: 04/02/2017 Document Reviewed: 04/13/2016 Elsevier Patient Education  2020 [employer].
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
## 2019-03-02 ENCOUNTER — Telehealth: Payer: Self-pay | Admitting: Family Medicine

## 2019-03-02 DIAGNOSIS — G47419 Narcolepsy without cataplexy: Secondary | ICD-10-CM

## 2019-03-02 NOTE — Telephone Encounter (Signed)
Noted! Thank you

## 2019-03-02 NOTE — Telephone Encounter (Signed)
  Hello Amy,   I just spoke to patient -  Amber Riley has not worked that well at 75 mg, and she believes it causes bruising on her thighs and brittle hair.  I asked her to have PCP rule out thyroid, vit deficiency, zinc, biotin.  The ritalin has caused chest pressures, will not resume. I asked her to try 150 mg Sunosi for a week. At least we will know if her sleepiness will respond at all or not- and if not will d/c the med.  Larey Seat, MD

## 2019-03-02 NOTE — Telephone Encounter (Signed)
Pt called stating that she would like to discuss her medication with her provider. Pt did not want to offer any other information. Please advise.

## 2019-03-13 NOTE — Telephone Encounter (Signed)
Pt called back wanting to discuss to RN or provider about her SUNOSI 75 MG TABS Pt states that the pharmacy will not refill it till the 15th and she is needing them now. Please advise.

## 2019-03-13 NOTE — Telephone Encounter (Signed)
If Sunosi doesn't work for her, lets stop it. The Sunosi is unlikely to be cause of her hair changes, she needs to follow up with PCP and possibly Dermatology for this.  No difference in sleepiness- no need to take it further.

## 2019-03-13 NOTE — Telephone Encounter (Signed)
Spoke to pt and relayed that we have exhausted options for her re: her hypersomnia.  Due to side effect to all.  She may seek care with another sleep medicine practice for other options.  [facility] or [facility]. Pt was ok with this.

## 2019-03-13 NOTE — Addendum Note (Signed)
Addended by: Brandon Melnick on: 03/13/2019 05:17 PM   Modules accepted: Orders

## 2019-03-13 NOTE — Telephone Encounter (Signed)
She has been on every medication I know to use. They have all caused side effects or been ineffective. I do not have any other suggestions at this time. I am uncertain if Dr Brett Fairy knows of anything else we can try?

## 2019-03-13 NOTE — Telephone Encounter (Signed)
Pt returned call.  She has taken the sunosi, 150 daily.  She stated she took both tablet same time on fist day.  C/o dry eyes.  So from there took 1 tablet in am and another at 1200.  She states that no real difference in sleepiness. She needs something to take.  She had not seen her pcp for other possible causes for brittle hair.  I relayed that she needs to follow up with this.

## 2019-03-13 NOTE — Telephone Encounter (Signed)
Drug registry last fill 02-21-19 #30 Sunosi.

## 2019-03-13 NOTE — Telephone Encounter (Signed)
I LMVM for pt to see how she is doing on sunosi 150mg  po daily.  Prescription in epic is for 75 mg daily that is reason why most likely denying (as too soon).

## 2019-03-15 ENCOUNTER — Other Ambulatory Visit: Payer: Self-pay | Admitting: Family Medicine

## 2019-03-15 MED ORDER — SUNOSI 75 MG PO TABS: 2.0000 | ORAL_TABLET | Freq: Every day | ORAL | 2 refills | Status: DC

## 2019-03-15 NOTE — Telephone Encounter (Signed)
I am confused. I thought Sunosi was causing side effects and was not working. Is she going to continue medication?

## 2019-03-15 NOTE — Addendum Note (Signed)
Addended by: Debbora Presto L on: 03/15/2019 04:29 PM   Modules accepted: Orders

## 2019-03-15 NOTE — Telephone Encounter (Signed)
This pt wanted this to be called in for her hypersomnia.  (she stated she needs something).  She is stated she had some dry eye when I spoke to her.  As per my other note, she is ok to see other sleep center like at [facility].  Needs referral placed as well.

## 2019-03-15 NOTE — Telephone Encounter (Signed)
Pt called wanting to know when her SUNOSI 75 MG TABS will be called in. Please call back when available.

## 2019-03-16 MED ORDER — SUNOSI 75 MG PO TABS: 2.0000 | ORAL_TABLET | Freq: Every day | ORAL | 2 refills | Status: DC

## 2019-03-16 NOTE — Telephone Encounter (Signed)
[facility]

## 2019-03-16 NOTE — Addendum Note (Signed)
Addended byUbaldo Glassing, Keyry Iracheta L on: 03/16/2019 11:23 AM   Modules accepted: Orders

## 2019-03-16 NOTE — Addendum Note (Signed)
Addended by: Brandon Melnick on: 03/16/2019 04:35 PM   Modules accepted: Orders

## 2019-03-16 NOTE — Telephone Encounter (Signed)
Spoke with [facility] [address] to cancel the prescription for Sunosi.

## 2019-03-16 NOTE — Telephone Encounter (Signed)
Pt has called again wanting an update when her medication will be called in for her. Please advise.

## 2019-03-16 NOTE — Telephone Encounter (Signed)
Pt called back and stated that this prescription needs to be sent to the [facility] on [address] Please advise.

## 2019-03-16 NOTE — Addendum Note (Signed)
Addended by: Trudie Buckler on: 03/16/2019 05:26 PM   Modules accepted: Orders

## 2019-03-16 NOTE — Addendum Note (Signed)
Addended by: Brandon Melnick on: 03/16/2019 05:23 PM   Modules accepted: Orders

## 2019-03-21 ENCOUNTER — Other Ambulatory Visit: Payer: Self-pay | Admitting: Family Medicine

## 2019-03-21 DIAGNOSIS — G47419 Narcolepsy without cataplexy: Secondary | ICD-10-CM

## 2019-03-22 ENCOUNTER — Other Ambulatory Visit: Payer: Self-pay

## 2019-03-22 ENCOUNTER — Ambulatory Visit
Admission: RE | Admit: 2019-03-22 | Discharge: 2019-03-22 | Disposition: A | Discharge status: Home / Self Care | Payer: BC Managed Care – PPO | Source: Ambulatory Visit | Attending: Physician Assistant | Admitting: Physician Assistant

## 2019-03-22 DIAGNOSIS — Z1231 Encounter for screening mammogram for malignant neoplasm of breast: Secondary | ICD-10-CM

## 2019-03-24 ENCOUNTER — Ambulatory Visit: Payer: BC Managed Care – PPO

## 2019-03-27 ENCOUNTER — Ambulatory Visit: Payer: BC Managed Care – PPO

## 2019-08-10 ENCOUNTER — Ambulatory Visit: Payer: BC Managed Care – PPO | Attending: Family

## 2019-08-10 DIAGNOSIS — Z23 Encounter for immunization: Secondary | ICD-10-CM

## 2019-08-10 NOTE — Progress Notes (Signed)
   Covid-19 Vaccination Clinic  Name:  Amber Riley    MRN: [id number] DOB: June 01, 1960  08/10/2019  Amber Riley was observed post Covid-19 immunization for 15 minutes without incident. She was provided with Vaccine Information Sheet and instruction to access the V-Safe system.   Amber Riley was instructed to call 911 with any severe reactions post vaccine: Marland Kitchen Difficulty breathing  . Swelling of face and throat  . A fast heartbeat  . A bad rash all over body  . Dizziness and weakness   Immunizations Administered    Name Date Dose VIS Date Route   Moderna COVID-19 Vaccine 08/10/2019 11:22 AM 0.5 mL 04/04/2019 Intramuscular   Manufacturer: Moderna   Lot: [id number]   [id number]

## 2019-08-24 ENCOUNTER — Ambulatory Visit: Payer: BC Managed Care – PPO | Admitting: Family Medicine

## 2019-09-04 ENCOUNTER — Telehealth: Payer: Self-pay | Admitting: Family Medicine

## 2019-09-04 NOTE — Telephone Encounter (Signed)
CMM initated KEY  [id number] for sunosi 75mg  tabs 2 tabsl daily for narcolepsy w/ cataplexy. Determination pending.

## 2019-09-04 NOTE — Telephone Encounter (Signed)
1) Medication(s) Requested (by name): SUNOSI 75 MG TABS   2) Pharmacy of Choice: [facility] [place]  [place], [place] [address]  3) Special Requests: Insurance is requesting a PA

## 2019-09-05 MED ORDER — SUNOSI 150 MG PO TABS: ORAL_TABLET | ORAL | 0 refills | Status: DC

## 2019-09-05 MED ORDER — SUNOSI 150 MG PO TABS: 1.0000 | ORAL_TABLET | Freq: Every day | ORAL | 2 refills | Status: DC

## 2019-09-05 NOTE — Addendum Note (Signed)
Addended by: Brandon Melnick on: 09/05/2019 03:38 PM   Modules accepted: Orders

## 2019-09-05 NOTE — Telephone Encounter (Signed)
Spoke to pt and she is taking 75mg  (2 tabs) at once daily.  She is tolerating ok.  I relayed the insurance denied due to greater then 30 tabs / month.  I relayed can give sample of 150mg  tabs (take one daily in am).  Will retry PA for taking 150mg  po daily.

## 2019-09-05 NOTE — Addendum Note (Signed)
Addended byUbaldo Glassing, Jaquell Seddon L on: 09/05/2019 10:40 AM   Modules accepted: Orders

## 2019-09-05 NOTE — Telephone Encounter (Signed)
PA approved sunosi 150mg  po daily 09-05-19 thru 09-04-2020.  [facility].

## 2019-09-05 NOTE — Telephone Encounter (Signed)
Pt has called for an update, Sandy,RN's message was read to her.  Pt states she took her last two pills this morning and would like to know if the office has any samples.  Please call

## 2019-09-05 NOTE — Addendum Note (Signed)
Addended by: Brandon Melnick on: 09/05/2019 09:26 AM   Modules accepted: Orders

## 2019-09-12 ENCOUNTER — Ambulatory Visit: Payer: BC Managed Care – PPO | Attending: Family

## 2019-09-12 DIAGNOSIS — Z23 Encounter for immunization: Secondary | ICD-10-CM

## 2019-09-12 NOTE — Progress Notes (Signed)
   Covid-19 Vaccination Clinic  Name:  Amber Riley    MRN: [id number] DOB: 1960-08-31  09/12/2019  Amber Riley was observed post Covid-19 immunization for 15 minutes without incident. She was provided with Vaccine Information Sheet and instruction to access the V-Safe system.   Amber Riley was instructed to call 911 with any severe reactions post vaccine: Marland Kitchen Difficulty breathing  . Swelling of face and throat  . A fast heartbeat  . A bad rash all over body  . Dizziness and weakness   Immunizations Administered    Name Date Dose VIS Date Route   Moderna COVID-19 Vaccine 09/12/2019 10:38 AM 0.5 mL 04/2019 Intramuscular   Manufacturer: Moderna   Lot: [id number]   [id number]

## 2019-11-01 ENCOUNTER — Ambulatory Visit: Payer: BC Managed Care – PPO | Admitting: Family Medicine

## 2020-01-18 ENCOUNTER — Other Ambulatory Visit: Payer: Self-pay | Admitting: Neurosurgery

## 2020-01-18 DIAGNOSIS — M4317 Spondylolisthesis, lumbosacral region: Secondary | ICD-10-CM

## 2020-02-12 ENCOUNTER — Other Ambulatory Visit: Payer: BC Managed Care – PPO

## 2020-02-26 ENCOUNTER — Other Ambulatory Visit: Payer: Self-pay | Admitting: Physician Assistant

## 2020-02-26 DIAGNOSIS — Z1231 Encounter for screening mammogram for malignant neoplasm of breast: Secondary | ICD-10-CM

## 2020-03-27 ENCOUNTER — Encounter (INDEPENDENT_AMBULATORY_CARE_PROVIDER_SITE_OTHER): Payer: Self-pay | Admitting: Ophthalmology

## 2020-03-27 ENCOUNTER — Ambulatory Visit (INDEPENDENT_AMBULATORY_CARE_PROVIDER_SITE_OTHER): Payer: BC Managed Care – PPO | Admitting: Ophthalmology

## 2020-03-27 DIAGNOSIS — T8522XA Displacement of intraocular lens, initial encounter: Secondary | ICD-10-CM

## 2020-03-27 DIAGNOSIS — H2702 Aphakia, left eye: Secondary | ICD-10-CM | POA: Diagnosis not present

## 2020-03-27 HISTORY — DX: Displacement of intraocular lens, initial encounter: T85.22XA

## 2020-03-27 HISTORY — DX: Aphakia, left eye: H27.02

## 2020-03-27 NOTE — Assessment & Plan Note (Signed)
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

## 2020-03-27 NOTE — Patient Instructions (Signed)
Patient is to continue on topical drop therapy  Ofloxacin 1 drop left eye 4 times daily  Prednisolone acetate 1 drop left eye 4 times daily  Patient instructed not to do any heavy lifting or bending for 24 more hours. She is to use the nighttime solid eye shield to prevent accidental compression of the globe  I have also instructed her to not do any rubbing or mashing of the left eye, ever

## 2020-03-27 NOTE — Assessment & Plan Note (Addendum)
The artificial lens implant inserted at time of cataract surgery, may over time,  lose the supportive structures holding the lens in proper position.   Most commonly this may occur naturally with aging, yet other causes include trauma, subsequent eye surgery, history of glaucoma, genetic factors,  and more.  Some types of IOL require removal and replacement with a different style of lens (exchange).  Some types of lenses may be retrieved at time of surgery and secured in place to recover vision.   I had a lengthy discussion with Amber Riley regarding the nature of dislocated intraocular lens, being like a disrupted tarp  on a trampoline or disrupted baggy, allowing for dislocation of the intraocular lens into the vitreous cavity. There is also the analogy of the vitreous is like Jell-O.  I described that vitrectomy, removal of the gelatinous material inside the eye would be required to allow for safe removal of this 1 piece intraocular lens which would not be adequate to restore pseudophakia. I explained that a 3 piece intraocular lens, with Prolene haptics (like arms on a lens) would be required to be placed in the sulcus.  All explained to Amber Riley as well that spectacle correction is still likely after surgery. Certainly for near vision.

## 2020-03-27 NOTE — Progress Notes (Signed)
03/27/2020     CHIEF COMPLAINT Patient presents for Retina Evaluation   HISTORY OF PRESENT ILLNESS: Amber Riley is a 59 y.o. female who presents to the clinic today for:   HPI    Retina Evaluation    In left eye.  This started 1 hour ago.  Duration of 1.  Context:  distance vision.  I, the attending physician,  performed the HPI with the patient and updated documentation appropriately.          Comments    Patient seen in consultation today in referral from Dr. Rutherford Guys, where earlier today attempted IOL exchange of the left eye to improve distance acuity is undertaken surgically, complicated by apparent dislocation of the replacement intraocular lens       Last edited by Hurman Horn, MD on 03/27/2020 10:04 AM. (History)      Referring physician: No referring provider defined for this encounter.  HISTORICAL INFORMATION:   Selected notes from the MEDICAL RECORD NUMBER       CURRENT MEDICATIONS: Current Outpatient Medications (Ophthalmic Drugs)  Medication Sig  . hydroxypropyl methylcellulose / hypromellose (ISOPTO TEARS / GONIOVISC) 2.5 % ophthalmic solution Place 1 drop into both eyes as needed for dry eyes. ARTIFICIAL TEARS.   No current facility-administered medications for this visit. (Ophthalmic Drugs)   Current Outpatient Medications (Other)  Medication Sig  . atorvastatin (LIPITOR) 10 MG tablet Take 10 mg every evening by mouth.  . cyclobenzaprine (FLEXERIL) 10 MG tablet Take 10 mg by mouth at bedtime.  . diclofenac (VOLTAREN) 50 MG EC tablet Take 50 mg by mouth 2 (two) times daily with a meal.  . estradiol (ESTRACE) 1 MG tablet Take 1 mg by mouth daily.  . fluticasone (FLONASE) 50 MCG/ACT nasal spray Place 2 sprays into both nostrils daily.   . methylphenidate (RITALIN) 10 MG tablet 1 in AM po, prn after lunchtime. (Patient not taking: Reported on 02/23/2019)  . naproxen sodium (ANAPROX) 220 MG tablet Take 220 mg by mouth daily as needed (for  pain). ALEVE.  Marland Kitchen omeprazole (PRILOSEC) 20 MG capsule Take 1 capsule (20 mg total) by mouth daily. (Patient taking differently: Take 20 mg by mouth daily as needed (heartburn). )  . Solriamfetol HCl (SUNOSI) 150 MG TABS Take one tablet in am.  [place] [id number] LOT [id number] exp 03/2020. (one pack of 7 tablets)  . SUNOSI 150 MG TABS Take 1 tablet by mouth daily.  Marland Kitchen venlafaxine XR (EFFEXOR-XR) 75 MG 24 hr capsule TAKE 1 CAPSULE BY MOUTH ONCE DAILY WITH FOOD FOR 30 DAYS   No current facility-administered medications for this visit. (Other)      REVIEW OF SYSTEMS:    ALLERGIES No Known Allergies  PAST MEDICAL HISTORY Past Medical History:  Diagnosis Date  . Anemia    hx of-was on iron pills yrs ago  . Chronic back pain    DDD/scoliosis  . Constipation    takes OTC stool softener,laxatives,or enema  . Depression    not on any meds  . GERD (gastroesophageal reflux disease)    occasionally but no meds  . History of migraine    last one a week ago  . History of panic attacks    doesn't take meds  . Hyperlipidemia    was on meds but has been off for yrs-told much improved  . Migraine   . Muscle spasm    takes Flexeril daily as needed  . Narcolepsy    was on meds yrs  ago;will take Melatonin if needed  . PONV (postoperative nausea and vomiting)   . Seasonal allergies    takes Claritin daily  . UTI (lower urinary tract infection)    treated in May with antibiotic  . Weakness    numbness and tingling down right left   Past Surgical History:  Procedure Laterality Date  . ABDOMINAL HYSTERECTOMY    . BILATERAL FOOT SURGERY    . COLONOSCOPY    . EYE SURGERY Left   . knot removed from left side of neck    . MYOMECTOMY     to remove fibroids    FAMILY HISTORY Family History  Problem Relation Age of Onset  . Breast cancer Mother 49    SOCIAL HISTORY Social History   Tobacco Use  . Smoking status: Never Smoker  . Smokeless tobacco: Never Used  Substance Use Topics  .  Alcohol use: Yes    Alcohol/week: 0.0 standard drinks    Comment: occasionally  . Drug use: No         OPHTHALMIC EXAM:  Base Eye Exam    Visual Acuity (ETDRS)      Right Left   Dist [place]  CF at 3'       Tonometry (Applanation, 10:49 AM)      Right Left   Pressure  16       Pupils      Pupils   Right PERRL   Left PERRL  Normal left eye by reverse testing        Slit Lamp and Fundus Exam    External Exam      Right Left   External Normal Normal       Slit Lamp Exam      Right Left   Lids/Lashes  Normal   Conjunctiva/Sclera  White and quiet   Cornea  1+ Descemet's folds   Anterior Chamber  Small vitreous prolapse through central capsule opening towards temporal cornea   Iris  Round and reactive, dilated pharmacologically   Lens  Central open posterior capsule, haptic of 1 piece acrylic lenses seen with inferior dislocation of the intraocular lens, dehiscence in the anterior capsule possible posterior capsule noted at the 7:30 position   Anterior Vitreous  Vitreous anterior       Fundus Exam      Right Left   Posterior Vitreous  Vitreous debris   Disc  Normal   C/D Ratio  Poor detail   Macula  Normal   Vessels  Normal vessels   Periphery  Normal,           IMAGING AND PROCEDURES  Imaging and Procedures for 03/27/20  B-Scan Ultrasound - OS - Left Eye       Quality was good. Findings included normal observations, vitreous opacities.   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   Most commonly this may occur naturally with aging, yet other causes include trauma, subsequent eye  surgery, history of glaucoma, genetic factors,  and more.  Some types of IOL require removal and replacement with a different style of lens (exchange).  Some  types of lenses may be retrieved at time of surgery and secured in place to recover vision.   I had a lengthy discussion with Amber Riley regarding the nature of dislocated intraocular lens, being like a disrupted tarp  on a trampoline or disrupted baggy, allowing for dislocation of the intraocular lens into the vitreous cavity. There is also the analogy of the vitreous is like Jell-O.  I described that vitrectomy, removal of the gelatinous material inside the eye would be required to allow for safe removal of this 1 piece intraocular lens which would not be adequate to restore pseudophakia. I explained that a 3 piece intraocular lens, with Prolene haptics (like arms on a lens) would be required to be placed in the sulcus.  All explained to Amber Riley as well that spectacle correction is still likely after surgery. Certainly for near vision.          ICD-10-CM   1. Dislocated intraocular lens, initial encounter  T85.22XA B-Scan Ultrasound - OS - Left Eye  2. Aphakia, left  H27.02     1. OS we will plan surgical restoration via vitrectomy, removal of dislocated 1 piece intraocular lens, and insertion of three-piece intraocular lens in the sulcus  2. With the upcoming holiday weekend, we will plan to call the patient on 04-01-20 to schedule surgical repair likely on 04-03-20, Wednesday afternoon.  3. Upon contact with her on Monday, 04/01/2020, if it opening exist for surgery on 04-02-20 we will proceed with surgery on that day.  Ophthalmic Meds Ordered this visit:  No orders of the defined types were placed in this encounter.      Return ,, Left eye, [facility], [facility], for Schedule vitrectomy, removal of dislocated intraocular lens, insertion three-piece intraocular lens.  Patient Instructions  Patient is to continue on  topical drop therapy  Ofloxacin 1 drop left eye 4 times daily  Prednisolone acetate 1 drop left eye 4 times daily  Patient instructed not to do any heavy lifting or bending for 24 more hours. She is to use the nighttime solid eye shield to prevent accidental compression of the globe  I have also instructed her to not do any rubbing or mashing of the left eye, ever   I discussed with the patient and   Explained the diagnoses, plan, and follow up with the patient and they expressed understanding.  Patient expressed understanding of the importance of proper follow up care.   Clent Demark Uzair Godley M.D. Diseases & Surgery of the Retina and Vitreous Retina & Diabetic [place] 03/27/20     Abbreviations: M myopia (nearsighted); A astigmatism; H hyperopia (farsighted); P presbyopia; Mrx spectacle prescription;  CTL contact lenses; OD right eye; OS left eye; OU both eyes  XT exotropia; ET esotropia; PEK punctate epithelial keratitis; PEE punctate epithelial erosions; DES dry eye syndrome; MGD meibomian gland dysfunction; ATs artificial tears; PFAT's preservative free artificial tears; [place] nuclear sclerotic cataract; PSC posterior subcapsular cataract; ERM epi-retinal membrane; PVD posterior vitreous detachment; RD retinal detachment; DM diabetes mellitus; DR diabetic retinopathy; NPDR non-proliferative diabetic retinopathy; PDR proliferative diabetic retinopathy; CSME clinically significant macular edema; DME diabetic macular edema; dbh dot blot hemorrhages; CWS cotton wool spot; POAG primary open angle glaucoma; C/D cup-to-disc ratio; HVF humphrey visual field; GVF goldmann visual field; OCT optical coherence tomography; IOP intraocular pressure; BRVO Branch retinal vein occlusion; CRVO central retinal vein occlusion; CRAO central retinal artery occlusion; BRAO branch retinal artery occlusion; RT retinal tear; SB scleral buckle; PPV pars  plana vitrectomy; VH Vitreous hemorrhage; PRP panretinal laser  photocoagulation; IVK intravitreal kenalog; VMT vitreomacular traction; MH Macular hole;  NVD neovascularization of the disc; NVE neovascularization elsewhere; AREDS age related eye disease study; ARMD age related macular degeneration; POAG primary open angle glaucoma; EBMD epithelial/anterior basement membrane dystrophy; ACIOL anterior chamber intraocular lens; IOL intraocular lens; PCIOL posterior chamber intraocular lens; Phaco/IOL phacoemulsification with intraocular lens placement; [employer] photorefractive keratectomy; LASIK laser assisted in situ keratomileusis; HTN hypertension; DM diabetes mellitus; COPD chronic obstructive pulmonary disease

## 2020-04-02 ENCOUNTER — Ambulatory Visit (INDEPENDENT_AMBULATORY_CARE_PROVIDER_SITE_OTHER): Payer: BC Managed Care – PPO

## 2020-04-02 ENCOUNTER — Other Ambulatory Visit: Payer: Self-pay

## 2020-04-02 ENCOUNTER — Encounter (INDEPENDENT_AMBULATORY_CARE_PROVIDER_SITE_OTHER): Payer: Self-pay

## 2020-04-02 ENCOUNTER — Ambulatory Visit: Payer: BC Managed Care – PPO

## 2020-04-02 DIAGNOSIS — T8522XA Displacement of intraocular lens, initial encounter: Secondary | ICD-10-CM

## 2020-04-02 NOTE — Progress Notes (Signed)
04/02/2020     CHIEF COMPLAINT Patient presents for Pre-op Exam   HISTORY OF PRESENT ILLNESS: Amber Riley is a 59 y.o. female who presents to the clinic today for:   HPI    Pre Op OS for PPV/removal IOL/insertion IOL OS 04/03/2020   Last edited by Rockie Neighbours, [place] on 04/02/2020  3:59 PM. (History)        HISTORICAL INFORMATION:   Selected notes from the MEDICAL RECORD NUMBER       CURRENT MEDICATIONS: Current Outpatient Medications (Ophthalmic Drugs)  Medication Sig  . ofloxacin (OCUFLOX) 0.3 % ophthalmic solution Instill 1 drop TID in operative eye starting 2 days prior to surgery and after surgery for 3 weeks.  . hydroxypropyl methylcellulose / hypromellose (ISOPTO TEARS / GONIOVISC) 2.5 % ophthalmic solution Place 1 drop into both eyes as needed for dry eyes. ARTIFICIAL TEARS.  Marland Kitchen prednisoLONE acetate (PRED FORTE) 1 % ophthalmic suspension SMARTSIG:In Eye(s)   No current facility-administered medications for this visit. (Ophthalmic Drugs)   Current Outpatient Medications (Other)  Medication Sig  . atorvastatin (LIPITOR) 10 MG tablet Take 10 mg every evening by mouth.  . cyclobenzaprine (FLEXERIL) 10 MG tablet Take 10 mg by mouth at bedtime.  . diclofenac (VOLTAREN) 50 MG EC tablet Take 50 mg by mouth 2 (two) times daily with a meal.  . estradiol (ESTRACE) 1 MG tablet Take 1 mg by mouth daily.  . fluticasone (FLONASE) 50 MCG/ACT nasal spray Place 2 sprays into both nostrils daily.   . methylphenidate (RITALIN) 10 MG tablet 1 in AM po, prn after lunchtime. (Patient not taking: Reported on 02/23/2019)  . naproxen sodium (ANAPROX) 220 MG tablet Take 220 mg by mouth daily as needed (for pain). ALEVE.  Marland Kitchen omeprazole (PRILOSEC) 20 MG capsule Take 1 capsule (20 mg total) by mouth daily. (Patient taking differently: Take 20 mg by mouth daily as needed (heartburn). )  . Solriamfetol HCl (SUNOSI) 150 MG TABS Take one tablet in am.  [place] [id number] LOT [id number] exp 03/2020.  (one pack of 7 tablets)  . SUNOSI 150 MG TABS Take 1 tablet by mouth daily.  Marland Kitchen venlafaxine XR (EFFEXOR-XR) 75 MG 24 hr capsule TAKE 1 CAPSULE BY MOUTH ONCE DAILY WITH FOOD FOR 30 DAYS   No current facility-administered medications for this visit. (Other)     ALLERGIES No Known Allergies  PAST MEDICAL HISTORY Past Medical History:  Diagnosis Date  . Anemia    hx of-was on iron pills yrs ago  . Chronic back pain    DDD/scoliosis  . Constipation    takes OTC stool softener,laxatives,or enema  . Depression    not on any meds  . GERD (gastroesophageal reflux disease)    occasionally but no meds  . History of migraine    last one a week ago  . History of panic attacks    doesn't take meds  . Hyperlipidemia    was on meds but has been off for yrs-told much improved  . Migraine   . Muscle spasm    takes Flexeril daily as needed  . 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 PONV (postoperative nausea and vomiting)   . Seasonal allergies    takes Claritin daily  . UTI (lower urinary tract infection)    treated in May with antibiotic  . Weakness    numbness and tingling down right left   Past Surgical History:  Procedure Laterality Date  .  ABDOMINAL HYSTERECTOMY    . BILATERAL FOOT SURGERY    . COLONOSCOPY    . EYE SURGERY Left   . knot removed from left side of neck    . MYOMECTOMY     to remove fibroids    FAMILY HISTORY Family History  Problem Relation Age of Onset  . Breast cancer Mother 63    SOCIAL HISTORY Social History   Tobacco Use  . Smoking status: Never Smoker  . Smokeless tobacco: Never Used  Substance Use Topics  . Alcohol use: Yes    Alcohol/week: 0.0 standard drinks    Comment: occasionally  . Drug use: No         OPHTHALMIC EXAM: Base Eye Exam    Visual Acuity    ETDRS       Tonometry    Tonopen, 4:02 PM       Neuro/Psych    Oriented x3: Yes   Mood/Affect: Normal          IMAGING AND PROCEDURES   Imaging and Procedures for @TODAY @           ASSESSMENT/PLAN:  No diagnosis found.  Ophthalmic Meds Ordered this visit:  No orders of the defined types were placed in this encounter.       Pre-op completed. Operative consent obtained with pre-op eye drops reviewed with [name] and sent via [facility] as needed. Post op instructions reviewed with patient and per patient all questions answered.  Rockie Neighbours, COA

## 2020-04-03 ENCOUNTER — Inpatient Hospital Stay: Admission: RE | Admit: 2020-04-03 | Payer: BC Managed Care – PPO | Source: Ambulatory Visit

## 2020-04-03 ENCOUNTER — Encounter (AMBULATORY_SURGERY_CENTER): Payer: BC Managed Care – PPO | Admitting: Ophthalmology

## 2020-04-03 DIAGNOSIS — H2702 Aphakia, left eye: Secondary | ICD-10-CM | POA: Diagnosis not present

## 2020-04-03 DIAGNOSIS — T8522XA Displacement of intraocular lens, initial encounter: Secondary | ICD-10-CM | POA: Diagnosis not present

## 2020-04-04 ENCOUNTER — Other Ambulatory Visit: Payer: Self-pay

## 2020-04-04 ENCOUNTER — Ambulatory Visit (INDEPENDENT_AMBULATORY_CARE_PROVIDER_SITE_OTHER): Payer: BC Managed Care – PPO | Admitting: Ophthalmology

## 2020-04-04 ENCOUNTER — Encounter (INDEPENDENT_AMBULATORY_CARE_PROVIDER_SITE_OTHER): Payer: Self-pay | Admitting: Ophthalmology

## 2020-04-04 ENCOUNTER — Encounter (INDEPENDENT_AMBULATORY_CARE_PROVIDER_SITE_OTHER): Payer: BC Managed Care – PPO | Admitting: Ophthalmology

## 2020-04-04 DIAGNOSIS — H2702 Aphakia, left eye: Secondary | ICD-10-CM

## 2020-04-04 DIAGNOSIS — H1820 Unspecified corneal edema: Secondary | ICD-10-CM

## 2020-04-04 DIAGNOSIS — T8522XA Displacement of intraocular lens, initial encounter: Secondary | ICD-10-CM

## 2020-04-04 HISTORY — DX: Unspecified corneal edema: H18.20

## 2020-04-04 MED ORDER — PREDNISOLONE ACETATE 1 % OP SUSP: 1.0000 [drp] | Freq: Four times a day (QID) | OPHTHALMIC | 4 refills | Status: AC

## 2020-04-04 MED ORDER — OFLOXACIN 0.3 % OP SOLN: OPHTHALMIC | 1 refills | Status: AC

## 2020-04-04 NOTE — Assessment & Plan Note (Signed)
04-03-20  Secondary IOL, sulcus PC

## 2020-04-04 NOTE — Progress Notes (Signed)
04/04/2020     CHIEF COMPLAINT Patient presents for Post-op Follow-up   HISTORY OF PRESENT ILLNESS: Amber Riley is a 59 y.o. female who presents to the clinic today for:   HPI    Post-op Follow-up    In left eye.  Discomfort includes pain.          Comments    1 Day POV OS  Pt c/o watering sensation and aching/scratchiness OS. Pt denies nausea or vomiting following sx.       Last edited by Rockie Neighbours, [place] on 04/04/2020  3:35 PM. (History)      Referring physician: Lennie Odor, [place],  [place] [id number]  HISTORICAL INFORMATION:   Selected notes from the MEDICAL RECORD NUMBER       CURRENT MEDICATIONS: Current Outpatient Medications (Ophthalmic Drugs)  Medication Sig  . hydroxypropyl methylcellulose / hypromellose (ISOPTO TEARS / GONIOVISC) 2.5 % ophthalmic solution Place 1 drop into both eyes as needed for dry eyes. ARTIFICIAL TEARS.  Marland Kitchen ofloxacin (OCUFLOX) 0.3 % ophthalmic solution Instill 1 drop TID in operative eye starting 2 days prior to surgery and after surgery for 3 weeks.  . prednisoLONE acetate (PRED FORTE) 1 % ophthalmic suspension Place 1 drop into the left eye 4 (four) times daily.   No current facility-administered medications for this visit. (Ophthalmic Drugs)   Current Outpatient Medications (Other)  Medication Sig  . atorvastatin (LIPITOR) 10 MG tablet Take 10 mg every evening by mouth.  . cyclobenzaprine (FLEXERIL) 10 MG tablet Take 10 mg by mouth at bedtime.  . diclofenac (VOLTAREN) 50 MG EC tablet Take 50 mg by mouth 2 (two) times daily with a meal.  . estradiol (ESTRACE) 1 MG tablet Take 1 mg by mouth daily.  . fluticasone (FLONASE) 50 MCG/ACT nasal spray Place 2 sprays into both nostrils daily.   . methylphenidate (RITALIN) 10 MG tablet 1 in AM po, prn after lunchtime. (Patient not taking: Reported on 02/23/2019)  . naproxen sodium (ANAPROX) 220 MG tablet Take 220 mg by mouth daily as needed (for  pain). ALEVE.  Marland Kitchen omeprazole (PRILOSEC) 20 MG capsule Take 1 capsule (20 mg total) by mouth daily. (Patient taking differently: Take 20 mg by mouth daily as needed (heartburn). )  . Solriamfetol HCl (SUNOSI) 150 MG TABS Take one tablet in am.  [place] [id number] LOT [id number] exp 03/2020. (one pack of 7 tablets)  . SUNOSI 150 MG TABS Take 1 tablet by mouth daily.  Marland Kitchen venlafaxine XR (EFFEXOR-XR) 75 MG 24 hr capsule TAKE 1 CAPSULE BY MOUTH ONCE DAILY WITH FOOD FOR 30 DAYS   No current facility-administered medications for this visit. (Other)      REVIEW OF SYSTEMS:    ALLERGIES No Known Allergies  PAST MEDICAL HISTORY Past Medical History:  Diagnosis Date  . Anemia    hx of-was on iron pills yrs ago  . Chronic back pain    DDD/scoliosis  . Constipation    takes OTC stool softener,laxatives,or enema  . Depression    not on any meds  . GERD (gastroesophageal reflux disease)    occasionally but no meds  . History of migraine    last one a week ago  . History of panic attacks    doesn't take meds  . Hyperlipidemia    was on meds but has been off for yrs-told much improved  . Migraine   . Muscle spasm    takes Flexeril daily as needed  .  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 PONV (postoperative nausea and vomiting)   . Seasonal allergies    takes Claritin daily  . UTI (lower urinary tract infection)    treated in May with antibiotic  . Weakness    numbness and tingling down right left   Past Surgical History:  Procedure Laterality Date  . ABDOMINAL HYSTERECTOMY    . BILATERAL FOOT SURGERY    . COLONOSCOPY    . EYE SURGERY Left   . knot removed from left side of neck    . MYOMECTOMY     to remove fibroids    FAMILY HISTORY Family History  Problem Relation Age of Onset  . Breast cancer Mother 40    SOCIAL HISTORY Social History   Tobacco Use  . Smoking status: Never Smoker  . Smokeless tobacco: Never Used  Substance Use Topics  .  Alcohol use: Yes    Alcohol/week: 0.0 standard drinks    Comment: occasionally  . Drug use: No         OPHTHALMIC EXAM:  Base Eye Exam    Visual Acuity (ETDRS)      Right Left   Dist [place] 20/20 20/400   Dist ph [place]  NI       Tonometry (Tonopen, 3:36 PM)      Right Left   Pressure 14 23       Tonometry #2 (Tonopen, 3:42 PM)      Right Left   Pressure  23       Pupils      Dark Light Shape React APD   Right 4 3 Round Brisk None   Left 6 6 Round Dilated None       Extraocular Movement      Right Left    Full Full       Neuro/Psych    Oriented x3: Yes   Mood/Affect: Normal       Dilation    Left eye: 1.0% Mydriacyl, 2.5% Phenylephrine @ 3:42 PM        Slit Lamp and Fundus Exam    External Exam      Right Left   External Normal Normal       Slit Lamp Exam      Right Left   Lids/Lashes  Normal   Conjunctiva/Sclera  1+ injection   Cornea  Temporal keratotomy incision well closed 3 interrupted 10-0 nylon's tags exposed   Anterior Chamber  Deep, quiet   Iris  Round pharmacologically dilated   Lens  Centered posterior chamber intraocular lens   Anterior Vitreous  Clear       Fundus Exam      Right Left   Posterior Vitreous  Hazy view through cornea   Vessels  Red reflex          IMAGING AND PROCEDURES  Imaging and Procedures for 04/04/20           ASSESSMENT/PLAN:  Dislocated intraocular lens, initial encounter 04-03-20 Vitrectomy, removal of posterior chamber dislocated lens from the vitreous cavity, insertion secondary posterior chamber lens, sulcus  [employer] [id number]   +19.00 power  Aphakia, left 04-03-20  Secondary IOL, sulcus PC  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  Now recurrent today after removal of intraocular lens and placement of posterior chamber lens in the sulcus      ICD-10-CM  1. Dislocated intraocular lens, initial encounter  T85.22XA    2. Aphakia, left  H27.02   3. Corneal edema of left eye  H18.20     1.  OS looks great today with a lens in the proper location.  I explained to the patient under no circumstance should she ever touch the surface of the eye or compress the eye with any pressure.  I cautioned her against this specifically as she did touch the lateral surface of the bulbar conjunctiva in the office today secondary to feeling a stitch in the left eye prior to my cautioning her.    2.  I explained the patient that ongoing clearance of the cornea and cloudiness should occur over the coming weeks.  I explained that she should continue the topical drops as prior to surgery  3.  I refilled her medications ofloxacin and prednisolone acetate    Ophthalmic Meds Ordered this visit:  Meds ordered this encounter  Medications  . prednisoLONE acetate (PRED FORTE) 1 % ophthalmic suspension    Sig: Place 1 drop into the left eye 4 (four) times daily.    Dispense:  5 mL    Refill:  4  . ofloxacin (OCUFLOX) 0.3 % ophthalmic solution    Sig: Instill 1 drop TID in operative eye starting 2 days prior to surgery and after surgery for 3 weeks.    Dispense:  5 mL    Refill:  1       Return in about 1 week (around 04/11/2020) for dilate, OS.  Patient Instructions  Ofloxacin 1 drop left eye 3 times daily  Prednisolone acetate 1 drop left eye 4 times daily    Explained the diagnoses, plan, and follow up with the patient and they expressed understanding.  Patient expressed understanding of the importance of proper follow up care.   Clent Demark Jahshua Bonito M.D. Diseases & Surgery of the Retina and Vitreous Retina & Diabetic [place] 04/04/20     Abbreviations: M myopia (nearsighted); A astigmatism; H hyperopia (farsighted); P presbyopia; Mrx spectacle prescription;  CTL contact lenses; OD right eye; OS left eye; OU both eyes  XT exotropia; ET esotropia; PEK punctate epithelial keratitis; PEE punctate epithelial  erosions; DES dry eye syndrome; MGD meibomian gland dysfunction; ATs artificial tears; PFAT's preservative free artificial tears; [place] nuclear sclerotic cataract; PSC posterior subcapsular cataract; ERM epi-retinal membrane; PVD posterior vitreous detachment; RD retinal detachment; DM diabetes mellitus; DR diabetic retinopathy; NPDR non-proliferative diabetic retinopathy; PDR proliferative diabetic retinopathy; CSME clinically significant macular edema; DME diabetic macular edema; dbh dot blot hemorrhages; CWS cotton wool spot; POAG primary open angle glaucoma; C/D cup-to-disc ratio; HVF humphrey visual field; GVF goldmann visual field; OCT optical coherence tomography; IOP intraocular pressure; BRVO Branch retinal vein occlusion; CRVO central retinal vein occlusion; CRAO central retinal artery occlusion; BRAO branch retinal artery occlusion; RT retinal tear; SB scleral buckle; PPV pars plana vitrectomy; VH Vitreous hemorrhage; PRP panretinal laser photocoagulation; IVK intravitreal kenalog; VMT vitreomacular traction; MH Macular hole;  NVD neovascularization of the disc; NVE neovascularization elsewhere; AREDS age related eye disease study; ARMD age related macular degeneration; POAG primary open angle glaucoma; EBMD epithelial/anterior basement membrane dystrophy; ACIOL anterior chamber intraocular lens; IOL intraocular lens; PCIOL posterior chamber intraocular lens; Phaco/IOL phacoemulsification with intraocular lens placement; [place] photorefractive keratectomy; LASIK laser assisted in situ keratomileusis; HTN hypertension; DM diabetes mellitus; COPD chronic obstructive pulmonary disease

## 2020-04-04 NOTE — Assessment & Plan Note (Signed)
04-03-20 Vitrectomy, removal of posterior chamber dislocated lens from the vitreous cavity, insertion secondary posterior chamber lens, sulcus  [employer] [id number]   +19.00 power

## 2020-04-04 NOTE — Patient Instructions (Signed)
Ofloxacin 1 drop left eye 3 times daily  Prednisolone acetate 1 drop left eye 4 times daily

## 2020-04-04 NOTE — Assessment & Plan Note (Signed)
This is recurrent, was present on first day postop last week after attempted IOL exchange.  Now recurrent today after removal of intraocular lens and placement of posterior chamber lens in the sulcus

## 2020-04-10 ENCOUNTER — Encounter (INDEPENDENT_AMBULATORY_CARE_PROVIDER_SITE_OTHER): Payer: BC Managed Care – PPO | Admitting: Ophthalmology

## 2020-04-11 ENCOUNTER — Encounter (INDEPENDENT_AMBULATORY_CARE_PROVIDER_SITE_OTHER): Payer: Self-pay | Admitting: Ophthalmology

## 2020-04-11 ENCOUNTER — Encounter (INDEPENDENT_AMBULATORY_CARE_PROVIDER_SITE_OTHER): Payer: BC Managed Care – PPO | Admitting: Ophthalmology

## 2020-04-11 ENCOUNTER — Other Ambulatory Visit: Payer: Self-pay

## 2020-04-11 ENCOUNTER — Ambulatory Visit (INDEPENDENT_AMBULATORY_CARE_PROVIDER_SITE_OTHER): Payer: BC Managed Care – PPO | Admitting: Ophthalmology

## 2020-04-11 DIAGNOSIS — T8522XA Displacement of intraocular lens, initial encounter: Secondary | ICD-10-CM

## 2020-04-11 DIAGNOSIS — H2702 Aphakia, left eye: Secondary | ICD-10-CM

## 2020-04-11 DIAGNOSIS — H1820 Unspecified corneal edema: Secondary | ICD-10-CM

## 2020-04-11 NOTE — Assessment & Plan Note (Signed)
This condition now improved status post removal of dislocated intraocular lens and placement of sulcus intraocular lens  Patient asked about whether glasses will be necessary in the future.  I reiterated again with her today as I did preoperatively that it is likely glasses will be necessary and yet follow-up with Dr. Gershon Crane and his staff who are equipped to handle those measurements will be appropriate once healing is is improved completed

## 2020-04-11 NOTE — Progress Notes (Signed)
04/11/2020     CHIEF COMPLAINT Patient presents for Post-op Follow-up   HISTORY OF PRESENT ILLNESS: Amber Riley is a 59 y.o. female who presents to the clinic today for:   HPI    Post-op Follow-up    In left eye.  Discomfort includes pain and floaters.  Vision is blurred at distance and is blurred at near.  I, the attending physician,  performed the HPI with the patient and updated documentation appropriately.          Comments    1 Week s\p OS. OCT  Pt c/o vision being very blurry. Pt c/o multiple floaters in OS. Pt c/o occasional pain. Using gtts as directed.       Last edited by Tilda Franco on 04/11/2020  3:26 PM. (History)      Referring physician: Lennie Odor, [place],  [place] [address]  HISTORICAL INFORMATION:   Selected notes from the MEDICAL RECORD NUMBER       CURRENT MEDICATIONS: Current Outpatient Medications (Ophthalmic Drugs)  Medication Sig  . hydroxypropyl methylcellulose / hypromellose (ISOPTO TEARS / GONIOVISC) 2.5 % ophthalmic solution Place 1 drop into both eyes as needed for dry eyes. ARTIFICIAL TEARS.  Marland Kitchen ofloxacin (OCUFLOX) 0.3 % ophthalmic solution Instill 1 drop TID in operative eye starting 2 days prior to surgery and after surgery for 3 weeks.  . prednisoLONE acetate (PRED FORTE) 1 % ophthalmic suspension Place 1 drop into the left eye 4 (four) times daily.   No current facility-administered medications for this visit. (Ophthalmic Drugs)   Current Outpatient Medications (Other)  Medication Sig  . atorvastatin (LIPITOR) 10 MG tablet Take 10 mg every evening by mouth.  . cyclobenzaprine (FLEXERIL) 10 MG tablet Take 10 mg by mouth at bedtime.  . diclofenac (VOLTAREN) 50 MG EC tablet Take 50 mg by mouth 2 (two) times daily with a meal.  . estradiol (ESTRACE) 1 MG tablet Take 1 mg by mouth daily.  . fluticasone (FLONASE) 50 MCG/ACT nasal spray Place 2 sprays into both nostrils daily.   .  methylphenidate (RITALIN) 10 MG tablet 1 in AM po, prn after lunchtime. (Patient not taking: Reported on 02/23/2019)  . naproxen sodium (ANAPROX) 220 MG tablet Take 220 mg by mouth daily as needed (for pain). ALEVE.  Marland Kitchen omeprazole (PRILOSEC) 20 MG capsule Take 1 capsule (20 mg total) by mouth daily. (Patient taking differently: Take 20 mg by mouth daily as needed (heartburn). )  . Solriamfetol HCl (SUNOSI) 150 MG TABS Take one tablet in am.  [place] [id number] LOT [id number] exp 03/2020. (one pack of 7 tablets)  . SUNOSI 150 MG TABS Take 1 tablet by mouth daily.  Marland Kitchen venlafaxine XR (EFFEXOR-XR) 75 MG 24 hr capsule TAKE 1 CAPSULE BY MOUTH ONCE DAILY WITH FOOD FOR 30 DAYS   No current facility-administered medications for this visit. (Other)      REVIEW OF SYSTEMS:    ALLERGIES No Known Allergies  PAST MEDICAL HISTORY Past Medical History:  Diagnosis Date  . Anemia    hx of-was on iron pills yrs ago  . Chronic back pain    DDD/scoliosis  . Constipation    takes OTC stool softener,laxatives,or enema  . Depression    not on any meds  . GERD (gastroesophageal reflux disease)    occasionally but no meds  . History of migraine    last one a week ago  . History of panic attacks    doesn't  take meds  . Hyperlipidemia    was on meds but has been off for yrs-told much improved  . Migraine   . Muscle spasm    takes Flexeril daily as needed  . 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 PONV (postoperative nausea and vomiting)   . Seasonal allergies    takes Claritin daily  . UTI (lower urinary tract infection)    treated in May with antibiotic  . Weakness    numbness and tingling down right left   Past Surgical History:  Procedure Laterality Date  . ABDOMINAL HYSTERECTOMY    . BILATERAL FOOT SURGERY    . COLONOSCOPY    . EYE SURGERY Left   . knot removed from left side of neck    . MYOMECTOMY     to remove fibroids    FAMILY HISTORY Family History   Problem Relation Age of Onset  . Breast cancer Mother 44    SOCIAL HISTORY Social History   Tobacco Use  . Smoking status: Never Smoker  . Smokeless tobacco: Never Used  Substance Use Topics  . Alcohol use: Yes    Alcohol/week: 0.0 standard drinks    Comment: occasionally  . Drug use: No         OPHTHALMIC EXAM: Base Eye Exam    Visual Acuity (Snellen - Linear)      Right Left   Dist [place] 20/30 20/200   Dist ph [place]  20/40       Tonometry (Tonopen, 3:33 PM)      Right Left   Pressure 13 14       Neuro/Psych    Oriented x3: Yes   Mood/Affect: Normal       Dilation    Left eye: 1.0% Mydriacyl, 2.5% Phenylephrine @ 3:33 PM        Slit Lamp and Fundus Exam    External Exam      Right Left   External Normal Normal       Slit Lamp Exam      Right Left   Lids/Lashes  Normal   Conjunctiva/Sclera  1+ injection   Cornea  Temporal keratotomy incision well closed 3 interrupted 10-0 nylon's tags exposed   Anterior Chamber  Deep, quiet   Iris  Round pharmacologically dilated   Lens  Centered posterior chamber intraocular lens   Anterior Vitreous  Clear       Fundus Exam      Right Left   Posterior Vitreous  Hazy view through cornea   Disc  Normal   C/D Ratio  .1   Macula  Normal   Vessels  attached   Periphery  Normal          IMAGING AND PROCEDURES  Imaging and Procedures for 04/11/20  OCT, Retina - OU - Both Eyes       Right Eye Quality was good. Scan locations included subfoveal. Progression has no prior data. Findings include normal foveal contour.   Left Eye Quality was good. Progression has no prior data.   Notes Hazy view through medial opacity anteriorly OS, macular thickness left eye appears normal                ASSESSMENT/PLAN:  Corneal edema of left eye OS improving, 1 week status post vitrectomy, removal of dislocated intraocular lens and placement of 3 piece intraocular lens stably in the sulcus  Aphakia, left This  condition now improved status  post removal of dislocated intraocular lens and placement of sulcus intraocular lens  Patient asked about whether glasses will be necessary in the future.  I reiterated again with her today as I did preoperatively that it is likely glasses will be necessary and yet follow-up with Dr. Gershon Crane and his staff who are equipped to handle those measurements will be appropriate once healing is is improved completed      ICD-10-CM   1. Dislocated intraocular lens, initial encounter  T85.22XA OCT, Retina - OU - Both Eyes  2. Corneal edema of left eye  H18.20   3. Aphakia, left  H27.02     1.  OS healing well 1 week postop mild corneal edema continues to clear as well as potential acuity now 20/40 via pinhole.  2.  Patient instructed to continue topical medications and refill the medication should they current bottles be completed  3.  Ophthalmic Meds Ordered this visit:  No orders of the defined types were placed in this encounter.      Return in about 2 weeks (around 04/25/2020) for dilate, OS.  Patient Instructions  Patient instructed to use ofloxacin 1 drop left eye 4 times daily  Prednisolone acetate 1 drop left eye 4 times daily until the next examination which should occur in 2 weeks    Explained the diagnoses, plan, and follow up with the patient and they expressed understanding.  Patient expressed understanding of the importance of proper follow up care.   Clent Demark Edana Aguado M.D. Diseases & Surgery of the Retina and Vitreous Retina [facility] Diabetic [place] 04/11/20     Abbreviations: M myopia (nearsighted); A astigmatism; H hyperopia (farsighted); P presbyopia; Mrx spectacle prescription;  CTL contact lenses; OD right eye; OS left eye; OU both eyes  XT exotropia; ET esotropia; PEK punctate epithelial keratitis; PEE punctate epithelial erosions; DES dry eye syndrome; MGD meibomian gland dysfunction; ATs artificial tears; PFAT's preservative free  artificial tears; [place] nuclear sclerotic cataract; PSC posterior subcapsular cataract; ERM epi-retinal membrane; PVD posterior vitreous detachment; RD retinal detachment; DM diabetes mellitus; DR diabetic retinopathy; NPDR non-proliferative diabetic retinopathy; PDR proliferative diabetic retinopathy; CSME clinically significant macular edema; DME diabetic macular edema; dbh dot blot hemorrhages; CWS cotton wool spot; POAG primary open angle glaucoma; C/D cup-to-disc ratio; HVF humphrey visual field; GVF goldmann visual field; OCT optical coherence tomography; IOP intraocular pressure; BRVO Branch retinal vein occlusion; CRVO central retinal vein occlusion; CRAO central retinal artery occlusion; BRAO branch retinal artery occlusion; RT retinal tear; SB scleral buckle; PPV pars plana vitrectomy; VH Vitreous hemorrhage; PRP panretinal laser photocoagulation; IVK intravitreal kenalog; VMT vitreomacular traction; MH Macular hole;  NVD neovascularization of the disc; NVE neovascularization elsewhere; AREDS age related eye disease study; ARMD age related macular degeneration; POAG primary open angle glaucoma; EBMD epithelial/anterior basement membrane dystrophy; ACIOL anterior chamber intraocular lens; IOL intraocular lens; PCIOL posterior chamber intraocular lens; Phaco/IOL phacoemulsification with intraocular lens placement; [place] photorefractive keratectomy; LASIK laser assisted in situ keratomileusis; HTN hypertension; DM diabetes mellitus; COPD chronic obstructive pulmonary disease

## 2020-04-11 NOTE — Patient Instructions (Signed)
Patient instructed to use ofloxacin 1 drop left eye 4 times daily  Prednisolone acetate 1 drop left eye 4 times daily until the next examination which should occur in 2 weeks

## 2020-04-11 NOTE — Assessment & Plan Note (Signed)
OS improving, 1 week status post vitrectomy, removal of dislocated intraocular lens and placement of 3 piece intraocular lens stably in the sulcus

## 2020-04-23 ENCOUNTER — Encounter (INDEPENDENT_AMBULATORY_CARE_PROVIDER_SITE_OTHER): Payer: Self-pay | Admitting: Ophthalmology

## 2020-04-23 ENCOUNTER — Other Ambulatory Visit: Payer: Self-pay

## 2020-04-23 ENCOUNTER — Ambulatory Visit (INDEPENDENT_AMBULATORY_CARE_PROVIDER_SITE_OTHER): Payer: BC Managed Care – PPO | Admitting: Ophthalmology

## 2020-04-23 DIAGNOSIS — T8522XA Displacement of intraocular lens, initial encounter: Secondary | ICD-10-CM

## 2020-04-23 DIAGNOSIS — H1820 Unspecified corneal edema: Secondary | ICD-10-CM

## 2020-04-23 DIAGNOSIS — H2702 Aphakia, left eye: Secondary | ICD-10-CM

## 2020-04-23 DIAGNOSIS — Z961 Presence of intraocular lens: Secondary | ICD-10-CM

## 2020-04-23 HISTORY — DX: Presence of intraocular lens: Z96.1

## 2020-04-23 NOTE — Progress Notes (Addendum)
04/23/2020     CHIEF COMPLAINT Patient presents for Post-op Follow-up (2 Week POV OS s/p dislocated IOL//Pt sts VA OS is gradually improving. Pt reports intermittent pain OS, but nothing constant. Pt sts, "still have that scratchy feeling" OS.)   HISTORY OF PRESENT ILLNESS: Amber Riley is a 59 y.o. female who presents to the clinic today for:   HPI    Post-op Follow-up    In left eye.  Vision is improved. Additional comments: 2 Week POV OS s/p dislocated IOL  Pt sts VA OS is gradually improving. Pt reports intermittent pain OS, but nothing constant. Pt sts, "still have that scratchy feeling" OS.       Last edited by Amber Riley, [place] on 04/23/2020  3:54 PM. (History)      Referring physician: Lennie Riley, [place],  [place] [address]  HISTORICAL INFORMATION:   Selected notes from the MEDICAL RECORD NUMBER       CURRENT MEDICATIONS: Current Outpatient Medications (Ophthalmic Drugs)  Medication Sig  . hydroxypropyl methylcellulose / hypromellose (ISOPTO TEARS / GONIOVISC) 2.5 % ophthalmic solution Place 1 drop into both eyes as needed for dry eyes. ARTIFICIAL TEARS.  Amber Riley ofloxacin (OCUFLOX) 0.3 % ophthalmic solution Instill 1 drop TID in operative eye starting 2 days prior to surgery and after surgery for 3 weeks.  . prednisoLONE acetate (PRED FORTE) 1 % ophthalmic suspension Place 1 drop into the left eye 4 (four) times daily.   No current facility-administered medications for this visit. (Ophthalmic Drugs)   Current Outpatient Medications (Other)  Medication Sig  . atorvastatin (LIPITOR) 10 MG tablet Take 10 mg every evening by mouth.  . cyclobenzaprine (FLEXERIL) 10 MG tablet Take 10 mg by mouth at bedtime.  . diclofenac (VOLTAREN) 50 MG EC tablet Take 50 mg by mouth 2 (two) times daily with a meal.  . estradiol (ESTRACE) 1 MG tablet Take 1 mg by mouth daily.  . fluticasone (FLONASE) 50 MCG/ACT nasal spray Place 2 sprays into both  nostrils daily.   . methylphenidate (RITALIN) 10 MG tablet 1 in AM po, prn after lunchtime. (Patient not taking: Reported on 02/23/2019)  . naproxen sodium (ANAPROX) 220 MG tablet Take 220 mg by mouth daily as needed (for pain). ALEVE.  Amber Riley omeprazole (PRILOSEC) 20 MG capsule Take 1 capsule (20 mg total) by mouth daily. (Patient taking differently: Take 20 mg by mouth daily as needed (heartburn). )  . Solriamfetol HCl (SUNOSI) 150 MG TABS Take one tablet in am.  [place] [id number] LOT [id number] exp 03/2020. (one pack of 7 tablets)  . SUNOSI 150 MG TABS Take 1 tablet by mouth daily.  Amber Riley venlafaxine XR (EFFEXOR-XR) 75 MG 24 hr capsule TAKE 1 CAPSULE BY MOUTH ONCE DAILY WITH FOOD FOR 30 DAYS   No current facility-administered medications for this visit. (Other)      REVIEW OF SYSTEMS:    ALLERGIES No Known Allergies  PAST MEDICAL HISTORY Past Medical History:  Diagnosis Date  . Anemia    hx of-was on iron pills yrs ago  . Chronic back pain    DDD/scoliosis  . Constipation    takes OTC stool softener,laxatives,or enema  . Depression    not on any meds  . GERD (gastroesophageal reflux disease)    occasionally but no meds  . History of migraine    last one a week ago  . History of panic attacks    doesn't take meds  . Hyperlipidemia  was on meds but has been off for yrs-told much improved  . Migraine   . Muscle spasm    takes Flexeril daily as needed  . 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 PONV (postoperative nausea and vomiting)   . Seasonal allergies    takes Claritin daily  . UTI (lower urinary tract infection)    treated in May with antibiotic  . Weakness    numbness and tingling down right left   Past Surgical History:  Procedure Laterality Date  . ABDOMINAL HYSTERECTOMY    . BILATERAL FOOT SURGERY    . COLONOSCOPY    . EYE SURGERY Left   . knot removed from left side of neck    . MYOMECTOMY     to remove fibroids    FAMILY  HISTORY Family History  Problem Relation Age of Onset  . Breast cancer Mother 62    SOCIAL HISTORY Social History   Tobacco Use  . Smoking status: Never Smoker  . Smokeless tobacco: Never Used  Substance Use Topics  . Alcohol use: Yes    Alcohol/week: 0.0 standard drinks    Comment: occasionally  . Drug use: No         OPHTHALMIC EXAM:  Base Eye Exam    Visual Acuity (ETDRS)      Right Left   Dist [place] 20/25 +1 20/40 +2   Dist ph [place]  20/25 +1       Tonometry (Tonopen, 3:54 PM)      Right Left   Pressure 17 20       Pupils      Pupils Dark Light Shape React APD   Right PERRL 4 3 Round Slow None   Left PERRL 4 3 Round Slow None       Visual Fields (Counting fingers)      Left Right    Full Full       Extraocular Movement      Right Left    Full Full       Neuro/Psych    Oriented x3: Yes   Mood/Affect: Normal       Dilation    Left eye: 1.0% Mydriacyl, 2.5% Phenylephrine @ 4:00 PM        Slit Lamp and Fundus Exam    External Exam      Right Left   External Normal Normal       Slit Lamp Exam      Right Left   Lids/Lashes  Normal   Conjunctiva/Sclera  1+ injection   Cornea  Temporal keratotomy incision well closed 3 interrupted 10-0 nylon's tags exposed, striae   Anterior Chamber  Deep, quiet   Iris  Round pharmacologically dilated   Lens  Centered posterior chamber intraocular lens   Anterior Vitreous  Clear       Fundus Exam      Right Left   Posterior Vitreous  clear  view through cornea   Disc  Normal   C/D Ratio  .1   Macula  Normal   Vessels  attached   Periphery  Normal          IMAGING AND PROCEDURES  Imaging and Procedures for 04/23/20  OCT, Retina - OU - Both Eyes       Right Eye Quality was good. Scan locations included subfoveal. Central Foveal Thickness: 299. Progression has been stable. Findings include normal foveal contour.   Left Eye Quality  was good. Scan locations included subfoveal. Central Foveal  Thickness: 316. Progression has been stable. Findings include normal foveal contour.                 ASSESSMENT/PLAN:  Aphakia, left Condition improving, now pseudophakia  Corneal edema of left eye Clear with minor corneal striae improving dramatically since last visit  Pseudophakia, left eye I discussed with the patient that her condition of the left eye continues to improve nicely with uncorrected visual acuity currently at 20/40 with pinhole acuity of 20/25 which would be the best possible acuity.  I explained to Ms. Amber Riley that she will need to follow-up with Dr. Gershon Crane or someone of general ophthalmology background as they have the proper utensils, measuring devices to determine the best possible visual assistance that she may or may not need in the future to assist the left eye.      ICD-10-CM   1. Dislocated intraocular lens, initial encounter  T85.22XA OCT, Retina - OU - Both Eyes  2. Aphakia, left  H27.02   3. Corneal edema of left eye  H18.20   4. Pseudophakia, left eye  Z96.1     1.  Patient is to complete the current bottle of ofloxacin, antibiotic eyedrop  Patient is to continue on topical prednisolone acetate also 1 drop left eye 4 times daily.  2.  Patient is encouraged and suggested to return to see Dr. Rutherford Guys so in the coming 2 weeks can help her with what visual assistance if any are required.  3.  I answered all of the patient's questions.  She asked me what percentage of people get all the vision back.  I explained that this is not a possible number to describe but that I am delighted to see that her vision has continued to improve very nicely over the postoperative period.  I explained that the final visual acuity is likely be determined by refractive measurements as her current pinhole acuity of 20/25 is very near perfect, but yet may require refractive assistance for which the evaluation of Dr. Gershon Crane is critical.  I answered all of her  questions, and her reply was my answers were "crazy"  I explained that from a medical standpoint I gave her the best possible answer but I cannot in any way predict the ongoing outcome of her current medical state.  I expressed delight at the ongoing and continued progress in her visual acuity recovery.  I explained that I have done all that I surgically can do from a retina standpoint to stabilize and pseudophakic implant in the eye.  At This juncture she is to return to her primary general ophthalmologist Dr. Rutherford Guys    Ophthalmic Meds Ordered this visit:  No orders of the defined types were placed in this encounter.      Return if symptoms worsen or fail to improve, for We will schedule follow-up with Dr. Rutherford Guys for refractive assistance and monitoring .  There are no Patient Instructions on file for this visit.   Explained the diagnoses, plan, and follow up with the patient and they expressed understanding.  Patient expressed understanding of the importance of proper follow up care.   Clent Demark Humaira Sculley M.D. Diseases & Surgery of the Retina and Vitreous Retina & Diabetic [place] 04/23/20     Abbreviations: M myopia (nearsighted); A astigmatism; H hyperopia (farsighted); P presbyopia; Mrx spectacle prescription;  CTL contact lenses; OD right eye; OS left eye; OU both eyes  XT exotropia; ET esotropia; PEK punctate epithelial keratitis; PEE punctate epithelial erosions; DES dry eye syndrome; MGD meibomian gland dysfunction; ATs artificial tears; PFAT's preservative free artificial tears; NSC nuclear sclerotic cataract; PSC posterior subcapsular cataract; ERM epi-retinal membrane; PVD posterior vitreous detachment; RD retinal detachment; DM diabetes mellitus; DR diabetic retinopathy; NPDR non-proliferative diabetic retinopathy; PDR proliferative diabetic retinopathy; CSME clinically significant macular edema; DME diabetic macular edema; dbh dot blot hemorrhages; CWS cotton wool  spot; POAG primary open angle glaucoma; C/D cup-to-disc ratio; HVF humphrey visual field; GVF goldmann visual field; OCT optical coherence tomography; IOP intraocular pressure; BRVO Branch retinal vein occlusion; CRVO central retinal vein occlusion; CRAO central retinal artery occlusion; BRAO branch retinal artery occlusion; RT retinal tear; SB scleral buckle; PPV pars plana vitrectomy; VH Vitreous hemorrhage; PRP panretinal laser photocoagulation; IVK intravitreal kenalog; VMT vitreomacular traction; MH Macular hole;  NVD neovascularization of the disc; NVE neovascularization elsewhere; AREDS age related eye disease study; ARMD age related macular degeneration; POAG primary open angle glaucoma; EBMD epithelial/anterior basement membrane dystrophy; ACIOL anterior chamber intraocular lens; IOL intraocular lens; PCIOL posterior chamber intraocular lens; Phaco/IOL phacoemulsification with intraocular lens placement; PRK photorefractive keratectomy; LASIK laser assisted in situ keratomileusis; HTN hypertension; DM diabetes mellitus; COPD chronic obstructive pulmonary disease

## 2020-04-23 NOTE — Assessment & Plan Note (Signed)
I discussed with the patient that her condition of the left eye continues to improve nicely with uncorrected visual acuity currently at 20/40 with pinhole acuity of 20/25 which would be the best possible acuity.  I explained to Amber Riley that she will need to follow-up with Dr. Gershon Crane or someone of general ophthalmology background as they have the proper utensils, measuring devices to determine the best possible visual assistance that she may or may not need in the future to assist the left eye.

## 2020-04-23 NOTE — Assessment & Plan Note (Signed)
Condition improving, now pseudophakia

## 2020-04-23 NOTE — Assessment & Plan Note (Signed)
Clear with minor corneal striae improving dramatically since last visit

## 2020-05-15 ENCOUNTER — Ambulatory Visit
Admission: RE | Admit: 2020-05-15 | Discharge: 2020-05-15 | Disposition: A | Discharge status: Home / Self Care | Payer: BC Managed Care – PPO | Source: Ambulatory Visit | Attending: Physician Assistant | Admitting: Physician Assistant

## 2020-05-15 ENCOUNTER — Other Ambulatory Visit: Payer: Self-pay

## 2020-05-15 DIAGNOSIS — Z1231 Encounter for screening mammogram for malignant neoplasm of breast: Secondary | ICD-10-CM

## 2020-05-16 ENCOUNTER — Other Ambulatory Visit: Payer: Self-pay | Admitting: Physician Assistant

## 2020-05-16 DIAGNOSIS — R928 Other abnormal and inconclusive findings on diagnostic imaging of breast: Secondary | ICD-10-CM

## 2020-05-30 ENCOUNTER — Other Ambulatory Visit: Payer: Self-pay | Admitting: Physician Assistant

## 2020-05-30 ENCOUNTER — Other Ambulatory Visit: Payer: Self-pay

## 2020-05-30 ENCOUNTER — Ambulatory Visit
Admission: RE | Admit: 2020-05-30 | Discharge: 2020-05-30 | Disposition: A | Discharge status: Home / Self Care | Payer: BC Managed Care – PPO | Source: Ambulatory Visit | Attending: Physician Assistant | Admitting: Physician Assistant

## 2020-05-30 DIAGNOSIS — R928 Other abnormal and inconclusive findings on diagnostic imaging of breast: Secondary | ICD-10-CM

## 2020-06-06 ENCOUNTER — Encounter (INDEPENDENT_AMBULATORY_CARE_PROVIDER_SITE_OTHER): Payer: Self-pay | Admitting: Ophthalmology

## 2020-06-06 ENCOUNTER — Ambulatory Visit (INDEPENDENT_AMBULATORY_CARE_PROVIDER_SITE_OTHER): Payer: BC Managed Care – PPO | Admitting: Ophthalmology

## 2020-06-06 ENCOUNTER — Other Ambulatory Visit: Payer: Self-pay

## 2020-06-06 DIAGNOSIS — T8522XA Displacement of intraocular lens, initial encounter: Secondary | ICD-10-CM

## 2020-06-06 DIAGNOSIS — H2702 Aphakia, left eye: Secondary | ICD-10-CM

## 2020-06-06 DIAGNOSIS — T1592XA Foreign body on external eye, part unspecified, left eye, initial encounter: Secondary | ICD-10-CM | POA: Insufficient documentation

## 2020-06-06 HISTORY — DX: Foreign body on external eye, part unspecified, left eye, initial encounter: T15.92XA

## 2020-06-06 NOTE — Assessment & Plan Note (Signed)
Intraocular lens exchanged and removed and replaced with three-piece IOL now in the sulcus with excellent visual acuity

## 2020-06-06 NOTE — Progress Notes (Signed)
06/06/2020     CHIEF COMPLAINT Patient presents for Post-op Follow-up (Pt states it feels like there is a stitch in OS. Pt states OS has had discharge. Onset was 05/07/20. Pt states vision is still not great. Pt c/o trouble driving at night due to the lights being bright.)   HISTORY OF PRESENT ILLNESS: Amber Riley is a 60 y.o. female who presents to the clinic today for:   HPI    Post-op Follow-up    In left eye.  Discomfort includes foreign body sensation and discharge.  Vision is stable.  I, the attending physician,  performed the HPI with the patient and updated documentation appropriately. Additional comments: Pt states it feels like there is a stitch in OS. Pt states OS has had discharge. Onset was 05/07/20. Pt states vision is still not great. Pt c/o trouble driving at night due to the lights being bright.       Last edited by Tilda Franco on 06/06/2020  9:04 AM. (History)      Referring physician: Lennie Odor, [place],   [id number]  HISTORICAL INFORMATION:   Selected notes from the MEDICAL RECORD NUMBER       CURRENT MEDICATIONS: Current Outpatient Medications (Ophthalmic Drugs)  Medication Sig  . hydroxypropyl methylcellulose / hypromellose (ISOPTO TEARS / GONIOVISC) 2.5 % ophthalmic solution Place 1 drop into both eyes as needed for dry eyes. ARTIFICIAL TEARS.   No current facility-administered medications for this visit. (Ophthalmic Drugs)   Current Outpatient Medications (Other)  Medication Sig  . atorvastatin (LIPITOR) 10 MG tablet Take 10 mg every evening by mouth.  . cyclobenzaprine (FLEXERIL) 10 MG tablet Take 10 mg by mouth at bedtime.  . diclofenac (VOLTAREN) 50 MG EC tablet Take 50 mg by mouth 2 (two) times daily with a meal.  . estradiol (ESTRACE) 1 MG tablet Take 1 mg by mouth daily.  . fluticasone (FLONASE) 50 MCG/ACT nasal spray Place 2 sprays into both nostrils daily.   . methylphenidate (RITALIN) 10 MG  tablet 1 in AM po, prn after lunchtime. (Patient not taking: Reported on 02/23/2019)  . naproxen sodium (ANAPROX) 220 MG tablet Take 220 mg by mouth daily as needed (for pain). ALEVE.  Marland Kitchen omeprazole (PRILOSEC) 20 MG capsule Take 1 capsule (20 mg total) by mouth daily. (Patient taking differently: Take 20 mg by mouth daily as needed (heartburn). )  . Solriamfetol HCl (SUNOSI) 150 MG TABS Take one tablet in am.  [place] [id number] LOT [id number] exp 03/2020. (one pack of 7 tablets)  . SUNOSI 150 MG TABS Take 1 tablet by mouth daily.  Marland Kitchen venlafaxine XR (EFFEXOR-XR) 75 MG 24 hr capsule TAKE 1 CAPSULE BY MOUTH ONCE DAILY WITH FOOD FOR 30 DAYS   No current facility-administered medications for this visit. (Other)      REVIEW OF SYSTEMS:    ALLERGIES No Known Allergies  PAST MEDICAL HISTORY Past Medical History:  Diagnosis Date  . Anemia    hx of-was on iron pills yrs ago  . 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 Chronic back pain    DDD/scoliosis  . Constipation    takes OTC stool softener,laxatives,or enema  . Depression    not on any meds  . GERD (gastroesophageal reflux disease)    occasionally but no meds  . History of migraine    last one a week ago  . History of panic attacks    doesn't take meds  . Hyperlipidemia  was on meds but has been off for yrs-told much improved  . Migraine   . Muscle spasm    takes Flexeril daily as needed  . 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 PONV (postoperative nausea and vomiting)   . Seasonal allergies    takes Claritin daily  . UTI (lower urinary tract infection)    treated in May with antibiotic  . Weakness    numbness and tingling down right left   Past Surgical History:  Procedure Laterality Date  . ABDOMINAL HYSTERECTOMY    . BILATERAL FOOT SURGERY    . COLONOSCOPY    . EYE SURGERY Left   . knot removed from left side of neck    . MYOMECTOMY     to remove fibroids    FAMILY HISTORY Family History   Problem Relation Age of Onset  . Breast cancer Mother 8    SOCIAL HISTORY Social History   Tobacco Use  . Smoking status: Never Smoker  . Smokeless tobacco: Never Used  Substance Use Topics  . Alcohol use: Yes    Alcohol/week: 0.0 standard drinks    Comment: occasionally  . Drug use: No         OPHTHALMIC EXAM: Base Eye Exam    Visual Acuity (Snellen - Linear)      Right Left   Dist [place] 20/20 -1 20/30 -2       Tonometry (Tonopen, 9:08 AM)      Right Left   Pressure 17 19       Neuro/Psych    Oriented x3: Yes   Mood/Affect: Normal       Dilation    Left eye: 1.0% Mydriacyl, 2.5% Phenylephrine @ 9:08 AM        Slit Lamp and Fundus Exam    External Exam      Right Left   External Normal Normal       Slit Lamp Exam      Right Left   Lids/Lashes  Normal   Conjunctiva/Sclera  1+ injection   Cornea  Temporal keratotomy incision well closed 3 interrupted 10-0 nylon's tags exposed, striae, 2 suture tags exposed, these shall be removed today   Anterior Chamber  Deep, quiet   Iris  Round pharmacologically dilated   Lens  Centered posterior chamber intraocular lens   Anterior Vitreous  Clear       Fundus Exam      Right Left   Posterior Vitreous  clear  view through cornea   Disc  Normal   C/D Ratio  .1   Macula  Normal   Vessels  attached   Periphery  Normal          IMAGING AND PROCEDURES  Imaging and Procedures for 06/06/20           ASSESSMENT/PLAN:  Aphakia, left Condition resolved aphakia with insertion IOL into the sulcus by December 2021  Dislocated intraocular lens, initial encounter Intraocular lens exchanged and removed and replaced with three-piece IOL now in the sulcus with excellent visual acuity      ICD-10-CM   1. Dislocated intraocular lens, initial encounter  T85.22XA OCT, Retina - OU - Both Eyes  2. Aphakia, left  H27.02   3. Foreign body of left eye, initial encounter  T15.92XA     1. Left eye, now with improved  visual functioning.  2 sutures tags exposed with foreign body sensation shall be removed and  were removed today.  Topical antibiotics, tobramycin x2 were applied after topical proparacaine to the left eye.  Using a lid speculum, 2 sutures were cut with McPherson scissors followed thereafter by removal with forceps each of months sterile. Topical antibiotic drops were reapplied. A third suture was left in place as it is not exposed after today. I reassured the patient the third suture can remain there forever or can be removed at any time should a foreign body sensation develop in her left eye.  2.  3.  Ophthalmic Meds Ordered this visit:  No orders of the defined types were placed in this encounter.      No follow-ups on file.  There are no Patient Instructions on file for this visit.   Explained the diagnoses, plan, and follow up with the patient and they expressed understanding.  Patient expressed understanding of the importance of proper follow up care.   Clent Demark Luis Nickles M.D. Diseases & Surgery of the Retina and Vitreous [facility]ina [facility] Diabetic [place] 06/06/20     Abbreviations: M myopia (nearsighted); A astigmatism; H hyperopia (farsighted); P presbyopia; Mrx spectacle prescription;  CTL contact lenses; OD right eye; OS left eye; OU both eyes  XT exotropia; ET esotropia; PEK punctate epithelial keratitis; PEE punctate epithelial erosions; DES dry eye syndrome; MGD meibomian gland dysfunction; ATs artificial tears; PFAT's preservative free artificial tears; [place] nuclear sclerotic cataract; PSC posterior subcapsular cataract; ERM epi-retinal membrane; PVD posterior vitreous detachment; RD retinal detachment; DM diabetes mellitus; DR diabetic retinopathy; NPDR non-proliferative diabetic retinopathy; PDR proliferative diabetic retinopathy; CSME clinically significant macular edema; DME diabetic macular edema; dbh dot blot hemorrhages; CWS cotton wool spot; POAG primary open angle  glaucoma; C/D cup-to-disc ratio; HVF humphrey visual field; GVF goldmann visual field; OCT optical coherence tomography; IOP intraocular pressure; BRVO Branch retinal vein occlusion; CRVO central retinal vein occlusion; CRAO central retinal artery occlusion; BRAO branch retinal artery occlusion; RT retinal tear; SB scleral buckle; PPV pars plana vitrectomy; VH Vitreous hemorrhage; PRP panretinal laser photocoagulation; IVK intravitreal kenalog; VMT vitreomacular traction; MH Macular hole;  NVD neovascularization of the disc; NVE neovascularization elsewhere; AREDS age related eye disease study; ARMD age related macular degeneration; POAG primary open angle glaucoma; EBMD epithelial/anterior basement membrane dystrophy; ACIOL anterior chamber intraocular lens; IOL intraocular lens; PCIOL posterior chamber intraocular lens; Phaco/IOL phacoemulsification with intraocular lens placement; [place] photorefractive keratectomy; LASIK laser assisted in situ keratomileusis; HTN hypertension; DM diabetes mellitus; COPD chronic obstructive pulmonary disease

## 2020-06-06 NOTE — Assessment & Plan Note (Signed)
Condition resolved aphakia with insertion IOL into the sulcus by December 2021

## 2020-06-07 ENCOUNTER — Ambulatory Visit
Admission: RE | Admit: 2020-06-07 | Discharge: 2020-06-07 | Disposition: A | Discharge status: Home / Self Care | Payer: BC Managed Care – PPO | Source: Ambulatory Visit | Attending: Physician Assistant | Admitting: Physician Assistant

## 2020-06-07 ENCOUNTER — Other Ambulatory Visit (HOSPITAL_COMMUNITY): Payer: Self-pay | Admitting: Diagnostic Radiology

## 2020-06-07 DIAGNOSIS — R928 Other abnormal and inconclusive findings on diagnostic imaging of breast: Secondary | ICD-10-CM

## 2020-06-07 HISTORY — PX: BREAST BIOPSY: SHX20

## 2020-06-19 ENCOUNTER — Other Ambulatory Visit: Payer: Self-pay

## 2020-06-19 ENCOUNTER — Ambulatory Visit (INDEPENDENT_AMBULATORY_CARE_PROVIDER_SITE_OTHER): Payer: BC Managed Care – PPO | Admitting: Ophthalmology

## 2020-06-19 ENCOUNTER — Encounter (INDEPENDENT_AMBULATORY_CARE_PROVIDER_SITE_OTHER): Payer: Self-pay | Admitting: Ophthalmology

## 2020-06-19 DIAGNOSIS — H1820 Unspecified corneal edema: Secondary | ICD-10-CM

## 2020-06-19 DIAGNOSIS — T8522XA Displacement of intraocular lens, initial encounter: Secondary | ICD-10-CM

## 2020-06-19 DIAGNOSIS — H35352 Cystoid macular degeneration, left eye: Secondary | ICD-10-CM

## 2020-06-19 HISTORY — DX: Cystoid macular degeneration, left eye: H35.352

## 2020-06-19 NOTE — Progress Notes (Signed)
06/19/2020     CHIEF COMPLAINT Patient presents for Post-op Follow-up (Burning sensation OS. PO OS Sx 04/03/20///Pt reports blurry vision and "halos" OS, pt also reports seeing a "teardrop that drops down in vision and goes to the left of OS. Pt reports some burning and stinging, and a FB sensation OS. Light sensitivity OS.)   HISTORY OF PRESENT ILLNESS: Amber Riley is a 60 y.o. female who presents to the clinic today for:   HPI    Post-op Follow-up    In left eye.  Discomfort includes foreign body sensation. Additional comments: Burning sensation OS. PO OS Sx 04/03/20   Pt reports blurry vision and "halos" OS, pt also reports seeing a "teardrop that drops down in vision and goes to the left of OS. Pt reports some burning and stinging, and a FB sensation OS. Light sensitivity OS.       Last edited by Nichola Sizer D on 06/19/2020  3:17 PM. (History)      Referring physician: Lennie Odor, [place],  [place] [address]  HISTORICAL INFORMATION:   Selected notes from the MEDICAL RECORD NUMBER       CURRENT MEDICATIONS: Current Outpatient Medications (Ophthalmic Drugs)  Medication Sig  . hydroxypropyl methylcellulose / hypromellose (ISOPTO TEARS / GONIOVISC) 2.5 % ophthalmic solution Place 1 drop into both eyes as needed for dry eyes. ARTIFICIAL TEARS.   No current facility-administered medications for this visit. (Ophthalmic Drugs)   Current Outpatient Medications (Other)  Medication Sig  . atorvastatin (LIPITOR) 10 MG tablet Take 10 mg every evening by mouth.  . cyclobenzaprine (FLEXERIL) 10 MG tablet Take 10 mg by mouth at bedtime.  . diclofenac (VOLTAREN) 50 MG EC tablet Take 50 mg by mouth 2 (two) times daily with a meal.  . estradiol (ESTRACE) 1 MG tablet Take 1 mg by mouth daily.  . fluticasone (FLONASE) 50 MCG/ACT nasal spray Place 2 sprays into both nostrils daily.   . methylphenidate (RITALIN) 10 MG tablet 1 in AM po, prn  after lunchtime. (Patient not taking: Reported on 02/23/2019)  . naproxen sodium (ANAPROX) 220 MG tablet Take 220 mg by mouth daily as needed (for pain). ALEVE.  Marland Kitchen omeprazole (PRILOSEC) 20 MG capsule Take 1 capsule (20 mg total) by mouth daily. (Patient taking differently: Take 20 mg by mouth daily as needed (heartburn). )  . Solriamfetol HCl (SUNOSI) 150 MG TABS Take one tablet in am.  [place] [id number] LOT [id number] exp 03/2020. (one pack of 7 tablets)  . SUNOSI 150 MG TABS Take 1 tablet by mouth daily.  Marland Kitchen venlafaxine XR (EFFEXOR-XR) 75 MG 24 hr capsule TAKE 1 CAPSULE BY MOUTH ONCE DAILY WITH FOOD FOR 30 DAYS   No current facility-administered medications for this visit. (Other)      REVIEW OF SYSTEMS:    ALLERGIES No Known Allergies  PAST MEDICAL HISTORY Past Medical History:  Diagnosis Date  . Anemia    hx of-was on iron pills yrs ago  . 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 Chronic back pain    DDD/scoliosis  . Constipation    takes OTC stool softener,laxatives,or enema  . Depression    not on any meds  . GERD (gastroesophageal reflux disease)    occasionally but no meds  . History of migraine    last one a week ago  . History of panic attacks    doesn't take meds  . Hyperlipidemia    was on meds but has been  off for yrs-told much improved  . Migraine   . Muscle spasm    takes Flexeril daily as needed  . 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 PONV (postoperative nausea and vomiting)   . Seasonal allergies    takes Claritin daily  . UTI (lower urinary tract infection)    treated in May with antibiotic  . Weakness    numbness and tingling down right left   Past Surgical History:  Procedure Laterality Date  . ABDOMINAL HYSTERECTOMY    . BILATERAL FOOT SURGERY    . COLONOSCOPY    . EYE SURGERY Left   . knot removed from left side of neck    . MYOMECTOMY     to remove fibroids    FAMILY HISTORY Family History  Problem Relation Age of  Onset  . Breast cancer Mother 32    SOCIAL HISTORY Social History   Tobacco Use  . Smoking status: Never Smoker  . Smokeless tobacco: Never Used  Substance Use Topics  . Alcohol use: Yes    Alcohol/week: 0.0 standard drinks    Comment: occasionally  . Drug use: No         OPHTHALMIC EXAM:  Base Eye Exam    Visual Acuity (ETDRS)      Right Left   Dist [place] 20/20 -2 20/30 -1   Dist ph [place]  NI       Tonometry (Tonopen, 3:18 PM)      Right Left   Pressure 16 20       Pupils      Pupils Dark Light Shape React APD   Right PERRL 4 3 Round Slow None   Left PERRL 4 3 Round Slow None       Visual Fields (Counting fingers)      Left Right    Full Full       Extraocular Movement      Right Left    Full Full       Neuro/Psych    Oriented x3: Yes   Mood/Affect: Normal       Dilation    Left eye: 1.0% Mydriacyl, 2.5% Phenylephrine @ 3:18 PM        Slit Lamp and Fundus Exam    External Exam      Right Left   External Normal Normal       Slit Lamp Exam      Right Left   Lids/Lashes  Normal   Conjunctiva/Sclera  1+ injection   Cornea  Temporal keratotomy incision well closed 3 interrupted 10-0 nylon's tags exposed, striae, 2 suture tags exposed, these shall be removed today   Anterior Chamber  Deep, quiet   Iris  Round pharmacologically dilated   Lens  Slightly decentered, inferiorly,  posterior chamber intraocular lens   Anterior Vitreous  pigment       Fundus Exam      Right Left   Posterior Vitreous  clear   Disc  Normal   C/D Ratio  .1   Macula  Cystoid macular edema not detected   Vessels  attached   Periphery  Normal          IMAGING AND PROCEDURES  Imaging and Procedures for 06/19/20  OCT, Retina - OU - Both Eyes       Right Eye Quality was good. Scan locations included subfoveal. Central Foveal Thickness: 300. Progression has been stable. Findings include normal foveal contour.  Left Eye Quality was good. Scan locations included  subfoveal. Central Foveal Thickness: 403. Findings include cystoid macular edema.   Notes CME of the left eye, has increased slightly since last visit, this could be causing her symptoms of blurred vision                ASSESSMENT/PLAN:  Corneal edema of left eye Resolved centrally, no therapy required  Cystoid macular edema, left eye The nature of cystoid macular edema including causes, exacerbating factors, and treatments including steroid and nonsteroidal anti-inflammatory drops, periocular injections of steroids, and intravitreal injections of steroids were reviewed. An informational brochure was offered.  The patient's questions were answered. The potential side effects of the various treatments were reviewed including the potential for intraocular pressure rise with steroid treatments.  I often use topical NSAIDS alone or in conjunction with periocular steroids to resolve this condition.   Patient instructed not to compress or "rub" on the eye.  More rarely, surgery may be considered if the condition is not improving.  Will offer patient topical PROLENSA, sample to use twice daily for 1 week and then decrease the dose to once daily thereafter and we will asked the patient return follow-up in 4 to 5 weeks to look to see if the cystoid macular edema is improving      ICD-10-CM   1. Dislocated intraocular lens, initial encounter  T85.22XA OCT, Retina - OU - Both Eyes  2. Corneal edema of left eye  H18.20   3. Cystoid macular edema, left eye  H35.352     1.  Upon follow-up no dilation left eye next, OCT however 2.  Patient reminded to use the PROLENSA twice 1 drop twice daily only for 7 days prior to   decreasing to 1 drop left eye  3.  Sample bottles were provided, 2 boxes If CME in fact does improve on this regimen over the next 4 weeks, we will continue to treat with generic ketorolac 1 drop twice daily or once daily long-term to prevent ongoing CME  Ophthalmic Meds Ordered  this visit:  No orders of the defined types were placed in this encounter.      Return in about 4 weeks (around 07/17/2020) for POST OP, OS, OCT, NO DILATE.  Patient Instructions  Pt to contact our office with any significant changes to vision.    Explained the diagnoses, plan, and follow up with the patient and they expressed understanding.  Patient expressed understanding of the importance of proper follow up care.   Clent Demark Nyron Mozer M.D. Diseases & Surgery of the Retina and Vitreous Retina & Diabetic [place] 06/19/20     Abbreviations: M myopia (nearsighted); A astigmatism; H hyperopia (farsighted); P presbyopia; Mrx spectacle prescription;  CTL contact lenses; OD right eye; OS left eye; OU both eyes  XT exotropia; ET esotropia; PEK punctate epithelial keratitis; PEE punctate epithelial erosions; DES dry eye syndrome; MGD meibomian gland dysfunction; ATs artificial tears; PFAT's preservative free artificial tears; [place] nuclear sclerotic cataract; PSC posterior subcapsular cataract; ERM epi-retinal membrane; PVD posterior vitreous detachment; RD retinal detachment; DM diabetes mellitus; DR diabetic retinopathy; NPDR non-proliferative diabetic retinopathy; PDR proliferative diabetic retinopathy; CSME clinically significant macular edema; DME diabetic macular edema; dbh dot blot hemorrhages; CWS cotton wool spot; POAG primary open angle glaucoma; C/D cup-to-disc ratio; HVF humphrey visual field; GVF goldmann visual field; OCT optical coherence tomography; IOP intraocular pressure; BRVO Branch retinal vein occlusion; CRVO central retinal vein occlusion; CRAO central retinal artery occlusion;  BRAO branch retinal artery occlusion; RT retinal tear; SB scleral buckle; PPV pars plana vitrectomy; VH Vitreous hemorrhage; PRP panretinal laser photocoagulation; IVK intravitreal kenalog; VMT vitreomacular traction; MH Macular hole;  NVD neovascularization of the disc; NVE neovascularization elsewhere;  AREDS age related eye disease study; ARMD age related macular degeneration; POAG primary open angle glaucoma; EBMD epithelial/anterior basement membrane dystrophy; ACIOL anterior chamber intraocular lens; IOL intraocular lens; PCIOL posterior chamber intraocular lens; Phaco/IOL phacoemulsification with intraocular lens placement; [place] photorefractive keratectomy; LASIK laser assisted in situ keratomileusis; HTN hypertension; DM diabetes mellitus; COPD chronic obstructive pulmonary disease

## 2020-06-19 NOTE — Patient Instructions (Signed)
Pt to contact our office with any significant changes to vision.

## 2020-06-19 NOTE — Assessment & Plan Note (Signed)
The nature of cystoid macular edema including causes, exacerbating factors, and treatments including steroid and nonsteroidal anti-inflammatory drops, periocular injections of steroids, and intravitreal injections of steroids were reviewed. An informational brochure was offered.  The patient's questions were answered. The potential side effects of the various treatments were reviewed including the potential for intraocular pressure rise with steroid treatments.  I often use topical NSAIDS alone or in conjunction with periocular steroids to resolve this condition.   Patient instructed not to compress or "rub" on the eye.  More rarely, surgery may be considered if the condition is not improving.  Will offer patient topical PROLENSA, sample to use twice daily for 1 week and then decrease the dose to once daily thereafter and we will asked the patient return follow-up in 4 to 5 weeks to look to see if the cystoid macular edema is improving

## 2020-06-19 NOTE — Assessment & Plan Note (Signed)
Resolved centrally, no therapy required

## 2020-07-08 ENCOUNTER — Other Ambulatory Visit: Payer: Self-pay

## 2020-07-08 ENCOUNTER — Encounter (INDEPENDENT_AMBULATORY_CARE_PROVIDER_SITE_OTHER): Payer: BC Managed Care – PPO | Admitting: Ophthalmology

## 2020-07-08 ENCOUNTER — Encounter (INDEPENDENT_AMBULATORY_CARE_PROVIDER_SITE_OTHER): Payer: Self-pay

## 2020-07-18 ENCOUNTER — Other Ambulatory Visit: Payer: Self-pay

## 2020-07-18 ENCOUNTER — Encounter (INDEPENDENT_AMBULATORY_CARE_PROVIDER_SITE_OTHER): Payer: Self-pay | Admitting: Ophthalmology

## 2020-07-18 ENCOUNTER — Ambulatory Visit (INDEPENDENT_AMBULATORY_CARE_PROVIDER_SITE_OTHER): Payer: BC Managed Care – PPO | Admitting: Ophthalmology

## 2020-07-18 DIAGNOSIS — H35352 Cystoid macular degeneration, left eye: Secondary | ICD-10-CM

## 2020-07-18 DIAGNOSIS — H4052X0 Glaucoma secondary to other eye disorders, left eye, stage unspecified: Secondary | ICD-10-CM

## 2020-07-18 DIAGNOSIS — H4052X1 Glaucoma secondary to other eye disorders, left eye, mild stage: Secondary | ICD-10-CM | POA: Diagnosis not present

## 2020-07-18 HISTORY — DX: Glaucoma secondary to other eye disorders, left eye, stage unspecified: H40.52X0

## 2020-07-18 MED ORDER — TIMOLOL MALEATE 0.5 % OP SOLN: 1.0000 [drp] | Freq: Two times a day (BID) | OPHTHALMIC | 1 refills | Status: DC

## 2020-07-18 MED ORDER — KETOROLAC TROMETHAMINE 0.5 % OP SOLN: 1.0000 [drp] | Freq: Two times a day (BID) | OPHTHALMIC | 0 refills | Status: DC

## 2020-07-18 MED ORDER — BRIMONIDINE TARTRATE 0.15 % OP SOLN: 1.0000 [drp] | Freq: Three times a day (TID) | OPHTHALMIC | 1 refills | Status: DC

## 2020-07-18 NOTE — Assessment & Plan Note (Signed)
We will commence with topical Timoptic 0.5% 1 drop left eye twice daily in addition to Alphagan 0.15% 1 drop left eye 3 times daily to lower the intraocular pressure.

## 2020-07-18 NOTE — Assessment & Plan Note (Signed)
OS with pseudophakic CME, improved on topical PROLENSA Which has been in use for the last 4 weeks.  Patient reports running out of the medication in the last several days and is currently not using the medication

## 2020-07-18 NOTE — Progress Notes (Signed)
07/18/2020     CHIEF COMPLAINT Patient presents for Retina Follow Up (4 Wk OS F/U//Pt c/o continued cloudiness OS. Pt c/o continued fb sensation OS. Pt wants to know IOL information. )   HISTORY OF PRESENT ILLNESS: Amber Riley is a 60 y.o. female who presents to the clinic today for:   HPI    Retina Follow Up    Patient presents with  Other.  In left eye.  This started 4 weeks ago.  Severity is mild.  Duration of 4 weeks.  Since onset it is stable. Additional comments: 4 Wk OS F/U  Pt c/o continued cloudiness OS. Pt c/o continued fb sensation OS. Pt wants to know IOL information.        Last edited by Rockie Neighbours, [place] on 07/18/2020  1:40 PM. (History)      Referring physician: Lennie Odor, [address] [place],  [place] [id number]  HISTORICAL INFORMATION:   Selected notes from the MEDICAL RECORD NUMBER       CURRENT MEDICATIONS: Current Outpatient Medications (Ophthalmic Drugs)  Medication Sig  . hydroxypropyl methylcellulose / hypromellose (ISOPTO TEARS / GONIOVISC) 2.5 % ophthalmic solution Place 1 drop into both eyes as needed for dry eyes. ARTIFICIAL TEARS.   No current facility-administered medications for this visit. (Ophthalmic Drugs)   Current Outpatient Medications (Other)  Medication Sig  . atorvastatin (LIPITOR) 10 MG tablet Take 10 mg every evening by mouth.  . cyclobenzaprine (FLEXERIL) 10 MG tablet Take 10 mg by mouth at bedtime.  . diclofenac (VOLTAREN) 50 MG EC tablet Take 50 mg by mouth 2 (two) times daily with a meal.  . estradiol (ESTRACE) 1 MG tablet Take 1 mg by mouth daily.  . fluticasone (FLONASE) 50 MCG/ACT nasal spray Place 2 sprays into both nostrils daily.   . methylphenidate (RITALIN) 10 MG tablet 1 in AM po, prn after lunchtime. (Patient not taking: Reported on 02/23/2019)  . naproxen sodium (ANAPROX) 220 MG tablet Take 220 mg by mouth daily as needed (for pain). ALEVE.  Marland Kitchen omeprazole (PRILOSEC) 20 MG capsule Take  1 capsule (20 mg total) by mouth daily. (Patient taking differently: Take 20 mg by mouth daily as needed (heartburn). )  . Solriamfetol HCl (SUNOSI) 150 MG TABS Take one tablet in am.  [place] [id number] LOT [id number] exp 03/2020. (one pack of 7 tablets)  . SUNOSI 150 MG TABS Take 1 tablet by mouth daily.  Marland Kitchen venlafaxine XR (EFFEXOR-XR) 75 MG 24 hr capsule TAKE 1 CAPSULE BY MOUTH ONCE DAILY WITH FOOD FOR 30 DAYS   No current facility-administered medications for this visit. (Other)      REVIEW OF SYSTEMS:    ALLERGIES No Known Allergies  PAST MEDICAL HISTORY Past Medical History:  Diagnosis Date  . Anemia    hx of-was on iron pills yrs ago  . 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 Chronic back pain    DDD/scoliosis  . Constipation    takes OTC stool softener,laxatives,or enema  . Corneal edema of left eye 04/04/2020  . Depression    not on any meds  . GERD (gastroesophageal reflux disease)    occasionally but no meds  . History of migraine    last one a week ago  . History of panic attacks    doesn't take meds  . Hyperlipidemia    was on meds but has been off for yrs-told much improved  . Migraine   . Muscle spasm    takes Flexeril  daily as needed  . 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 PONV (postoperative nausea and vomiting)   . Seasonal allergies    takes Claritin daily  . UTI (lower urinary tract infection)    treated in May with antibiotic  . Weakness    numbness and tingling down right left   Past Surgical History:  Procedure Laterality Date  . ABDOMINAL HYSTERECTOMY    . BILATERAL FOOT SURGERY    . COLONOSCOPY    . EYE SURGERY Left   . knot removed from left side of neck    . MYOMECTOMY     to remove fibroids    FAMILY HISTORY Family History  Problem Relation Age of Onset  . Breast cancer Mother 83    SOCIAL HISTORY Social History   Tobacco Use  . Smoking status: Never Smoker  . Smokeless tobacco: Never Used  Substance Use  Topics  . Alcohol use: Yes    Alcohol/week: 0.0 standard drinks    Comment: occasionally  . Drug use: No         OPHTHALMIC EXAM:  Base Eye Exam    Visual Acuity (ETDRS)      Right Left   Dist [place] 20/20 20/50 +1   Dist ph [place]  20/40 +2       Tonometry (Tonopen, 1:40 PM)      Right Left   Pressure 21 36       Tonometry #2 (Tonopen, 1:45 PM)      Right Left   Pressure  42       Pupils      Dark Light Shape React APD   Right 4 3 Round Slow None   Left 3.5 3.5 Irregular Minimal None       Visual Fields (Counting fingers)      Left Right     Full       Extraocular Movement      Right Left    Full Full       Neuro/Psych    Oriented x3: Yes   Mood/Affect: Normal       Dilation   Defer per GAR        Slit Lamp and Fundus Exam    External Exam      Right Left   External Normal Normal       Slit Lamp Exam      Right Left   Lids/Lashes  Normal   Conjunctiva/Sclera  White and quiet   Cornea  Minor diffuse stromal haze   Anterior Chamber  Deep, quiet   Iris  small pink pupil superotemporal, no vitreous strands noted   Lens  Slightly decentered, inferiorly,  posterior chamber intraocular lens   Anterior Vitreous  pigment          IMAGING AND PROCEDURES  Imaging and Procedures for 07/18/20  OCT, Retina - OU - Both Eyes       Right Eye Quality was good. Scan locations included subfoveal. Central Foveal Thickness: 300. Progression has been stable. Findings include normal foveal contour.   Left Eye Scan locations included subfoveal. Central Foveal Thickness: 324.   Notes OS, near normal foveal contour with resolution of CME noted 1 month prior                ASSESSMENT/PLAN:  Cystoid macular edema, left eye OS with pseudophakic CME, improved on topical PROLENSA Which has been in use for the last 4  weeks.  Patient reports running out of the medication in the last several days and is currently not using the medication    Secondary  glaucoma of left eye We will commence with topical Timoptic 0.5% 1 drop left eye twice daily in addition to Alphagan 0.15% 1 drop left eye 3 times daily to lower the intraocular pressure.      ICD-10-CM   1. Cystoid macular edema, left eye  H35.352 OCT, Retina - OU - Both Eyes  2. Secondary glaucoma of left eye, mild stage  H40.52X1     1.  Summing up, CME has improved and responded nicely to topical NSAID therapy.  Patient has a secondary glaucoma of uncertain etiology as there is no concomitant use of steroids per her report.  Differential diagnosis would include that of particular type of glaucoma but there is none seen or primary positive Schlossman syndrome as her is mild flare but nonetheless we will proceed with topical to drop therapy to lower the intraocular pressure while still maintaining NSAID use with ketorolac twice daily OS to prevent recurrence of CME  2.  I will asked patient to return to see Dr. Rutherford Guys and potentially see glaucoma evaluation for long-term management of the intraocular pressure.  3.  Slightly decentered intraocular lens left eye, yet with excellent visual acuity is good is 20/30 visit 06/19/2020 even with mild CME.  Final acuity should be assessed once the mild corneal edema today resolved with lowering of the intraocular pressure   The patient reports today that she has ongoing heaviness in the eye and also seeing feeling like something is moving in the eye.  I explained the patient to critical importance today of controlling this new finding of elevated intraocular pressure with mild corneal edema leading to a haziness in the vision.  I explained to her that the previous haziness last month was most likely from the swelling in the center of the vision which has improved.  We must thus control the intraocular pressure first and then see what if any residual symptoms are ongoing at that time and I will have her commence with those drops today and then return  for follow-up visit in 4 days to look for an early improvement in the intraocular pressure. Ophthalmic Meds Ordered this visit:  No orders of the defined types were placed in this encounter.      Return in about 4 days (around 07/22/2020) for NO DILATE, IOP check .  There are no Patient Instructions on file for this visit.   Explained the diagnoses, plan, and follow up with the patient and they expressed understanding.  Patient expressed understanding of the importance of proper follow up care.   Clent Demark Rankin M.D. Diseases & Surgery of the Retina and Vitreous Retina & Diabetic [place] 07/18/20     Abbreviations: M myopia (nearsighted); A astigmatism; H hyperopia (farsighted); P presbyopia; Mrx spectacle prescription;  CTL contact lenses; OD right eye; OS left eye; OU both eyes  XT exotropia; ET esotropia; PEK punctate epithelial keratitis; PEE punctate epithelial erosions; DES dry eye syndrome; MGD meibomian gland dysfunction; ATs artificial tears; PFAT's preservative free artificial tears; [place] nuclear sclerotic cataract; PSC posterior subcapsular cataract; ERM epi-retinal membrane; PVD posterior vitreous detachment; RD retinal detachment; DM diabetes mellitus; DR diabetic retinopathy; NPDR non-proliferative diabetic retinopathy; PDR proliferative diabetic retinopathy; CSME clinically significant macular edema; DME diabetic macular edema; dbh dot blot hemorrhages; CWS cotton wool spot; POAG primary open angle glaucoma; C/D cup-to-disc ratio;  HVF humphrey visual field; GVF goldmann visual field; OCT optical coherence tomography; IOP intraocular pressure; BRVO Branch retinal vein occlusion; CRVO central retinal vein occlusion; CRAO central retinal artery occlusion; BRAO branch retinal artery occlusion; RT retinal tear; SB scleral buckle; PPV pars plana vitrectomy; VH Vitreous hemorrhage; PRP panretinal laser photocoagulation; IVK intravitreal kenalog; VMT vitreomacular traction; MH Macular  hole;  NVD neovascularization of the disc; NVE neovascularization elsewhere; AREDS age related eye disease study; ARMD age related macular degeneration; POAG primary open angle glaucoma; EBMD epithelial/anterior basement membrane dystrophy; ACIOL anterior chamber intraocular lens; IOL intraocular lens; PCIOL posterior chamber intraocular lens; Phaco/IOL phacoemulsification with intraocular lens placement; [place] photorefractive keratectomy; LASIK laser assisted in situ keratomileusis; HTN hypertension; DM diabetes mellitus; COPD chronic obstructive pulmonary disease

## 2020-07-22 ENCOUNTER — Encounter (INDEPENDENT_AMBULATORY_CARE_PROVIDER_SITE_OTHER): Payer: Self-pay | Admitting: Ophthalmology

## 2020-07-22 ENCOUNTER — Ambulatory Visit (INDEPENDENT_AMBULATORY_CARE_PROVIDER_SITE_OTHER): Payer: BC Managed Care – PPO | Admitting: Ophthalmology

## 2020-07-22 ENCOUNTER — Other Ambulatory Visit: Payer: Self-pay

## 2020-07-22 DIAGNOSIS — T8522XA Displacement of intraocular lens, initial encounter: Secondary | ICD-10-CM | POA: Diagnosis not present

## 2020-07-22 DIAGNOSIS — H35352 Cystoid macular degeneration, left eye: Secondary | ICD-10-CM

## 2020-07-22 DIAGNOSIS — H4052X1 Glaucoma secondary to other eye disorders, left eye, mild stage: Secondary | ICD-10-CM | POA: Diagnosis not present

## 2020-07-22 NOTE — Progress Notes (Signed)
07/22/2020     CHIEF COMPLAINT Patient presents for Retina Follow Up (5 Day F/U, IOP check, no dilation//Pt c/o discharge OS. Pt c/o watering off and on. Pt reports stable VA. Pt reports continued fb sensation intermittently OS.)   HISTORY OF PRESENT ILLNESS: Amber Riley is a 60 y.o. female who presents to the clinic today for:   HPI    Retina Follow Up    Patient presents with  Other.  In left eye.  This started 5 days ago.  Severity is mild.  Duration of 5 days.  Since onset it is stable. Additional comments: 5 Day F/U, IOP check, no dilation  Pt c/o discharge OS. Pt c/o watering off and on. Pt reports stable VA. Pt reports continued fb sensation intermittently OS.       Last edited by Rockie Neighbours, [place] on 07/22/2020  9:36 AM. (History)      Referring physician: Lennie Odor, [address] [place],  [place] [id number]  HISTORICAL INFORMATION:   Selected notes from the MEDICAL RECORD NUMBER       CURRENT MEDICATIONS: Current Outpatient Medications (Ophthalmic Drugs)  Medication Sig  . brimonidine (ALPHAGAN) 0.15 % ophthalmic solution Place 1 drop into the left eye 3 (three) times daily.  . hydroxypropyl methylcellulose / hypromellose (ISOPTO TEARS / GONIOVISC) 2.5 % ophthalmic solution Place 1 drop into both eyes as needed for dry eyes. ARTIFICIAL TEARS.  Marland Kitchen ketorolac (ACULAR) 0.5 % ophthalmic solution Place 1 drop into the left eye 2 times daily at 12 noon and 4 pm.  . timolol (TIMOPTIC) 0.5 % ophthalmic solution Place 1 drop into the left eye 2 (two) times daily.   No current facility-administered medications for this visit. (Ophthalmic Drugs)   Current Outpatient Medications (Other)  Medication Sig  . atorvastatin (LIPITOR) 10 MG tablet Take 10 mg every evening by mouth.  . cyclobenzaprine (FLEXERIL) 10 MG tablet Take 10 mg by mouth at bedtime.  . diclofenac (VOLTAREN) 50 MG EC tablet Take 50 mg by mouth 2 (two) times daily with a meal.  .  estradiol (ESTRACE) 1 MG tablet Take 1 mg by mouth daily.  . fluticasone (FLONASE) 50 MCG/ACT nasal spray Place 2 sprays into both nostrils daily.   . methylphenidate (RITALIN) 10 MG tablet 1 in AM po, prn after lunchtime. (Patient not taking: Reported on 02/23/2019)  . naproxen sodium (ANAPROX) 220 MG tablet Take 220 mg by mouth daily as needed (for pain). ALEVE.  Marland Kitchen omeprazole (PRILOSEC) 20 MG capsule Take 1 capsule (20 mg total) by mouth daily. (Patient taking differently: Take 20 mg by mouth daily as needed (heartburn). )  . Solriamfetol HCl (SUNOSI) 150 MG TABS Take one tablet in am.  [place] [id number] LOT [id number] exp 03/2020. (one pack of 7 tablets)  . SUNOSI 150 MG TABS Take 1 tablet by mouth daily.  Marland Kitchen venlafaxine XR (EFFEXOR-XR) 75 MG 24 hr capsule TAKE 1 CAPSULE BY MOUTH ONCE DAILY WITH FOOD FOR 30 DAYS   No current facility-administered medications for this visit. (Other)      REVIEW OF SYSTEMS:    ALLERGIES No Known Allergies  PAST MEDICAL HISTORY Past Medical History:  Diagnosis Date  . Anemia    hx of-was on iron pills yrs ago  . 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 Chronic back pain    DDD/scoliosis  . Constipation    takes OTC stool softener,laxatives,or enema  . Corneal edema of left eye 04/04/2020  . Depression  not on any meds  . GERD (gastroesophageal reflux disease)    occasionally but no meds  . History of migraine    last one a week ago  . History of panic attacks    doesn't take meds  . Hyperlipidemia    was on meds but has been off for yrs-told much improved  . Migraine   . Muscle spasm    takes Flexeril daily as needed  . 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 PONV (postoperative nausea and vomiting)   . Seasonal allergies    takes Claritin daily  . UTI (lower urinary tract infection)    treated in May with antibiotic  . Weakness    numbness and tingling down right left   Past Surgical History:  Procedure Laterality  Date  . ABDOMINAL HYSTERECTOMY    . BILATERAL FOOT SURGERY    . COLONOSCOPY    . EYE SURGERY Left   . knot removed from left side of neck    . MYOMECTOMY     to remove fibroids    FAMILY HISTORY Family History  Problem Relation Age of Onset  . Breast cancer Mother 53    SOCIAL HISTORY Social History   Tobacco Use  . Smoking status: Never Smoker  . Smokeless tobacco: Never Used  Substance Use Topics  . Alcohol use: Yes    Alcohol/week: 0.0 standard drinks    Comment: occasionally  . Drug use: No         OPHTHALMIC EXAM:  Base Eye Exam    Visual Acuity (ETDRS)      Right Left   Dist [place] 20/25 +2 20/30 +2   Dist ph [place]  20/25 +1       Tonometry (Tonopen, 9:37 AM)      Right Left   Pressure 13 13       Pupils      Dark Light Shape React APD   Right 4 3 Round Slow None   Left 3.5 3.5 Irregular Minimal None       Visual Fields (Counting fingers)      Left Right    Full Full       Extraocular Movement      Right Left    Full Full       Neuro/Psych    Oriented x3: Yes   Mood/Affect: Normal       Dilation   Defer per GAR        Slit Lamp and Fundus Exam    External Exam      Right Left   External Normal Normal       Slit Lamp Exam      Right Left   Lids/Lashes  Normal   Conjunctiva/Sclera  White and quiet   Cornea  Clear   Anterior Chamber  Deep, quiet   Iris  small peaked  superotemporal, no vitreous strands noted   Lens  Slightly decentered, inferiorly,  posterior chamber intraocular lens, undilated   Anterior Vitreous  pigment          IMAGING AND PROCEDURES  Imaging and Procedures for 07/22/20           ASSESSMENT/PLAN:  Dislocated intraocular lens, initial encounter Status post vitrectomy, removal of dislocated 1 piece IOL and insertion of three-piece IOL in the sulcus.  04-03-2220  Subsequent slightly decentration of sulcus placed lens inferior temporally yet with good acuity patient is still having some  glare  today  Particularly at night  Secondary glaucoma of left eye Secondary glaucoma has improved very nicely on topical to drop therapy, Alphagan and Timoptic, patient instructed to continue the use of the same medications as prescribed for this time  Cystoid macular edema, left eye Patient now on topical ketorolac twice daily left eye, with resolution of CME from the January 2022 occurrence      ICD-10-CM   1. Dislocated intraocular lens, initial encounter  T85.22XA   2. Secondary glaucoma of left eye, mild stage  H40.52X1   3. Cystoid macular edema, left eye  H35.352     1.  OS, with improved intraocular pressure and resolved corneal edema, has improved associated with improved symptoms of less fogginess to the vision in the left eye.  Coincident improvement in acuity to 20/30 pinholing to 20/25 left eye.  2.  Patient still has complaints of nighttime glare with driving likely from the slight small edge of the intraocular lens visible through the undilated pupil.  This is from a slight decentration and slight inferior movement of the sulcus centered CT [place] lens  3.  I had a long discussion with the patient and of asked her to continue on topical glaucoma medications as this has controlled the intraocular pressure.  The goal will be to in the coming weeks with a remeasurement of the pressure to consider cessation of one of the medications, likely Alphagan.  This we will determine if the eye can tolerate a lower intraocular pressure and maintain it off of 2 medications and only on 1.  4.  Have also asked the patient continue on topical NSAID because of the possibility that low-grade inflammation might be contributing to the secondary elevation of intraocular pressure.  This would also prevent recurrence of cystoid macular edema left eye.  5.  Patient has ongoing bladder complaints.  I explained the patient that surgical removal or repositioning of this intraocular lens would be accompanied by  the risk of pre of the previous surgeries which is dislocation of intraocular lens requiring suture fixation and/or scleral tunnel fixation and/or continuation of her current difficulties including inflammation including glare and including secondary glaucoma.  My professional advice for the left eye currently is to wait as the intraocular pressure now is better.  I would wait and see if there is an improvement over time of her symptoms.  If in fact her symptoms become more tolerable over time, the enemy of a good result is trying to make it better.  On the other hand if her current symptoms are continuing to trouble her I would suggest we have her evaluated either at [facility] at [facility] for another opinion and consideration of intraocular lens repositioning or exchange  Ophthalmic Meds Ordered this visit:  No orders of the defined types were placed in this encounter.      No follow-ups on file.  There are no Patient Instructions on file for this visit.   Explained the diagnoses, plan, and follow up with the patient and they expressed understanding.  Patient expressed understanding of the importance of proper follow up care.   Clent Demark Rankin M.D. Diseases & Surgery of the Retina and Vitreous Retina & Diabetic [place] 07/22/20     Abbreviations: M myopia (nearsighted); A astigmatism; H hyperopia (farsighted); P presbyopia; Mrx spectacle prescription;  CTL contact lenses; OD right eye; OS left eye; OU both eyes  XT exotropia; ET esotropia; PEK punctate epithelial keratitis;  PEE punctate epithelial erosions; DES dry eye syndrome; MGD meibomian gland dysfunction; ATs artificial tears; PFAT's preservative free artificial tears; [place] nuclear sclerotic cataract; PSC posterior subcapsular cataract; ERM epi-retinal membrane; PVD posterior vitreous detachment; RD retinal detachment; DM diabetes mellitus; DR diabetic retinopathy; NPDR non-proliferative diabetic retinopathy; PDR  proliferative diabetic retinopathy; CSME clinically significant macular edema; DME diabetic macular edema; dbh dot blot hemorrhages; CWS cotton wool spot; POAG primary open angle glaucoma; C/D cup-to-disc ratio; HVF humphrey visual field; GVF goldmann visual field; OCT optical coherence tomography; IOP intraocular pressure; BRVO Branch retinal vein occlusion; CRVO central retinal vein occlusion; CRAO central retinal artery occlusion; BRAO branch retinal artery occlusion; RT retinal tear; SB scleral buckle; PPV pars plana vitrectomy; VH Vitreous hemorrhage; PRP panretinal laser photocoagulation; IVK intravitreal kenalog; VMT vitreomacular traction; MH Macular hole;  NVD neovascularization of the disc; NVE neovascularization elsewhere; AREDS age related eye disease study; ARMD age related macular degeneration; POAG primary open angle glaucoma; EBMD epithelial/anterior basement membrane dystrophy; ACIOL anterior chamber intraocular lens; IOL intraocular lens; PCIOL posterior chamber intraocular lens; Phaco/IOL phacoemulsification with intraocular lens placement; [place] photorefractive keratectomy; LASIK laser assisted in situ keratomileusis; HTN hypertension; DM diabetes mellitus; COPD chronic obstructive pulmonary disease

## 2020-07-22 NOTE — Assessment & Plan Note (Addendum)
Status post vitrectomy, removal of dislocated 1 piece IOL and insertion of three-piece IOL in the sulcus.  04-03-2220  Subsequent slightly decentration of sulcus placed lens inferior temporally yet with good acuity patient is still having some glare today  Particularly at night

## 2020-07-22 NOTE — Assessment & Plan Note (Signed)
Patient now on topical ketorolac twice daily left eye, with resolution of CME from the January 2022 occurrence

## 2020-07-22 NOTE — Assessment & Plan Note (Signed)
Secondary glaucoma has improved very nicely on topical to drop therapy, Alphagan and Timoptic, patient instructed to continue the use of the same medications as prescribed for this time

## 2020-08-05 ENCOUNTER — Ambulatory Visit (INDEPENDENT_AMBULATORY_CARE_PROVIDER_SITE_OTHER): Payer: BC Managed Care – PPO | Admitting: Ophthalmology

## 2020-08-05 ENCOUNTER — Other Ambulatory Visit: Payer: Self-pay

## 2020-08-05 ENCOUNTER — Encounter (INDEPENDENT_AMBULATORY_CARE_PROVIDER_SITE_OTHER): Payer: Self-pay | Admitting: Ophthalmology

## 2020-08-05 DIAGNOSIS — T8522XA Displacement of intraocular lens, initial encounter: Secondary | ICD-10-CM | POA: Diagnosis not present

## 2020-08-05 DIAGNOSIS — H4052X2 Glaucoma secondary to other eye disorders, left eye, moderate stage: Secondary | ICD-10-CM | POA: Diagnosis not present

## 2020-08-05 DIAGNOSIS — T1592XA Foreign body on external eye, part unspecified, left eye, initial encounter: Secondary | ICD-10-CM

## 2020-08-05 DIAGNOSIS — H4032X4 Glaucoma secondary to eye trauma, left eye, indeterminate stage: Secondary | ICD-10-CM

## 2020-08-05 DIAGNOSIS — H35352 Cystoid macular degeneration, left eye: Secondary | ICD-10-CM

## 2020-08-05 HISTORY — DX: Glaucoma secondary to eye trauma, left eye, indeterminate stage: H40.32X4

## 2020-08-05 NOTE — Progress Notes (Signed)
08/05/2020     CHIEF COMPLAINT Patient presents for Retina Follow Up (2 Week f\u OS. OCT/Pt states OS has slightly improved. Pt c/o foreign body sensation in OS. Pt states there is a glare in OS vision, especially at night.)   HISTORY OF PRESENT ILLNESS: Amber Riley is a 60 y.o. female who presents to the clinic today for:   HPI    Retina Follow Up    Diagnosis: Dislocated IOL.  In left eye.  Severity is moderate.  Duration of 2 weeks.  Since onset it is stable.  I, the attending physician,  performed the HPI with the patient and updated documentation appropriately. Additional comments: 2 Week f\u OS. OCT Pt states OS has slightly improved. Pt c/o foreign body sensation in OS. Pt states there is a glare in OS vision, especially at night.       Last edited by Tilda Franco on 08/05/2020  8:52 AM. (History)      Referring physician: Lennie Odor, [address] [place],  [place] [address]  HISTORICAL INFORMATION:   Selected notes from the MEDICAL RECORD NUMBER       CURRENT MEDICATIONS: Current Outpatient Medications (Ophthalmic Drugs)  Medication Sig  . brimonidine (ALPHAGAN) 0.15 % ophthalmic solution Place 1 drop into the left eye 3 (three) times daily.  . hydroxypropyl methylcellulose / hypromellose (ISOPTO TEARS / GONIOVISC) 2.5 % ophthalmic solution Place 1 drop into both eyes as needed for dry eyes. ARTIFICIAL TEARS.  Marland Kitchen ketorolac (ACULAR) 0.5 % ophthalmic solution Place 1 drop into the left eye 2 times daily at 12 noon and 4 pm.  . timolol (TIMOPTIC) 0.5 % ophthalmic solution Place 1 drop into the left eye 2 (two) times daily.   No current facility-administered medications for this visit. (Ophthalmic Drugs)   Current Outpatient Medications (Other)  Medication Sig  . atorvastatin (LIPITOR) 10 MG tablet Take 10 mg every evening by mouth.  . cyclobenzaprine (FLEXERIL) 10 MG tablet Take 10 mg by mouth at bedtime.  . diclofenac (VOLTAREN) 50 MG EC  tablet Take 50 mg by mouth 2 (two) times daily with a meal.  . estradiol (ESTRACE) 1 MG tablet Take 1 mg by mouth daily.  . fluticasone (FLONASE) 50 MCG/ACT nasal spray Place 2 sprays into both nostrils daily.   . methylphenidate (RITALIN) 10 MG tablet 1 in AM po, prn after lunchtime. (Patient not taking: Reported on 02/23/2019)  . naproxen sodium (ANAPROX) 220 MG tablet Take 220 mg by mouth daily as needed (for pain). ALEVE.  Marland Kitchen omeprazole (PRILOSEC) 20 MG capsule Take 1 capsule (20 mg total) by mouth daily. (Patient taking differently: Take 20 mg by mouth daily as needed (heartburn). )  . Solriamfetol HCl (SUNOSI) 150 MG TABS Take one tablet in am.  [place] [id number] LOT [id number] exp 03/2020. (one pack of 7 tablets)  . SUNOSI 150 MG TABS Take 1 tablet by mouth daily.  Marland Kitchen venlafaxine XR (EFFEXOR-XR) 75 MG 24 hr capsule TAKE 1 CAPSULE BY MOUTH ONCE DAILY WITH FOOD FOR 30 DAYS   No current facility-administered medications for this visit. (Other)      REVIEW OF SYSTEMS:    ALLERGIES No Known Allergies  PAST MEDICAL HISTORY Past Medical History:  Diagnosis Date  . Anemia    hx of-was on iron pills yrs ago  . 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 Chronic back pain    DDD/scoliosis  . Constipation    takes OTC stool softener,laxatives,or enema  .  Corneal edema of left eye 04/04/2020  . Depression    not on any meds  . GERD (gastroesophageal reflux disease)    occasionally but no meds  . History of migraine    last one a week ago  . History of panic attacks    doesn't take meds  . Hyperlipidemia    was on meds but has been off for yrs-told much improved  . Migraine   . Muscle spasm    takes Flexeril daily as needed  . 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 PONV (postoperative nausea and vomiting)   . Seasonal allergies    takes Claritin daily  . UTI (lower urinary tract infection)    treated in May with antibiotic  . Weakness    numbness and tingling down  right left   Past Surgical History:  Procedure Laterality Date  . ABDOMINAL HYSTERECTOMY    . BILATERAL FOOT SURGERY    . COLONOSCOPY    . EYE SURGERY Left   . knot removed from left side of neck    . MYOMECTOMY     to remove fibroids    FAMILY HISTORY Family History  Problem Relation Age of Onset  . Breast cancer Mother 40    SOCIAL HISTORY Social History   Tobacco Use  . Smoking status: Never Smoker  . Smokeless tobacco: Never Used  Substance Use Topics  . Alcohol use: Yes    Alcohol/week: 0.0 standard drinks    Comment: occasionally  . Drug use: No         OPHTHALMIC EXAM:  Base Eye Exam    Visual Acuity (Snellen - Linear)      Right Left   Dist  20/20 20/25 -1       Tonometry (Tonopen, 8:55 AM)      Right Left   Pressure 15 14       Neuro/Psych    Oriented x3: Yes   Mood/Affect: Normal        Slit Lamp and Fundus Exam    External Exam      Right Left   External Normal Normal       Slit Lamp Exam      Right Left   Lids/Lashes  Normal   Conjunctiva/Sclera  White and quiet   Cornea  Clear, no edema   Anterior Chamber  Deep, quiet   Iris  small peaked  superotemporal, no vitreous strands noted   Lens  Slightly decentered, inferiorly,  posterior chamber intraocular lens, undilated   Anterior Vitreous  pigment       Fundus Exam      Right Left   Posterior Vitreous  Clear, no cells          IMAGING AND PROCEDURES  Imaging and Procedures for 08/05/20  OCT, Retina - OU - Both Eyes       Right Eye Quality was good. Scan locations included subfoveal. Central Foveal Thickness: 300. Progression has been stable. Findings include normal foveal contour.   Left Eye Scan locations included subfoveal. Central Foveal Thickness: 318.   Notes OS, resolved CME as compared to every 2022                ASSESSMENT/PLAN:  Cystoid macular edema, left eye Resolved condition, and maintained resolution on ketorolac twice daily left  eye  Foreign body of left eye Sensation of feeling of something moving on the surface of the eye, does  not feel like a scratchy feeling,  I explained the patient have no explanation for this type of feeling with movement around the surface of the eye we will asked patient to return to see her cataract surgeon  Secondary glaucoma of left eye OS with a history of orbital floor fracture, but does suggest periocular trauma and possibly injury to the globe in the past, very likely triggering angle recession glaucoma and thus the reason we had difficulty controlling the intraocular pressure during her care.  OS, I will instruct the patient to continue on topical antiglaucoma medications.  Angle recession glaucoma, left, indeterminate stage OS, this condition likely accounts for the development of secondary elevated intraocular pressure during the patient's long postoperative period, using topical steroids and even off of steroids.  Patient will need to be on topical medical therapy to prevent elevation of intraocular pressure long-term left eye.  I also explained the patient the anatomy and disclosed with pictures how blunt trauma causing a orbital floor fracture can cause occult, unseen, injuries internally in the eye which are not detectable but become manifest once surgical intervention considered to be routine is undertaken  Zonular dehiscence and/or associated with angle recession is very likely in the concerns that she has undergone in 2007 with orbital floor fracture ipsilateral      ICD-10-CM   1. Dislocated intraocular lens, initial encounter  T85.22XA OCT, Retina - OU - Both Eyes  2. Cystoid macular edema, left eye  H35.352   3. Foreign body of left eye, initial encounter  T15.92XA   4. Secondary glaucoma of left eye, moderate stage  H40.52X2   5. Angle recession glaucoma, left, indeterminate stage  H40.32X4     1.  2.  3.  Ophthalmic Meds Ordered this visit:  No orders of the  defined types were placed in this encounter.      Return in about 3 months (around 11/04/2020) for dilate, OCT, OS.  Patient Instructions  I have instructed the patient to stay on ketorolac twice daily left eye to prevent recurrence of CME.  I will explained the patient we may 1 day discontinue this as a trial to see if the eye can tolerate without recurrence of CME  Of also asked the patient to continue on topical medications brimonidine (purple top) and timolol (yellow top) as scheduled  I also suggest patient follow-up with Dr. Gershon Crane for reevaluation and long-term maintenance therapy of glaucoma possibly angle recession induced from trauma to the left globe region 2007 with orbital fracture    Explained the diagnoses, plan, and follow up with the patient and they expressed understanding.  Patient expressed understanding of the importance of proper follow up care.   Clent Demark Zabrina Brotherton M.D. Diseases & Surgery of the Retina and Vitreous [facility] [place] 08/05/20     Abbreviations: M myopia (nearsighted); A astigmatism; H hyperopia (farsighted); P presbyopia; Mrx spectacle prescription;  CTL contact lenses; OD right eye; OS left eye; OU both eyes  XT exotropia; ET esotropia; PEK punctate epithelial keratitis; PEE punctate epithelial erosions; DES dry eye syndrome; MGD meibomian gland dysfunction; ATs artificial tears; PFAT's preservative free artificial tears; [place] nuclear sclerotic cataract; PSC posterior subcapsular cataract; ERM epi-retinal membrane; PVD posterior vitreous detachment; RD retinal detachment; DM diabetes mellitus; DR diabetic retinopathy; NPDR non-proliferative diabetic retinopathy; PDR proliferative diabetic retinopathy; CSME clinically significant macular edema; DME diabetic macular edema; dbh dot blot hemorrhages; CWS cotton wool spot; POAG primary open angle glaucoma; C/D cup-to-disc ratio; HVF  humphrey visual field; GVF goldmann visual field; OCT optical  coherence tomography; IOP intraocular pressure; BRVO Branch retinal vein occlusion; CRVO central retinal vein occlusion; CRAO central retinal artery occlusion; BRAO branch retinal artery occlusion; RT retinal tear; SB scleral buckle; PPV pars plana vitrectomy; VH Vitreous hemorrhage; PRP panretinal laser photocoagulation; IVK intravitreal kenalog; VMT vitreomacular traction; MH Macular hole;  NVD neovascularization of the disc; NVE neovascularization elsewhere; AREDS age related eye disease study; ARMD age related macular degeneration; POAG primary open angle glaucoma; EBMD epithelial/anterior basement membrane dystrophy; ACIOL anterior chamber intraocular lens; IOL intraocular lens; PCIOL posterior chamber intraocular lens; Phaco/IOL phacoemulsification with intraocular lens placement; [place] photorefractive keratectomy; LASIK laser assisted in situ keratomileusis; HTN hypertension; DM diabetes mellitus; COPD chronic obstructive pulmonary disease

## 2020-08-05 NOTE — Assessment & Plan Note (Addendum)
OS with a history of orbital floor fracture, but does suggest periocular trauma and possibly injury to the globe in the past, very likely triggering angle recession glaucoma and thus the reason we had difficulty controlling the intraocular pressure during her care.  OS, I will instruct the patient to continue on topical antiglaucoma medications.

## 2020-08-05 NOTE — Assessment & Plan Note (Addendum)
Resolved condition, and maintained resolution on ketorolac twice daily left eye

## 2020-08-05 NOTE — Assessment & Plan Note (Addendum)
OS, this condition likely accounts for the development of secondary elevated intraocular pressure during the patient's long postoperative period, using topical steroids and even off of steroids.  Patient will need to be on topical medical therapy to prevent elevation of intraocular pressure long-term left eye.  I also explained the patient the anatomy and disclosed with pictures how blunt trauma causing a orbital floor fracture can cause occult, unseen, injuries internally in the eye which are not detectable but become manifest once surgical intervention considered to be routine is undertaken  Zonular dehiscence and/or associated with angle recession is very likely in the concerns that she has undergone in 2007 with orbital floor fracture ipsilateral

## 2020-08-05 NOTE — Assessment & Plan Note (Signed)
Sensation of feeling of something moving on the surface of the eye, does not feel like a scratchy feeling,  I explained the patient have no explanation for this type of feeling with movement around the surface of the eye we will asked patient to return to see her cataract surgeon

## 2020-08-05 NOTE — Patient Instructions (Addendum)
I have instructed the patient to stay on ketorolac twice daily left eye to prevent recurrence of CME.  I will explained the patient we may 1 day discontinue this as a trial to see if the eye can tolerate without recurrence of CME  Of also asked the patient to continue on topical medications brimonidine (purple top) and timolol (yellow top) as scheduled  I also suggest patient follow-up with Dr. Gershon Crane for reevaluation and long-term maintenance therapy of glaucoma possibly angle recession induced from trauma to the left globe region 2007 with orbital fracture

## 2020-08-08 ENCOUNTER — Other Ambulatory Visit (INDEPENDENT_AMBULATORY_CARE_PROVIDER_SITE_OTHER): Payer: Self-pay | Admitting: Ophthalmology

## 2020-09-24 ENCOUNTER — Other Ambulatory Visit (INDEPENDENT_AMBULATORY_CARE_PROVIDER_SITE_OTHER): Payer: Self-pay | Admitting: Ophthalmology

## 2020-10-17 ENCOUNTER — Other Ambulatory Visit (INDEPENDENT_AMBULATORY_CARE_PROVIDER_SITE_OTHER): Payer: Self-pay | Admitting: Ophthalmology

## 2020-11-03 ENCOUNTER — Other Ambulatory Visit (INDEPENDENT_AMBULATORY_CARE_PROVIDER_SITE_OTHER): Payer: Self-pay | Admitting: Ophthalmology

## 2020-11-07 ENCOUNTER — Encounter (INDEPENDENT_AMBULATORY_CARE_PROVIDER_SITE_OTHER): Payer: BC Managed Care – PPO | Admitting: Ophthalmology

## 2020-11-20 ENCOUNTER — Encounter (INDEPENDENT_AMBULATORY_CARE_PROVIDER_SITE_OTHER): Payer: BC Managed Care – PPO | Admitting: Ophthalmology

## 2020-11-25 ENCOUNTER — Other Ambulatory Visit (INDEPENDENT_AMBULATORY_CARE_PROVIDER_SITE_OTHER): Payer: Self-pay | Admitting: Ophthalmology

## 2020-11-27 ENCOUNTER — Encounter (INDEPENDENT_AMBULATORY_CARE_PROVIDER_SITE_OTHER): Payer: BC Managed Care – PPO | Admitting: Ophthalmology

## 2020-12-10 ENCOUNTER — Other Ambulatory Visit (INDEPENDENT_AMBULATORY_CARE_PROVIDER_SITE_OTHER): Payer: Self-pay | Admitting: Ophthalmology

## 2021-01-24 ENCOUNTER — Other Ambulatory Visit (INDEPENDENT_AMBULATORY_CARE_PROVIDER_SITE_OTHER): Payer: Self-pay | Admitting: Ophthalmology

## 2021-02-11 ENCOUNTER — Other Ambulatory Visit (INDEPENDENT_AMBULATORY_CARE_PROVIDER_SITE_OTHER): Payer: Self-pay | Admitting: Ophthalmology

## 2021-04-12 ENCOUNTER — Other Ambulatory Visit (INDEPENDENT_AMBULATORY_CARE_PROVIDER_SITE_OTHER): Payer: Self-pay | Admitting: Ophthalmology

## 2021-04-17 ENCOUNTER — Other Ambulatory Visit: Payer: Self-pay | Admitting: Physician Assistant

## 2021-04-17 DIAGNOSIS — Z1231 Encounter for screening mammogram for malignant neoplasm of breast: Secondary | ICD-10-CM

## 2021-05-08 ENCOUNTER — Other Ambulatory Visit (INDEPENDENT_AMBULATORY_CARE_PROVIDER_SITE_OTHER): Payer: Self-pay | Admitting: Ophthalmology

## 2021-05-20 ENCOUNTER — Ambulatory Visit
Admission: RE | Admit: 2021-05-20 | Discharge: 2021-05-20 | Disposition: A | Discharge status: Home / Self Care | Payer: BC Managed Care – PPO | Source: Ambulatory Visit | Attending: Physician Assistant | Admitting: Physician Assistant

## 2021-05-20 ENCOUNTER — Other Ambulatory Visit: Payer: Self-pay

## 2021-05-20 DIAGNOSIS — Z1231 Encounter for screening mammogram for malignant neoplasm of breast: Secondary | ICD-10-CM

## 2022-01-16 ENCOUNTER — Other Ambulatory Visit (INDEPENDENT_AMBULATORY_CARE_PROVIDER_SITE_OTHER): Payer: Self-pay | Admitting: Ophthalmology

## 2022-05-01 ENCOUNTER — Other Ambulatory Visit: Payer: Self-pay | Admitting: Physician Assistant

## 2022-05-01 DIAGNOSIS — Z13820 Encounter for screening for osteoporosis: Secondary | ICD-10-CM

## 2022-05-05 ENCOUNTER — Other Ambulatory Visit: Payer: Self-pay | Admitting: Physician Assistant

## 2022-05-05 DIAGNOSIS — Z1231 Encounter for screening mammogram for malignant neoplasm of breast: Secondary | ICD-10-CM

## 2022-06-09 ENCOUNTER — Ambulatory Visit
Admission: RE | Admit: 2022-06-09 | Discharge: 2022-06-09 | Disposition: A | Discharge status: Home / Self Care | Payer: BC Managed Care – PPO | Source: Ambulatory Visit | Attending: Physician Assistant | Admitting: Physician Assistant

## 2022-06-09 DIAGNOSIS — Z13820 Encounter for screening for osteoporosis: Secondary | ICD-10-CM

## 2022-06-09 DIAGNOSIS — Z1231 Encounter for screening mammogram for malignant neoplasm of breast: Secondary | ICD-10-CM

## 2022-06-23 ENCOUNTER — Ambulatory Visit: Payer: BC Managed Care – PPO

## 2022-08-14 ENCOUNTER — Encounter (INDEPENDENT_AMBULATORY_CARE_PROVIDER_SITE_OTHER): Payer: Self-pay | Admitting: Ophthalmology

## 2022-08-14 ENCOUNTER — Ambulatory Visit (INDEPENDENT_AMBULATORY_CARE_PROVIDER_SITE_OTHER): Payer: BC Managed Care – PPO | Admitting: Ophthalmology

## 2022-08-14 DIAGNOSIS — T8522XD Displacement of intraocular lens, subsequent encounter: Secondary | ICD-10-CM

## 2022-08-14 DIAGNOSIS — Z961 Presence of intraocular lens: Secondary | ICD-10-CM

## 2022-08-14 DIAGNOSIS — H4052X0 Glaucoma secondary to other eye disorders, left eye, stage unspecified: Secondary | ICD-10-CM | POA: Diagnosis not present

## 2022-08-14 DIAGNOSIS — H43392 Other vitreous opacities, left eye: Secondary | ICD-10-CM | POA: Diagnosis not present

## 2022-08-14 DIAGNOSIS — S0232XS Fracture of orbital floor, left side, sequela: Secondary | ICD-10-CM

## 2022-08-14 NOTE — Progress Notes (Signed)
[facility] - Clinic Note  08/14/2022   CHIEF COMPLAINT Patient presents for Retina Evaluation  HISTORY OF PRESENT ILLNESS: Amber Riley is a 62 y.o. female who presents to the clinic today for:  HPI     Retina Evaluation   In left eye.  This started 1 day ago.  Duration of 1 day.  Associated Symptoms Floaters and Pain.  I, the attending physician,  performed the HPI with the patient and updated documentation appropriately.        Comments   Retina eval pt is under the care of Amber Riley she woke up seeing a floater in the left eye within a few minutes she started seeing several she denies any flashes of light she denies any vision changes she has had a aching feeling in her eye pt has glaucoma using dorz bid ou       Last edited by Amber Chris, MD on 08/14/2022  4:11 PM.    Dr. Luciana Riley pt here for new floaters starting this morning, pt states she woke up this morning and saw a large floater that looked like a bug, she states the next thing she new she was seeing "thousands" of floaters, she tried blinking to clear them up, but they didn't go away, she feels like her vision is back to baseline now, pt is on Cosopt for glaucoma, pt is s/p PPV and insertion of sulcus IOL OS with Dr. Luciana Riley, pt also sees Dr. Nile Riley  Referring physician: Milus Height, PA [address] [place],  [place] [address]  HISTORICAL INFORMATION:  Selected notes from the MEDICAL RECORD NUMBER Call coverage -- [facility] pt LEE: 02.04.22 -- BCVA 20/20 OD, 20/25 OS Ocular Hx- 12.1.21 PPV w/ IOL exchange to sulcus IOL OS PMH-   CURRENT MEDICATIONS: Current Outpatient Medications (Ophthalmic Drugs)  Medication Sig   brimonidine (ALPHAGAN) 0.15 % ophthalmic solution INSTILL 1 DROP INTO LEFT EYE THREE TIMES DAILY   hydroxypropyl methylcellulose / hypromellose (ISOPTO TEARS / GONIOVISC) 2.5 % ophthalmic solution Place 1 drop into both eyes as needed for dry eyes. ARTIFICIAL TEARS.    ketorolac (ACULAR) 0.5 % ophthalmic solution INSTILL 1 DROP INTO LEFT EYE TWICE DAILY AT  12  NOON  AND  4PM   timolol (TIMOPTIC) 0.5 % ophthalmic solution INSTILL 1 DROP INTO LEFT EYE TWICE DAILY   No current facility-administered medications for this visit. (Ophthalmic Drugs)   Current Outpatient Medications (Other)  Medication Sig   atorvastatin (LIPITOR) 10 MG tablet Take 10 mg every evening by mouth.   cyclobenzaprine (FLEXERIL) 10 MG tablet Take 10 mg by mouth at bedtime.   diclofenac (VOLTAREN) 50 MG EC tablet Take 50 mg by mouth 2 (two) times daily with a meal.   estradiol (ESTRACE) 1 MG tablet Take 1 mg by mouth daily.   fluticasone (FLONASE) 50 MCG/ACT nasal spray Place 2 sprays into both nostrils daily.    methylphenidate (RITALIN) 10 MG tablet 1 in AM po, prn after lunchtime. (Patient not taking: Reported on 02/23/2019)   naproxen sodium (ANAPROX) 220 MG tablet Take 220 mg by mouth daily as needed (for pain). ALEVE.   omeprazole (PRILOSEC) 20 MG capsule Take 1 capsule (20 mg total) by mouth daily. (Patient taking differently: Take 20 mg by mouth daily as needed (heartburn). )   Solriamfetol HCl (SUNOSI) 150 MG TABS Take one tablet in am.  NDC [address] LOT [id number] exp 03/2020. (one pack of 7 tablets)   SUNOSI 150 MG  TABS Take 1 tablet by mouth daily.   venlafaxine XR (EFFEXOR-XR) 75 MG 24 hr capsule TAKE 1 CAPSULE BY MOUTH ONCE DAILY WITH FOOD FOR 30 DAYS   No current facility-administered medications for this visit. (Other)   REVIEW OF SYSTEMS: ROS   Positive for: Allergic/Imm Last edited by Etheleen Mayhew, COT on 08/14/2022  1:49 PM.     ALLERGIES No Known Allergies PAST MEDICAL HISTORY Past Medical History:  Diagnosis Date   Anemia    hx of-was on iron pills yrs ago   Aphakia, left 03/27/2020   Chronic back pain    DDD/scoliosis   Constipation    takes OTC stool softener,laxatives,or enema   Corneal edema of left eye 04/04/2020   Depression    not on  any meds   GERD (gastroesophageal reflux disease)    occasionally but no meds   History of migraine    last one a week ago   History of panic attacks    doesn't take meds   Hyperlipidemia    was on meds but has been off for yrs-told much improved   Migraine    Muscle spasm    takes Flexeril daily as needed   Narcolepsy    was on meds yrs ago;will take Melatonin if needed   PONV (postoperative nausea and vomiting)    Seasonal allergies    takes Claritin daily   UTI (lower urinary tract infection)    treated in May with antibiotic   Weakness    numbness and tingling down right left   Past Surgical History:  Procedure Laterality Date   ABDOMINAL HYSTERECTOMY     BILATERAL FOOT SURGERY     BREAST BIOPSY Right 06/07/2020   neg   COLONOSCOPY     EYE SURGERY Left    knot removed from left side of neck     MYOMECTOMY     to remove fibroids   FAMILY HISTORY Family History  Problem Relation Age of Onset   Breast cancer Mother 40   SOCIAL HISTORY Social History   Tobacco Use   Smoking status: Never   Smokeless tobacco: Never  Substance Use Topics   Alcohol use: Yes    Alcohol/week: 0.0 standard drinks of alcohol    Comment: occasionally   Drug use: No       OPHTHALMIC EXAM:  Base Eye Exam     Visual Acuity (Snellen - Linear)       Right Left   Dist cc 20/20 -1 20/25 -2   Dist ph cc  NI         Tonometry (Tonopen, 1:55 PM)       Right Left   Pressure 14 14         Pupils       Pupils   Right PERRL   Left PERRL         Visual Fields       Left Right    Full Full         Extraocular Movement       Right Left    Full, Ortho Full, Ortho         Neuro/Psych     Oriented x3: Yes   Mood/Affect: Normal         Dilation     Both eyes: 2.5% Phenylephrine @ 1:55 PM           Slit Lamp and Fundus Exam     External Exam  Right Left   External Normal Normal         Slit Lamp Exam       Right Left   Lids/Lashes  Dermatochalasis - upper lid Dermatochalasis - upper lid   Conjunctiva/Sclera Melanosis Melanosis   Cornea arcus, well healed cataract wound, trace PEE well healed cataract wound, nylon suture at 0200, fine endo pigment   Anterior Chamber deep and clear Deep, quiet   Iris Round and dilated Irregular dilation, mildly peaked pupil at 0130, mild iris atrophy ST quad   Lens Toric PC IOL with marks at 1230 and 0530 3 piece sulcus IOL, mild inferior displacement   Anterior Vitreous syneresis post vitrectomy - mild residual anterior hyaloid w/ pigment         Fundus Exam       Right Left   Posterior Vitreous  post vitrectomy, residual vitreous remnants with +pigment   Disc Pink and Sharp, Compact, +PPP Pink and Sharp, Compact, +PPP   C/D Ratio 0.3 0.4   Macula Flat, Good foveal reflex, No heme or edema Flat, Good foveal reflex, RPE mottling, No heme or edema   Vessels mild attenuation, mild tortuosity attenuated, mild tortuosity   Periphery Attached, No heme Attached, No heme, pigmented CR scarring IN periphery           IMAGING AND PROCEDURES  Imaging and Procedures for 08/14/2022  OCT, Retina - OU - Both Eyes       Right Eye Quality was good. Scan locations included subfoveal. Central Foveal Thickness: 285. Progression has no prior data. Findings include normal foveal contour, no IRF, no SRF, vitreomacular adhesion (Partial PVD).   Left Eye Scan locations included subfoveal. Central Foveal Thickness: 296. Progression has no prior data. Findings include normal foveal contour, no IRF, no SRF (Partial PVD).   Notes *Images captured and stored on drive  Diagnosis / Impression:  NFP, no IRF/SRF OU  Clinical management:  See below  Abbreviations: NFP - Normal foveal profile. CME - cystoid macular edema. PED - pigment epithelial detachment. IRF - intraretinal fluid. SRF - subretinal fluid. EZ - ellipsoid zone. ERM - epiretinal membrane. ORA - outer retinal atrophy. ORT - outer  retinal tubulation. SRHM - subretinal hyper-reflective material. IRHM - intraretinal hyper-reflective material           ASSESSMENT/PLAN:   ICD-10-CM   1. Visual floaters, left  H43.392     2. Vitreous syneresis of left eye  H43.392 OCT, Retina - OU - Both Eyes    3. Pseudophakia of both eyes  Z96.1     4. Dislocated intraocular lens, subsequent encounter  T85.22XD     5. 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 Open fracture of left orbital floor, sequela  S02.32XS      1,2. Vitreous synereis / floaters OS  - pt woke up this morning with a large floater OS -- within minutes she was seeing "thousands"  - upon presentation here, symptomatic floaters resolved  - BCVA OS stable at 20/25  - OCT shows partial PVD  - Discussed findings and prognosis  - No RT or RD on 360 exam  - Reviewed s/s of RT/RD  - Strict return precautions for any such RT/RD signs/symptoms  - f/u in 4-6 wks -- DFE/OCT  3,4. Pseudophakia OU  - s/p CE/IOL OU by Dr. Nile Riley  - OS with history of posterior dislocation  - s/p PPV w/ IOL  exchange to sulcus IOL OS ([facility], 12.01.21)  - IOL OD in good position, sulcus IOL OS with mild inferior displacement  - BCVA remains good -- 20/20 OD, 20/25 OS  - monitor  5. History of Glaucoma  - under the expert management of Dr. Nile Riley  - recently switched from Brim and Timolol to Cosopt BID OS  - IOP 14 OU  6. History of orbital floor fracture OS  - s/p repair  Ophthalmic Meds Ordered this visit:  No orders of the defined types were placed in this encounter.    Return for f/u 4-6 weeks, floaters OS, DFE, OCT.  There are no Patient Instructions on file for this visit.  Explained the diagnoses, plan, and follow up with the patient and they expressed understanding.  Patient expressed understanding of the importance of proper follow up care.   This document serves as a record of services personally  performed by Karie Chimera, MD, PhD. It was created on their behalf by Glee Arvin. Manson Passey, OA an ophthalmic technician. The creation of this record is the provider's dictation and/or activities during the visit.    Electronically signed by: Glee Arvin. Manson Passey, [place] 04.12.2024 4:32 PM  Karie Chimera, M.D., Ph.D. Diseases & Surgery of the Retina and Vitreous [facility] 08/14/2022  I have reviewed the above documentation for accuracy and completeness, and I agree with the above. Karie Chimera, M.D., Ph.D. 08/14/22 4:32 PM  Abbreviations: M myopia (nearsighted); A astigmatism; H hyperopia (farsighted); P presbyopia; Mrx spectacle prescription;  CTL contact lenses; OD right eye; OS left eye; OU both eyes  XT exotropia; ET esotropia; PEK punctate epithelial keratitis; PEE punctate epithelial erosions; DES dry eye syndrome; MGD meibomian gland dysfunction; ATs artificial tears; PFAT's preservative free artificial tears; NSC nuclear sclerotic cataract; PSC posterior subcapsular cataract; ERM epi-retinal membrane; PVD posterior vitreous detachment; RD retinal detachment; DM diabetes mellitus; DR diabetic retinopathy; NPDR non-proliferative diabetic retinopathy; PDR proliferative diabetic retinopathy; CSME clinically significant macular edema; DME diabetic macular edema; dbh dot blot hemorrhages; CWS cotton wool spot; POAG primary open angle glaucoma; C/D cup-to-disc ratio; HVF humphrey visual field; GVF goldmann visual field; OCT optical coherence tomography; IOP intraocular pressure; BRVO Branch retinal vein occlusion; CRVO central retinal vein occlusion; CRAO central retinal artery occlusion; BRAO branch retinal artery occlusion; RT retinal tear; SB scleral buckle; PPV pars plana vitrectomy; VH Vitreous hemorrhage; PRP panretinal laser photocoagulation; IVK intravitreal kenalog; VMT vitreomacular traction; MH Macular hole;  NVD neovascularization of the disc; NVE neovascularization  elsewhere; AREDS age related eye disease study; ARMD age related macular degeneration; POAG primary open angle glaucoma; EBMD epithelial/anterior basement membrane dystrophy; ACIOL anterior chamber intraocular lens; IOL intraocular lens; PCIOL posterior chamber intraocular lens; Phaco/IOL phacoemulsification with intraocular lens placement; PRK photorefractive keratectomy; LASIK laser assisted in situ keratomileusis; HTN hypertension; DM diabetes mellitus; COPD chronic obstructive pulmonary disease

## 2022-09-11 ENCOUNTER — Encounter (INDEPENDENT_AMBULATORY_CARE_PROVIDER_SITE_OTHER): Payer: BC Managed Care – PPO | Admitting: Ophthalmology

## 2022-09-14 ENCOUNTER — Encounter (INDEPENDENT_AMBULATORY_CARE_PROVIDER_SITE_OTHER): Payer: BC Managed Care – PPO | Admitting: Ophthalmology

## 2023-03-16 ENCOUNTER — Encounter: Payer: Self-pay | Admitting: Podiatry

## 2023-03-16 ENCOUNTER — Ambulatory Visit: Payer: BC Managed Care – PPO | Admitting: Podiatry

## 2023-03-16 DIAGNOSIS — L603 Nail dystrophy: Secondary | ICD-10-CM

## 2023-03-16 NOTE — Progress Notes (Signed)
Subjective:  Patient ID: Amber Riley, female    DOB: 1960-12-16,  MRN: [id number] HPI Chief Complaint  Patient presents with   Nail Problem    Hallux right - toenail thick and dark x months, pedicurist put acrylic over it and ended up knocked it off over the summer, check other nails as well   New Patient (Initial Visit)    Est pt 2019    62 y.o. female presents with the above complaint.   ROS: Denies fever chills nausea vomit muscle aches pains calf pain back pain chest pain shortness of breath.  Past Medical History:  Diagnosis Date   Anemia    hx of-was on iron pills yrs ago   Aphakia, left 03/27/2020   Chronic back pain    DDD/scoliosis   Constipation    takes OTC stool softener,laxatives,or enema   Corneal edema of left eye 04/04/2020   Depression    not on any meds   GERD (gastroesophageal reflux disease)    occasionally but no meds   History of migraine    last one a week ago   History of panic attacks    doesn't take meds   Hyperlipidemia    was on meds but has been off for yrs-told much improved   Migraine    Muscle spasm    takes Flexeril daily as needed   Narcolepsy    was on meds yrs ago;will take Melatonin if needed   PONV (postoperative nausea and vomiting)    Seasonal allergies    takes Claritin daily   UTI (lower urinary tract infection)    treated in May with antibiotic   Weakness    numbness and tingling down right left   Past Surgical History:  Procedure Laterality Date   ABDOMINAL HYSTERECTOMY     BILATERAL FOOT SURGERY     BREAST BIOPSY Right 06/07/2020   neg   COLONOSCOPY     EYE SURGERY Left    knot removed from left side of neck     MYOMECTOMY     to remove fibroids    Current Outpatient Medications:    brimonidine-timolol (COMBIGAN) 0.2-0.5 % ophthalmic solution, Apply 1 drop to eye 2 (two) times daily., Disp: , Rfl:    atorvastatin (LIPITOR) 10 MG tablet, Take 10 mg every evening by mouth., Disp: , Rfl:     cyclobenzaprine (FLEXERIL) 10 MG tablet, Take 10 mg by mouth at bedtime., Disp: , Rfl:    diclofenac (VOLTAREN) 50 MG EC tablet, Take 50 mg by mouth 2 (two) times daily with a meal., Disp: , Rfl:    estradiol (ESTRACE) 1 MG tablet, Take 1 mg by mouth daily., Disp: , Rfl:    fluticasone (FLONASE) 50 MCG/ACT nasal spray, Place 2 sprays into both nostrils daily. , Disp: , Rfl:    hydroxypropyl methylcellulose / hypromellose (ISOPTO TEARS / GONIOVISC) 2.5 % ophthalmic solution, Place 1 drop into both eyes as needed for dry eyes. ARTIFICIAL TEARS., Disp: , Rfl:    ketorolac (ACULAR) 0.5 % ophthalmic solution, INSTILL 1 DROP INTO LEFT EYE TWICE DAILY AT  12  NOON  AND  4PM, Disp: 5 mL, Rfl: 0   methylphenidate (RITALIN) 10 MG tablet, 1 in AM po, prn after lunchtime. (Patient not taking: Reported on 02/23/2019), Disp: 60 tablet, Rfl: 0   naproxen sodium (ANAPROX) 220 MG tablet, Take 220 mg by mouth daily as needed (for pain). ALEVE., Disp: , Rfl:    omeprazole (PRILOSEC) 20 MG capsule, Take  1 capsule (20 mg total) by mouth daily. (Patient taking differently: Take 20 mg by mouth daily as needed (heartburn). ), Disp: 14 capsule, Rfl: 0   Solriamfetol HCl (SUNOSI) 150 MG TABS, Take one tablet in am.  NDC [address] LOT [id number] exp 03/2020. (one pack of 7 tablets), Disp: 7 tablet, Rfl: 0   SUNOSI 150 MG TABS, Take 1 tablet by mouth daily., Disp: 30 tablet, Rfl: 2   timolol (TIMOPTIC) 0.5 % ophthalmic solution, INSTILL 1 DROP INTO LEFT EYE TWICE DAILY, Disp: 5 mL, Rfl: 6   venlafaxine XR (EFFEXOR-XR) 75 MG 24 hr capsule, TAKE 1 CAPSULE BY MOUTH ONCE DAILY WITH FOOD FOR 30 DAYS, Disp: , Rfl:   No Known Allergies Review of Systems Objective:  There were no vitals filed for this visit.  General: Well developed, nourished, in no acute distress, alert and oriented x3   Dermatological: Skin is warm, dry and supple bilateral.  Toenails 1 through 5 bilaterally are thick dark and dystrophic and painful.; remaining  integument appears unremarkable at this time. There are no open sores, no preulcerative lesions, no rash or signs of infection present.  Vascular: Dorsalis Pedis artery and Posterior Tibial artery pedal pulses are 2/4 bilateral with immedate capillary fill time. Pedal hair growth present. No varicosities and no lower extremity edema present bilateral.   Neruologic: Grossly intact via light touch bilateral. Vibratory intact via tuning fork bilateral. Protective threshold with Semmes Wienstein monofilament intact to all pedal sites bilateral. Patellar and Achilles deep tendon reflexes 2+ bilateral. No Babinski or clonus noted bilateral.   Musculoskeletal: No gross boney pedal deformities bilateral. No pain, crepitus, or limitation noted with foot and ankle range of motion bilateral. Muscular strength 5/5 in all groups tested bilateral.  Gait: Unassisted, Nonantalgic.    Radiographs:  None taken   Assessment & Plan:   Assessment: Nail dystrophy 1 through 5 bilateral  Plan: Samples of skin and nail were taken today pathologic evaluation follow-up with her in 1 month     Dora Clauss T. [place], [place]

## 2023-03-29 ENCOUNTER — Other Ambulatory Visit: Payer: Self-pay | Admitting: Podiatry

## 2023-04-05 MED ORDER — NEOMYCIN-POLYMYXIN-HC 3.5-10000-1 OT SOLN: OTIC | 3 refills | Status: DC

## 2023-04-05 NOTE — Addendum Note (Signed)
Addended by: Ernestene Kiel T on: 04/05/2023 08:17 AM   Modules accepted: Orders

## 2023-04-07 ENCOUNTER — Other Ambulatory Visit: Payer: Self-pay

## 2023-04-07 ENCOUNTER — Telehealth: Payer: Self-pay

## 2023-04-07 MED ORDER — NEOMYCIN-POLYMYXIN-HC 3.5-10000-1 OT SOLN: OTIC | 3 refills | Status: AC

## 2023-04-07 NOTE — Telephone Encounter (Signed)
Spoke to patient at length and advised. Resent cortisporin drops to the [facility] in [place]. thanks

## 2023-04-07 NOTE — Telephone Encounter (Signed)
-----   Message from Elinor Parkinson sent at 04/05/2023  8:14 AM EST ----- Please inform the patient...  She doesn't have fungus, but micro trauma to the nails. She can try a urea gel to help thin and help appearance. Also I will be sending in cortisporin drop for a bacterial infection in the nails. She can use those for the next 3 months daily and follow up with me then if need be.

## 2023-04-19 ENCOUNTER — Encounter: Payer: Self-pay | Admitting: Podiatry

## 2023-04-20 ENCOUNTER — Ambulatory Visit: Payer: BC Managed Care – PPO | Admitting: Podiatry

## 2023-05-18 ENCOUNTER — Other Ambulatory Visit: Payer: Self-pay | Admitting: Physician Assistant

## 2023-05-18 DIAGNOSIS — Z0000 Encounter for general adult medical examination without abnormal findings: Secondary | ICD-10-CM

## 2023-05-25 ENCOUNTER — Ambulatory Visit: Payer: BC Managed Care – PPO | Admitting: Podiatry

## 2023-06-15 ENCOUNTER — Ambulatory Visit
Admission: RE | Admit: 2023-06-15 | Discharge: 2023-06-15 | Disposition: A | Discharge status: Home / Self Care | Payer: 59 | Source: Ambulatory Visit | Attending: Physician Assistant | Admitting: Physician Assistant

## 2023-06-15 DIAGNOSIS — Z0000 Encounter for general adult medical examination without abnormal findings: Secondary | ICD-10-CM

## 2023-06-24 ENCOUNTER — Ambulatory Visit: Payer: Self-pay | Admitting: Podiatry

## 2023-09-02 ENCOUNTER — Other Ambulatory Visit: Payer: Self-pay | Admitting: Physician Assistant

## 2023-09-02 DIAGNOSIS — R519 Headache, unspecified: Secondary | ICD-10-CM

## 2023-09-17 ENCOUNTER — Ambulatory Visit
Admission: RE | Admit: 2023-09-17 | Discharge: 2023-09-17 | Disposition: A | Discharge status: Home / Self Care | Source: Ambulatory Visit | Attending: Physician Assistant | Admitting: Physician Assistant

## 2023-09-17 DIAGNOSIS — R519 Headache, unspecified: Secondary | ICD-10-CM

## 2023-09-21 ENCOUNTER — Encounter: Payer: Self-pay | Admitting: Cardiology

## 2023-09-21 DIAGNOSIS — M75101 Unspecified rotator cuff tear or rupture of right shoulder, not specified as traumatic: Secondary | ICD-10-CM

## 2023-09-21 DIAGNOSIS — R531 Weakness: Secondary | ICD-10-CM | POA: Insufficient documentation

## 2023-09-21 DIAGNOSIS — M533 Sacrococcygeal disorders, not elsewhere classified: Secondary | ICD-10-CM | POA: Insufficient documentation

## 2023-09-21 DIAGNOSIS — R112 Nausea with vomiting, unspecified: Secondary | ICD-10-CM | POA: Insufficient documentation

## 2023-09-21 DIAGNOSIS — J302 Other seasonal allergic rhinitis: Secondary | ICD-10-CM | POA: Insufficient documentation

## 2023-09-21 DIAGNOSIS — Z8669 Personal history of other diseases of the nervous system and sense organs: Secondary | ICD-10-CM | POA: Insufficient documentation

## 2023-09-21 DIAGNOSIS — E785 Hyperlipidemia, unspecified: Secondary | ICD-10-CM | POA: Insufficient documentation

## 2023-09-21 DIAGNOSIS — M62838 Other muscle spasm: Secondary | ICD-10-CM | POA: Insufficient documentation

## 2023-09-21 DIAGNOSIS — E236 Other disorders of pituitary gland: Secondary | ICD-10-CM

## 2023-09-21 DIAGNOSIS — D649 Anemia, unspecified: Secondary | ICD-10-CM | POA: Insufficient documentation

## 2023-09-21 DIAGNOSIS — G8929 Other chronic pain: Secondary | ICD-10-CM | POA: Insufficient documentation

## 2023-09-21 DIAGNOSIS — K219 Gastro-esophageal reflux disease without esophagitis: Secondary | ICD-10-CM | POA: Insufficient documentation

## 2023-09-21 HISTORY — DX: Unspecified rotator cuff tear or rupture of right shoulder, not specified as traumatic: M75.101

## 2023-09-21 HISTORY — DX: Other disorders of pituitary gland: E23.6

## 2023-09-22 ENCOUNTER — Encounter: Payer: Self-pay | Admitting: Cardiology

## 2023-09-22 ENCOUNTER — Ambulatory Visit: Attending: Cardiology | Admitting: Cardiology

## 2023-09-22 VITALS — BP 122/84 | HR 70 | Ht 65.0 in | Wt 184.2 lb

## 2023-09-22 DIAGNOSIS — R0789 Other chest pain: Secondary | ICD-10-CM | POA: Insufficient documentation

## 2023-09-22 DIAGNOSIS — E782 Mixed hyperlipidemia: Secondary | ICD-10-CM | POA: Diagnosis not present

## 2023-09-22 DIAGNOSIS — R002 Palpitations: Secondary | ICD-10-CM

## 2023-09-22 DIAGNOSIS — R42 Dizziness and giddiness: Secondary | ICD-10-CM | POA: Insufficient documentation

## 2023-09-22 DIAGNOSIS — R072 Precordial pain: Secondary | ICD-10-CM

## 2023-09-22 MED ORDER — METOPROLOL TARTRATE 100 MG PO TABS
ORAL_TABLET | ORAL | 0 refills | Status: AC
Start: 2023-09-22 — End: ?

## 2023-09-22 NOTE — Patient Instructions (Addendum)
 Medication Instructions:   TAKE: Metoprolol 100mg  1 tablet 2 hours prior to CT Scan   Lab Work: Your physician recommends that you return for lab work in: when fasting You need to have labs done when you are fasting.  You can come Monday through Friday 8:30 am to 12:00 pm and 1:15 to 4:30. You do not need to make an appointment as the order has already been placed. The labs you are going to have done are BMET, Lipids.    Testing/Procedures:  Your cardiac CT will be scheduled at one of the below locations:   [facility] [address] [place], [place] [address]  Please follow these instructions carefully (unless otherwise directed):    On the Night Before the Test: Be sure to Drink plenty of water. Do not consume any caffeinated/decaffeinated beverages or chocolate 12 hours prior to your test. Do not take any antihistamines 12 hours prior to your test.   On the Day of the Test: Drink plenty of water until 1 hour prior to the test. Do not eat any food 4 hours prior to the test. You may take your regular medications prior to the test.  Take metoprolol (Lopressor) two hours prior to test. HOLD Furosemide/Hydrochlorothiazide morning of the test. FEMALES- please wear underwire-free bra if available, avoid dresses & tight clothing       After the Test: Drink plenty of water. After receiving IV contrast, you may experience a mild flushed feeling. This is normal. On occasion, you may experience a mild rash up to 24 hours after the test. This is not dangerous. If this occurs, you can take Benadryl  25 mg and increase your fluid intake. If you experience trouble breathing, this can be serious. If it is severe call 911 IMMEDIATELY. If it is mild, please call our office. If you take any of these medications: Glipizide/Metformin, Avandament, Glucavance, please do not take 48 hours after completing test unless otherwise instructed.  We will call to schedule your test 2-4 weeks  out understanding that some insurance companies will need an authorization prior to the service being performed.   For non-scheduling related questions, please contact the cardiac imaging nurse navigator should you have any questions/concerns: Jinger Mount, Cardiac Imaging Nurse Navigator Chase Copping, Cardiac Imaging Nurse Navigator [facility] Direct Office Dial: [phone]   For scheduling needs, including cancellations and rescheduling, please call [place], [phone].    Follow-Up: At [facility], you and your health needs are our priority.  As part of our continuing mission to provide you with exceptional heart care, we have created designated Provider Care Teams.  These Care Teams include your primary Cardiologist (physician) and Advanced Practice Providers (APPs -  Physician Assistants and Nurse Practitioners) who all work together to provide you with the care you need, when you need it.  We recommend signing up for the patient portal called "MyChart".  Sign up information is provided on this After Visit Summary.  MyChart is used to connect with patients for Virtual Visits (Telemedicine).  Patients are able to view lab/test results, encounter notes, upcoming appointments, etc.  Non-urgent messages can be sent to your provider as well.   To learn more about what you can do with MyChart, go to [email or web address].    Your next appointment:   2 month(s)  The format for your next appointment:   In Person  Provider:   Ralene Burger, MD    Other Instructions NA

## 2023-09-22 NOTE — Progress Notes (Addendum)
 Cardiology Consultation:    Date:  09/22/2023   ID:  Amber Riley, DOB 02-08-1961, MRN [id number]  PCP:  Redmon, Noelle, PA  Cardiologist:  Ralene Burger, MD   Referring MD: Diamond Formica, [place]   No chief complaint on file.   History of Present Illness:    Amber Riley is a 63 y.o. female who is being seen today for the evaluation of chest pain at the request of Redmon, [place], [place].  Who was referred to us  because of episode of chest pain.  She described chest pain kind of strange sensation located in the right side in the middle of the chest sometimes radiating towards the jaw that happen at rest can last for couple hours straight.  There is no aggravating or relieving factor even though she sleeps with this sensation she greatest 7 in scale up to 10, denies having any sweating or shortness of breath associated with this sensation.  She also described to have some dizziness dizziness happen when she sits or walks when she walk up stairs sometimes she will get dizzy.  Denies having any palpitation associated with this sensation she recently wore a monitor which I do not have results of yet.  Does not smoke never did try some but did not like it, she does not have family history of premature coronary artery disease, does not exercise on the regular basis.  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 An informational brochure was offered.  The patient's questions were answered. The potential side effects of the various treatments were reviewed including the   Depressed mood 03/07/2018   Depression    not on any meds   Dislocated intraocular lens, initial encounter 03/27/2020   03/27/2020, during IOL exchange by Dr. 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   Qualifier: Diagnosis of   By: Linder Revere MD, Jeanette Milks      Replacing diagnoses that were inactivated after the 08/03/22 regulatory import     DYSMENORRHEA, SEVERE 06/14/2006   Qualifier: Diagnosis of   By: Linder Revere MD, Jeanette Milks         Excessive daytime sleepiness 03/07/2018   Foreign body of left eye 06/06/2020   2 suture tags exposed from repair will be removed today     FX, ORBITAL FLOOR, OPEN 02/26/2006   Annotation: history of  Qualifier: Diagnosis of   By: Linder Revere MD, Jeanette Milks      Replacing diagnoses that were inactivated after the 08/03/22 regulatory import     GERD (gastroesophageal reflux disease)    occasionally but no meds   HEADACHE 03/02/2007   Qualifier: Diagnosis of   By: Linder Revere MD, Jeanette Milks  Replacing diagnoses that were inactivated after the 08/03/22 regulatory import     History of migraine    last one a week ago   History of panic attacks    doesn't take meds   HOARSENESS 05/25/2007   Qualifier: Diagnosis of   By: Sueanne Emerald MD, Bynum Cassis         Hyperlipidemia    was on meds but has been off for yrs-told much improved   LEIOMYOMA, UTERUS 02/26/2006   Annotation: removed in 2003  Qualifier: Diagnosis of   By: Linder Revere MD, Minor Amble with sciatica 07/11/2014   Migraine    MIGRAINE HEADACHE 02/26/2006   Qualifier: Diagnosis of   By: Linder Revere MD, Jeanette Milks      Replacing diagnoses that were inactivated after the  08/03/22 regulatory import     Muscle spasm    takes Flexeril  daily as needed   Narcolepsy    was on meds yrs ago;will take Melatonin if needed   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   Annotation: Sleep study on 11/09/05  Qualifier: Diagnosis of   By: Linder Revere MD, Raoul Byes MULTINODULAR GOITER 04/14/2007   Qualifier: Diagnosis of   By: Ival Marines MD, Estela         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   Pain of both sacroiliac joints 09/21/2023   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   PONV (postoperative nausea and vomiting)    Presbyopia of both eyes 06/01/2016   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   Pseudophakia, left eye 04/23/2020   Radiculitis of right cervical region 08/09/2013   Right lumbar radiculitis 08/09/2013   Rupture of right rotator cuff 09/21/2023   Seasonal allergies    takes Claritin daily   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   Onset secondary glaucoma currently patient, and patient is not using any other prescription medications and specifically is not using a steroid or steroid related medications topically or systemically     Therefore this could be low-grade inflammatory or could be positive Schlossman syndrome or could be pigmentary glaucoma but in any case the intraocular pressure does need to be lowered we will st   Sleep disturbance 03/07/2018   STRESS INCONTINENCE 05/04/2007   Qualifier: Diagnosis of   By: Ival Marines MD, Estela         Tinnitus aurium, bilateral 09/13/2018   URI 07/20/2007   Qualifier: Diagnosis of   By: Linder Revere MD, Jeanette Milks      Replacing diagnoses that were inactivated after the 08/03/22 regulatory import     UTI (lower urinary tract infection)    treated in May with antibiotic   Weakness    numbness and tingling down right left    Past Surgical History:  Procedure Laterality Date   ABDOMINAL HYSTERECTOMY     BILATERAL FOOT SURGERY     BREAST BIOPSY Right 06/07/2020    neg   COLONOSCOPY     EYE SURGERY Left    knot removed from left side of neck     MYOMECTOMY     to remove fibroids    Current Medications: Current Meds  Medication Sig   atorvastatin (LIPITOR) 10 MG tablet Take 10 mg every evening by mouth.   brimonidine -timolol  (COMBIGAN) 0.2-0.5 % ophthalmic solution Apply 1 drop to eye 2 (two) times daily.   cyclobenzaprine  (FLEXERIL ) 10 MG tablet Take 10 mg by mouth at bedtime.  fluticasone (FLONASE) 50 MCG/ACT nasal spray Place 2 sprays into both nostrils daily.    hydroxypropyl methylcellulose / hypromellose (ISOPTO TEARS / GONIOVISC) 2.5 % ophthalmic solution Place 1 drop into both eyes as needed for dry eyes. ARTIFICIAL TEARS.   methylphenidate  (RITALIN ) 10 MG tablet 1 in AM po, prn after lunchtime.   metoprolol  tartrate (LOPRESSOR ) 100 MG tablet Take one tablet 2 hours before cardiac CT for heart greater than 55   naproxen  sodium (ANAPROX ) 220 MG tablet Take 220 mg by mouth daily as needed (for pain). ALEVE .   neomycin -polymyxin-hydrocortisone (CORTISPORIN) OTIC solution Apply one to two drops to toe daily   omeprazole  (PRILOSEC) 20 MG capsule Take 1 capsule (20 mg total) by mouth daily.   venlafaxine XR (EFFEXOR-XR) 75 MG 24 hr capsule Take 75 mg by mouth daily with breakfast.     Allergies:   Patient has no known allergies.   Social History   Socioeconomic History   Marital status: Divorced    Spouse name: Not on file   Number of children: Not on file   Years of education: Not on file   Highest education level: Not on file  Occupational History   Not on file  Tobacco Use   Smoking status: Never   Smokeless tobacco: Never  Substance and Sexual Activity   Alcohol use: Yes    Alcohol/week: 0.0 standard drinks of alcohol    Comment: occasionally   Drug use: No   Sexual activity: Yes    Birth control/protection: Surgical  Other Topics Concern   Not on file  Social History Narrative   Not on file   Social Drivers of Health    Financial Resource Strain: Not on file  Food Insecurity: Not on file  Transportation Needs: Not on file  Physical Activity: Not on file  Stress: Not on file  Social Connections: Unknown (09/15/2021)   [facility]   Social Network    Social Network: Not on file     Family History: The patient's family history includes Breast cancer (age of onset: 34) in her mother. ROS:   Please see the history of present illness.    All 14 point review of systems negative except as described per history of present illness.  EKGs/Labs/Other Studies Reviewed:    The following studies were reviewed today:   EKG:  EKG Interpretation Date/Time:  Wednesday Sep 22 2023 16:03:34 EDT Ventricular Rate:  70 PR Interval:  184 QRS Duration:  72 QT Interval:  394 QTC Calculation: 425 R Axis:   21  Text Interpretation: Normal sinus rhythm Cannot rule out Anterior infarct , age undetermined When compared with ECG of 18-Mar-2017 17:31, No significant change was found Confirmed by Ralene Burger [phone]) on 09/22/2023 4:14:30 PM    Recent Labs: No results found for requested labs within last 365 days.  Recent Lipid Panel No results found for: CHOL, TRIG, HDL, CHOLHDL, VLDL, LDLCALC, LDLDIRECT  Physical Exam:    VS:  BP 122/84   Pulse 70   Ht 5' 5 (1.651 m)   Wt 184 lb 3.2 oz (83.6 kg)   SpO2 99%   BMI 30.65 kg/m     Wt Readings from Last 3 Encounters:  09/22/23 184 lb 3.2 oz (83.6 kg)  02/23/19 163 lb (73.9 kg)  03/07/18 177 lb (80.3 kg)     GEN:  Well nourished, well developed in no acute distress HEENT: Normal NECK: No JVD; No carotid bruits LYMPHATICS: No lymphadenopathy  CARDIAC: RRR, no murmurs, no rubs, no gallops RESPIRATORY:  Clear to auscultation without rales, wheezing or rhonchi  ABDOMEN: Soft, non-tender, non-distended MUSCULOSKELETAL:  No edema; No deformity  SKIN: Warm and dry NEUROLOGIC:  Alert and oriented x 3 PSYCHIATRIC:   Normal affect   ASSESSMENT:    1. Palpitations   2. Precordial pain   3. Atypical chest pain   4. Mixed hyperlipidemia   5. Dizziness    PLAN:    In order of problems listed above:  Palpitations she did wear monitor will wait for results of the monitor before making any adjustment. Atypical chest pain concerning the I will ask her to have coronary CT angio done to make sure she does not have any obstructive disease as well as to see if she get any coronary artery disease. Mixed dyslipidemia will call primary care physician to get a copy of her fasting lipid profile. Dizziness again she wear monitor already will wait for results of it   Medication Adjustments/Labs and Tests Ordered: Current medicines are reviewed at length with the patient today.  Concerns regarding medicines are outlined above.  Orders Placed This Encounter  Procedures   CT CORONARY MORPH W/CTA COR W/SCORE W/CA W/CM &/OR WO/CM   Basic metabolic panel with GFR   Lipid panel   EKG 12-Lead   Meds ordered this encounter  Medications   metoprolol  tartrate (LOPRESSOR ) 100 MG tablet    Sig: Take one tablet 2 hours before cardiac CT for heart greater than 55    Dispense:  1 tablet    Refill:  0    Signed, Manfred Seed, MD, [facility]. 09/22/2023 5:09 PM    [facility]  Addendum generated at 10:07 AM on October 22, 2023  Patient walking to the office today being upset about the fact that she has not been cleared for surgery.  I did review her laboratory test and data that available.  She did have coronary CT angio done which showed no coronary artery disease, she did wear a monitor which showed 1 run of supraventricular tachycardia 16 beats however weight was excessive reaching 226 bpm.  We advised her to start taking small dose of beta-blocker however when she started reading about potential side effect she decided not to take it interestingly side effect that she eventually kind of strange  she read that she cannot eat avocado with medications she also read that she cannot have caffeinated drink with it.  We spent a great of time talking today about that.  She agreed to try metoprolol  succinate 25 mg daily, from cardiac standpoint review she is good to proceed with eye surgery with no reservations.  She will follow-up with me as scheduled.

## 2023-09-29 LAB — BASIC METABOLIC PANEL WITH GFR
BUN/Creatinine Ratio: 16 (ref 12–28)
BUN: 13 mg/dL (ref 8–27)
CO2: 22 mmol/L (ref 20–29)
Calcium: 9.3 mg/dL (ref 8.7–10.3)
Chloride: 106 mmol/L (ref 96–106)
Creatinine, Ser: 0.8 mg/dL (ref 0.57–1.00)
Glucose: 99 mg/dL (ref 70–99)
Potassium: 4.8 mmol/L (ref 3.5–5.2)
Sodium: 140 mmol/L (ref 134–144)
eGFR: 83 mL/min/{1.73_m2} (ref >59)

## 2023-09-29 LAB — LIPID PANEL
Chol/HDL Ratio: 2.5 ratio (ref 0.0–4.4)
Cholesterol, Total: 164 mg/dL (ref 100–199)
HDL: 66 mg/dL (ref >39)
LDL Chol Calc (NIH): 83 mg/dL (ref 0–99)
Triglycerides: 83 mg/dL (ref 0–149)
VLDL Cholesterol Cal: 15 mg/dL (ref 5–40)

## 2023-09-30 ENCOUNTER — Ambulatory Visit: Payer: Self-pay | Admitting: Cardiology

## 2023-10-12 ENCOUNTER — Telehealth: Payer: Self-pay | Admitting: Cardiology

## 2023-10-12 NOTE — Telephone Encounter (Signed)
 Called requesting office to make them aware we are waiting on doctor's recommendation wasn't able to speak to anyone left a detailed vm

## 2023-10-12 NOTE — Telephone Encounter (Signed)
 [place] [facility] office calling for a update before the end of the day. Please advise

## 2023-10-12 NOTE — Telephone Encounter (Signed)
   Pre-operative Risk Assessment    Patient Name: Amber Riley  DOB: 05-17-1960 MRN: [id number]   Date of last office visit: 09/22/23 Date of next office visit: 7/31   Request for Surgical Clearance    Procedure:  rectal surgery for dislocated IOL  Date of Surgery:  Clearance 10/13/23                                Surgeon:  Dr Bretta Camp  Surgeon's Group or Practice Name:  [facility] Phone number:  [phone] Fax number:  [phone]   Type of Clearance Requested:   - Medical    Type of Anesthesia:  MAC   Additional requests/questions:     SignedCaryn Clause   10/12/2023, 8:39 AM

## 2023-10-12 NOTE — Telephone Encounter (Signed)
 Correct Procedure: Vitrectomy (Retinal Surgery)

## 2023-10-13 NOTE — Telephone Encounter (Addendum)
 Amber Riley with Dr. Clyde Darling office called about the clearance request that she sent over on 10/08/23. I stated that I see we received it on 10/12/23. I asked Amber Riley what fax # did she send fax to. Amber Riley replied for the fax# for our [place] office. I stated that we have 7 locations for our cardiologist and I cannot answer to when the request are sent to other office locations. I asked Amber Riley to please keep on file for any future request to please use fax# [phone] attn: preop team. I again greatly apologize.    Amber Riley states the pt's procedure is supposed to be today with Dr. Clyde Darling. Pt is very upset that her surgery is needing to be post poned. I did review notes from yesterday in regard to the clearance. I did first explain that it is NEVER our intent to delay or post pone a surgery or procedure for any pt. I did review notes from DR. Gordan Latina ov notes 09/22/23, pt c/o chest pain and that a Coronary CTA has been ordered and is scheduled for 10/21/23.   Amber Riley said the last message yesterday left for her said that we are waiting on Dr. Gordan Latina input and we will have the clearance for them by the end of the day. I did state that that was an incorrect message left by our CMA who was in preop yesterday afternoon. Amber Riley said if all the CMA left message yesterday stating about the cardiac testing needed they could have cancelled the procedure yesterday. Instead the pt showed up today as planned as Dr. Clyde Darling felt surgery was still on for today. I stated that I completely understand the frustration and that the message was not relayed properly to their office as to what Dr. Gordan Latina ordered and that this would need to be done before the pt can be cleared.   I offer a deep apology to Dr. Clyde Darling and Ms. Schiro.   I did explain to Amber Riley that once the Coronary CTA is done, it will be read by the reader and then Dr. Gordan Latina will read and provide if the pt is then cleared for any procedure/surgery.    I will update the preop APP as FYI only.

## 2023-10-13 NOTE — Telephone Encounter (Signed)
 Patient wants a call back regarding the status of her clearance.

## 2023-10-14 ENCOUNTER — Ambulatory Visit (HOSPITAL_COMMUNITY)
Admission: RE | Admit: 2023-10-14 | Discharge: 2023-10-14 | Disposition: A | Discharge status: Home / Self Care | Source: Ambulatory Visit | Attending: Cardiology | Admitting: Cardiology

## 2023-10-14 DIAGNOSIS — R072 Precordial pain: Secondary | ICD-10-CM | POA: Diagnosis present

## 2023-10-14 MED ORDER — IOHEXOL 350 MG/ML SOLN
100.0000 mL | Freq: Once | INTRAVENOUS | Status: AC | PRN
  Administered 2023-10-14: 100 mL via INTRAVENOUS

## 2023-10-14 MED ORDER — NITROGLYCERIN 0.4 MG SL SUBL
0.8000 mg | SUBLINGUAL_TABLET | Freq: Once | SUBLINGUAL | Status: AC
Start: 2023-10-14 — End: 2023-10-14
  Administered 2023-10-14: 0.8 mg via SUBLINGUAL

## 2023-10-14 MED ORDER — SODIUM CHLORIDE 0.9 % IV BOLUS
250.0000 mL | Freq: Once | INTRAVENOUS | Status: AC
  Administered 2023-10-14: 250 mL via INTRAVENOUS

## 2023-10-14 NOTE — Telephone Encounter (Signed)
 Advised that the test has not been read by the cardiologist and once completed we will notify her.

## 2023-10-14 NOTE — Telephone Encounter (Signed)
 Patient says she had CT today and she would like to make sure results are retrieved and interpreted soon so she can proceed with procedure. She's concerned with having to be out of work longer than anticipated.

## 2023-10-14 NOTE — Telephone Encounter (Signed)
 I will forward this note to preop APP for review if the pt has been cleared.

## 2023-10-15 ENCOUNTER — Telehealth: Payer: Self-pay

## 2023-10-15 MED ORDER — METOPROLOL TARTRATE 25 MG PO TABS: 25.0000 mg | ORAL_TABLET | Freq: Two times a day (BID) | ORAL | 3 refills | Status: DC

## 2023-10-15 NOTE — Telephone Encounter (Signed)
 Spoke with pt. All test completed and available for pre op.

## 2023-10-15 NOTE — Telephone Encounter (Signed)
 Spoke with pt regarding CT Angio, monitor and Lipid results. Pt verbalized understanding and had no further questions. Sent Metoprolol  25mg  Bid to [employer].

## 2023-10-18 NOTE — Telephone Encounter (Signed)
 Dr. Gordan Latina,  You saw this patient on 09/22/2023.  At that time he ordered a coronary CTA for further risk stratification.  The CT was performed on 6/12 and showed a calcium score of 0 with no evidence of CAD.  There was also mention of a heart monitor in your note that I do not see in the system. Per office protocol, will you please comment on medical clearance for retinal surgery?  Please route your response to [facility] Preop. I will communicate with requesting office once you have given recommendations.   Thank you!  Morey Ar, NP

## 2023-10-19 NOTE — Telephone Encounter (Signed)
 Pt c/o medication issue:  1. Name of Medication:  metoprolol  tartrate (LOPRESSOR ) 25 MG tablet  2. How are you currently taking this medication (dosage and times per day)? As prescribed   3. Are you having a reaction (difficulty breathing--STAT)? Yes  4. What is your medication issue? Patient states she does not feel symptoms are severe enough to need to take medications. Reports feeling more fatigued since starting. She has only taken one dose today, and would like to discuss stopping the medication. Please advise.

## 2023-10-20 NOTE — Telephone Encounter (Signed)
 [facility] calling because they are waiting on test result to be faxed to them for pre-op. Please fax to [phone] [place]- [phone]

## 2023-10-20 NOTE — Telephone Encounter (Signed)
 I will forward to preop APP for review if the pt has been cleared and results.

## 2023-10-21 ENCOUNTER — Telehealth: Payer: Self-pay | Admitting: Cardiology

## 2023-10-21 ENCOUNTER — Other Ambulatory Visit (HOSPITAL_BASED_OUTPATIENT_CLINIC_OR_DEPARTMENT_OTHER): Admitting: Radiology

## 2023-10-21 NOTE — Telephone Encounter (Signed)
 Sent a chat to preop APP Lawana Pray waiting on a response regarding patient's clearance.

## 2023-10-21 NOTE — Telephone Encounter (Signed)
 Patient called to follow-up on the status of her clearance and wants to know if the paperwork was sent to [facility].  Patient noted her surgery is scheduled for 6/25.

## 2023-10-21 NOTE — Telephone Encounter (Signed)
 Dr. Krasowski,  Please review question from Morey Ar NP-C and provide recommendations.  Thank you for your help.  Patient has upcoming retinal procedure planned.  Chet Cota. Mariely Mahr NP-C     10/21/2023, 8:53 AM [facility] [address] Office [phone] Fax [phone]

## 2023-10-22 ENCOUNTER — Other Ambulatory Visit: Payer: Self-pay

## 2023-10-22 ENCOUNTER — Telehealth: Payer: Self-pay

## 2023-10-22 MED ORDER — METOPROLOL SUCCINATE ER 25 MG PO TB24: 25.0000 mg | ORAL_TABLET | Freq: Every day | ORAL | 3 refills | Status: AC

## 2023-10-22 NOTE — Telephone Encounter (Signed)
 S/W pt and informed pt that our office had routed the clearance to [employer] [facility] at 10:52 am.  Pt state that she walked in to [employer] and they told her that they had not received the clearance for her procedure on 10/27/23.  Informed pt that I would route the clearance information again to [facility].

## 2023-10-22 NOTE — Telephone Encounter (Signed)
 Office note from 09-22-23 with 6-20- 25 addendum printed for patient and given to her. Note sent to Pre Op as well.

## 2023-10-22 NOTE — Telephone Encounter (Signed)
   Patient Name: Amber Riley  DOB: Oct 18, 1960 MRN: [id number]  Primary Cardiologist: Tandy Fam  Chart reviewed as part of pre-operative protocol coverage. Given past medical history and time since last visit, based on ACC/AHA guidelines, Amber Riley is at acceptable risk for the planned procedure without further cardiovascular testing.   Per [facility] Patient walking to the office today being upset about the fact that she has not been cleared for surgery. I did review her laboratory test and data that available. She did have coronary CT angio done which showed no coronary artery disease, she did wear a monitor which showed 1 run of supraventricular tachycardia 16 beats however weight was excessive reaching 226 bpm. We advised her to start taking small dose of beta-blocker however when she started reading about potential side effect she decided not to take it interestingly side effect that she eventually kind of strange she read that she cannot eat avocado with medications she also read that she cannot have caffeinated drink with it. We spent a great of time talking today about that. She agreed to try metoprolol  succinate 25 mg daily, from cardiac standpoint review she is good to proceed with eye surgery with no reservations.   The patient was advised that if she develops new symptoms prior to surgery to contact our office to arrange for a follow-up visit, and she verbalized understanding.  I will route this recommendation to the requesting party via Epic fax function and remove from pre-op pool.  Please call with questions.  Friddie Jetty, NP 10/22/2023, 10:50 AM

## 2023-12-02 ENCOUNTER — Ambulatory Visit: Attending: Cardiology | Admitting: Cardiology

## 2023-12-02 VITALS — BP 100/68 | HR 87 | Ht 65.0 in | Wt 183.6 lb

## 2023-12-02 DIAGNOSIS — E782 Mixed hyperlipidemia: Secondary | ICD-10-CM | POA: Diagnosis not present

## 2023-12-02 DIAGNOSIS — R0789 Other chest pain: Secondary | ICD-10-CM | POA: Diagnosis not present

## 2023-12-02 DIAGNOSIS — I471 Supraventricular tachycardia, unspecified: Secondary | ICD-10-CM | POA: Diagnosis not present

## 2023-12-02 DIAGNOSIS — F419 Anxiety disorder, unspecified: Secondary | ICD-10-CM

## 2023-12-02 DIAGNOSIS — F32A Depression, unspecified: Secondary | ICD-10-CM

## 2023-12-02 NOTE — Progress Notes (Signed)
 Cardiology Office Note:    Date:  12/02/2023   ID:  Amber Riley, DOB 1960/07/07, MRN [id number]  PCP:  Redmon, Noelle, PA  Cardiologist:  Lamar Fitch, MD    Referring MD: Redmon, Noelle, [place]   Chief Complaint  Patient presents with   Follow-up    History of Present Illness:    Amber Riley is a 63 y.o. female past medical history significant for dyslipidemia, essential hypertension, she was referred to us  because of atypical chest pain as well as dizziness and palpitations.  She did wear a monitor which showed some supraventricular tachycardia, coronary CT angio showed calcium score of 0 and normal coronaries without any evidence of coronary disease, she is coming today to discuss this.  Overall she is doing well.  Since I have seen her last time she says she is feeling better denies having much dizziness.  Interestingly I gave her metoprolol  to help with supraventricular tachycardia even though was asymptomatic and she said she feels better she is much more calm and she is not that anxious.  But she had a long discussion with me about potentially taking this medication she is still debating if she needs to take it or not.  I told her that completely up to her.  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 An informational brochure was offered.  The patient's questions were answered. The potential side effects of the various treatments were reviewed including the   Depressed mood 03/07/2018   Depression    not on any meds   Dislocated intraocular lens, initial encounter 03/27/2020   03/27/2020, during IOL exchange by Dr. 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   Qualifier: Diagnosis of   By: Neysa MD, Venetia      Replacing diagnoses that were inactivated after the 08/03/22 regulatory import     DYSMENORRHEA, SEVERE 06/14/2006   Qualifier: Diagnosis of   By: Neysa MD, Venetia         Excessive daytime sleepiness 03/07/2018   Foreign body of left eye 06/06/2020   2 suture tags exposed from repair will be removed today     FX, ORBITAL FLOOR, OPEN 02/26/2006   Annotation: history of  Qualifier: Diagnosis of   By: Neysa MD, Venetia      Replacing diagnoses that were inactivated after the 08/03/22 regulatory import     GERD (gastroesophageal reflux disease)    occasionally but no meds   HEADACHE 03/02/2007   Qualifier: Diagnosis of   By: Neysa MD, Venetia      Replacing diagnoses that were inactivated after the 08/03/22 regulatory import     History of migraine    last  one a week ago   History of panic attacks    doesn't take meds   HOARSENESS 05/25/2007   Qualifier: Diagnosis of   By: Hope MD, Dorothyann         Hyperlipidemia    was on meds but has been off for yrs-told much improved   LEIOMYOMA, UTERUS 02/26/2006   Annotation: removed in 2003  Qualifier: Diagnosis of   By: Neysa MD, Venetia Edis with sciatica 07/11/2014   Migraine    MIGRAINE HEADACHE 02/26/2006   Qualifier: Diagnosis of   By: Neysa MD, Venetia      Replacing diagnoses that were inactivated after the 08/03/22 regulatory import     Muscle spasm    takes Flexeril  daily as needed   Narcolepsy     was on meds yrs ago;will take Melatonin if needed   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   Annotation: Sleep study on 11/09/05  Qualifier: Diagnosis of   By: Neysa MD, Venetia SCULL MULTINODULAR GOITER 04/14/2007   Qualifier: Diagnosis of   By: Theophilus MD, Estela         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   Pain of both sacroiliac joints 09/21/2023   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   PONV (postoperative nausea and vomiting)    Presbyopia of both eyes 06/01/2016   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   Pseudophakia, left eye 04/23/2020   Radiculitis of right cervical region 08/09/2013   Right lumbar radiculitis 08/09/2013   Rupture of right rotator cuff 09/21/2023   Seasonal allergies    takes Claritin daily   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   Onset secondary glaucoma currently patient, and patient is not using any other prescription medications and specifically is not using a steroid or steroid related medications topically or systemically     Therefore this could be low-grade inflammatory or could be positive Schlossman syndrome or could be pigmentary glaucoma but in any case the intraocular pressure does need to be lowered we will st   Sleep disturbance 03/07/2018   STRESS INCONTINENCE 05/04/2007   Qualifier: Diagnosis of   By: Theophilus MD, Estela         Tinnitus aurium, bilateral 09/13/2018   URI 07/20/2007   Qualifier: Diagnosis of   By: Neysa MD, Venetia      Replacing diagnoses that were inactivated after the 08/03/22 regulatory import     UTI (lower urinary tract infection)    treated in May with antibiotic   Weakness    numbness and tingling down right left    Past Surgical History:  Procedure Laterality Date   ABDOMINAL HYSTERECTOMY     BILATERAL FOOT SURGERY     BREAST BIOPSY Right 06/07/2020   neg   COLONOSCOPY     EYE SURGERY Left    knot removed from left side of neck      MYOMECTOMY     to remove fibroids    Current Medications: Current Meds  Medication Sig   atorvastatin (LIPITOR) 10 MG tablet Take 10 mg every evening by mouth.   brimonidine -timolol  (COMBIGAN) 0.2-0.5 % ophthalmic solution Apply 1 drop to eye 2 (two) times daily.   cyclobenzaprine  (FLEXERIL ) 10 MG tablet Take 10 mg by mouth at bedtime.   fluticasone (FLONASE) 50 MCG/ACT nasal spray Place 2 sprays into both nostrils daily.    hydroxypropyl methylcellulose / hypromellose (  ISOPTO TEARS / GONIOVISC) 2.5 % ophthalmic solution Place 1 drop into both eyes as needed for dry eyes. ARTIFICIAL TEARS.   methylphenidate  (RITALIN ) 10 MG tablet 1 in AM po, prn after lunchtime.   metoprolol  succinate (TOPROL  XL) 25 MG 24 hr tablet Take 1 tablet (25 mg total) by mouth daily.   naproxen  sodium (ANAPROX ) 220 MG tablet Take 220 mg by mouth daily as needed (for pain). ALEVE .   neomycin -polymyxin-hydrocortisone (CORTISPORIN) OTIC solution Apply one to two drops to toe daily   omeprazole  (PRILOSEC) 20 MG capsule Take 1 capsule (20 mg total) by mouth daily.   venlafaxine XR (EFFEXOR-XR) 75 MG 24 hr capsule Take 75 mg by mouth daily with breakfast.     Allergies:   Patient has no known allergies.   Social History   Socioeconomic History   Marital status: Divorced    Spouse name: Not on file   Number of children: Not on file   Years of education: Not on file   Highest education level: Not on file  Occupational History   Not on file  Tobacco Use   Smoking status: Never   Smokeless tobacco: Never  Substance and Sexual Activity   Alcohol use: Yes    Alcohol/week: 0.0 standard drinks of alcohol    Comment: occasionally   Drug use: No   Sexual activity: Yes    Birth control/protection: Surgical  Other Topics Concern   Not on file  Social History Narrative   Not on file   Social Drivers of Health   Financial Resource Strain: Not on file  Food Insecurity: Not on file  Transportation Needs: Not on  file  Physical Activity: Not on file  Stress: Not on file  Social Connections: Unknown (09/15/2021)   Received from [facility]   Social Network    Social Network: Not on file     Family History: The patient's family history includes Breast cancer (age of onset: 80) in her mother. ROS:   Please see the history of present illness.    All 14 point review of systems negative except as described per history of present illness  EKGs/Labs/Other Studies Reviewed:         Recent Labs: 09/28/2023: BUN 13; Creatinine, Ser 0.80; Potassium 4.8; Sodium 140  Recent Lipid Panel    Component Value Date/Time   CHOL 164 09/28/2023 0846   TRIG 83 09/28/2023 0846   HDL 66 09/28/2023 0846   CHOLHDL 2.5 09/28/2023 0846   LDLCALC 83 09/28/2023 0846    Physical Exam:    VS:  BP 100/68 (BP Location: Left Arm, Patient Position: Sitting)   Pulse 87   Ht 5' 5 (1.651 m)   Wt 183 lb 9.6 oz (83.3 kg)   SpO2 95%   BMI 30.55 kg/m     Wt Readings from Last 3 Encounters:  12/02/23 183 lb 9.6 oz (83.3 kg)  09/22/23 184 lb 3.2 oz (83.6 kg)  02/23/19 163 lb (73.9 kg)     GEN:  Well nourished, well developed in no acute distress HEENT: Normal NECK: No JVD; No carotid bruits LYMPHATICS: No lymphadenopathy CARDIAC: RRR, no murmurs, no rubs, no gallops RESPIRATORY:  Clear to auscultation without rales, wheezing or rhonchi  ABDOMEN: Soft, non-tender, non-distended MUSCULOSKELETAL:  No edema; No deformity  SKIN: Warm and dry LOWER EXTREMITIES: no swelling NEUROLOGIC:  Alert and oriented x 3 PSYCHIATRIC:  Normal affect   ASSESSMENT:    1. Supraventricular tachycardia (HCC)   2. Atypical chest  pain   3. Anxiety and depression   4. Mixed hyperlipidemia    PLAN:    In order of problems listed above:  Chest pain atypical she was given some proton pump inhibitor with good improvement, coronary CT angio negative no cardiac issues. Supraventricular tachycardia short episodes.  Will put her on  small dose of beta-blocker she is not sure if she will continue.  She will let me know. Mixed dyslipidemia she is on Lipitor 10 which I will continue HDL 66 LDL 83 from [facility] from 12/30/2023 since calcium score is 0 no coronary artery disease stents appropriate dose.   Medication Adjustments/Labs and Tests Ordered: Current medicines are reviewed at length with the patient today.  Concerns regarding medicines are outlined above.  No orders of the defined types were placed in this encounter.  Medication changes: No orders of the defined types were placed in this encounter.   Signed, Lamar DOROTHA Fitch, MD, [facility] 12/02/2023 1:50 PM    [facility]

## 2023-12-02 NOTE — Patient Instructions (Signed)

## 2024-01-28 ENCOUNTER — Encounter: Payer: Self-pay | Admitting: Physician Assistant

## 2024-04-25 ENCOUNTER — Encounter: Payer: Self-pay | Admitting: Neurology

## 2024-06-01 ENCOUNTER — Other Ambulatory Visit: Payer: Self-pay

## 2024-06-01 ENCOUNTER — Ambulatory Visit: Admitting: Neurology

## 2024-06-01 DIAGNOSIS — R202 Paresthesia of skin: Secondary | ICD-10-CM | POA: Diagnosis not present

## 2024-06-01 NOTE — Procedures (Signed)
 " [facility]  [address] [place], [address]  [place], [place] [address] Tel: [phone] Fax: [phone] Test Date:  06/01/2024  Patient: Amber Riley DOB: Jun 13, 1960 Physician: Tonita Blanch, DO  Sex: Female Height: 5' 5 Ref Phys: Rockey Peru, MD  ID#: [id number]   Technician:    History: This is a 64 year old female referred for evaluation of low back pain and bilateral leg pain.  NCV & EMG Findings: Electrodiagnostic testing of the right lower extremity and additional studies of the left shows: Bilateral sural and superficial peroneal sensory responses are within normal limits. Bilateral peroneal and tibial motor responses are within normal limits. Bilateral tibial H reflex studies are within normal limits. There is no evidence of active or chronic motor axonal changes affecting any of the tested muscles.  Motor unit configuration and recruitment pattern is within normal limits.  Impression: This is a normal study of the lower extremities.  In particular, there is no evidence of a large fiber sensorimotor polyneuropathy or lumbosacral radiculopathy.   ___________________________ Tonita Blanch, DO    Nerve Conduction Studies   Stim Site NR Peak (ms) Norm Peak (ms) O-P Amp (V) Norm O-P Amp  Left Sup Peroneal Anti Sensory (Ant Lat Mall)  32 C  12 cm    2.6 <4.6 14.3 >3  Right Sup Peroneal Anti Sensory (Ant Lat Mall)  32 C  12 cm    2.2 <4.6 11.7 >3  Left Sural Anti Sensory (Lat Mall)  32 C  Calf    2.8 <4.6 32.3 >3  Right Sural Anti Sensory (Lat Mall)  32 C  Calf    2.3 <4.6 33.0 >3     Stim Site NR Onset (ms) Norm Onset (ms) O-P Amp (mV) Norm O-P Amp Site1 Site2 Delta-0 (ms) Dist (cm) Vel (m/s) Norm Vel (m/s)  Left Peroneal Motor (Ext Dig Brev)  32 C  Ankle    3.6 <6.0 6.1 >2.5 B Fib Ankle 7.2 37.0 51 >40  B Fib    10.8  5.8  Poplt B Fib 1.4 9.0 64 >40  Poplt    12.2  5.8         Right Peroneal Motor (Ext Dig Brev)  32 C  Ankle    3.0 <6.0 6.2  >2.5 B Fib Ankle 7.1 39.0 55 >40  B Fib    10.1  5.7  Poplt B Fib 1.5 9.0 60 >40  Poplt    11.6  5.4         Left Tibial Motor (Abd Hall Brev)  32 C  Ankle    3.4 <6.0 12.9 >4 Knee Ankle 9.3 42.0 45 >40  Knee    12.7  9.0         Right Tibial Motor (Abd Hall Brev)  32 C  Ankle    4.4 <6.0 14.3 >4 Knee Ankle 9.0 41.0 46 >40  Knee    13.4  7.6          Electromyography   Side Muscle Ins.Act Fibs Fasc Recrt Amp Dur Poly Activation Comment  Right AntTibialis Nml Nml Nml Nml Nml Nml Nml Nml N/A  Right Gastroc Nml Nml Nml Nml Nml Nml Nml Nml N/A  Right Flex Dig Long Nml Nml Nml Nml Nml Nml Nml Nml N/A  Right RectFemoris Nml Nml Nml Nml Nml Nml Nml Nml N/A  Right BicepsFemS Nml Nml Nml Nml Nml Nml Nml Nml N/A  Right GluteusMed Nml Nml Nml Nml Nml Nml Nml Nml  N/A  Left AntTibialis Nml Nml Nml Nml Nml Nml Nml Nml N/A  Left Gastroc Nml Nml Nml Nml Nml Nml Nml Nml N/A  Left Flex Dig Long Nml Nml Nml Nml Nml Nml Nml Nml N/A  Left RectFemoris Nml Nml Nml Nml Nml Nml Nml Nml N/A  Left GluteusMed Nml Nml Nml Nml Nml Nml Nml Nml N/A      Waveforms:                         "

## 2024-07-07 ENCOUNTER — Encounter: Payer: Self-pay | Admitting: Neurology
